# Patient Record
Sex: Female | Born: 1940
Health system: Southern US, Community
[De-identification: ages and names within clinical notes are randomized; demographics above are authoritative.]

## PROBLEM LIST (undated history)

## (undated) DIAGNOSIS — N183 Chronic kidney disease, stage 3 unspecified: Secondary | ICD-10-CM

## (undated) DIAGNOSIS — I10 Essential (primary) hypertension: Secondary | ICD-10-CM

## (undated) DIAGNOSIS — N189 Chronic kidney disease, unspecified: Secondary | ICD-10-CM

## (undated) DIAGNOSIS — T7840XA Allergy, unspecified, initial encounter: Secondary | ICD-10-CM

## (undated) DIAGNOSIS — E785 Hyperlipidemia, unspecified: Secondary | ICD-10-CM

## (undated) DIAGNOSIS — E538 Deficiency of other specified B group vitamins: Secondary | ICD-10-CM

## (undated) DIAGNOSIS — Z955 Presence of coronary angioplasty implant and graft: Secondary | ICD-10-CM

## (undated) DIAGNOSIS — R112 Nausea with vomiting, unspecified: Secondary | ICD-10-CM

## (undated) DIAGNOSIS — J45909 Unspecified asthma, uncomplicated: Secondary | ICD-10-CM

## (undated) DIAGNOSIS — R194 Change in bowel habit: Secondary | ICD-10-CM

## (undated) DIAGNOSIS — T4145XA Adverse effect of unspecified anesthetic, initial encounter: Secondary | ICD-10-CM

## (undated) DIAGNOSIS — I480 Paroxysmal atrial fibrillation: Secondary | ICD-10-CM

## (undated) DIAGNOSIS — G43909 Migraine, unspecified, not intractable, without status migrainosus: Secondary | ICD-10-CM

## (undated) DIAGNOSIS — I219 Acute myocardial infarction, unspecified: Secondary | ICD-10-CM

## (undated) DIAGNOSIS — I209 Angina pectoris, unspecified: Secondary | ICD-10-CM

## (undated) DIAGNOSIS — D649 Anemia, unspecified: Secondary | ICD-10-CM

## (undated) DIAGNOSIS — M1990 Unspecified osteoarthritis, unspecified site: Secondary | ICD-10-CM

## (undated) DIAGNOSIS — Z9889 Other specified postprocedural states: Secondary | ICD-10-CM

## (undated) DIAGNOSIS — R0600 Dyspnea, unspecified: Secondary | ICD-10-CM

## (undated) DIAGNOSIS — J189 Pneumonia, unspecified organism: Secondary | ICD-10-CM

## (undated) DIAGNOSIS — I2510 Atherosclerotic heart disease of native coronary artery without angina pectoris: Secondary | ICD-10-CM

## (undated) DIAGNOSIS — K219 Gastro-esophageal reflux disease without esophagitis: Secondary | ICD-10-CM

## (undated) DIAGNOSIS — T8859XA Other complications of anesthesia, initial encounter: Secondary | ICD-10-CM

## (undated) HISTORY — PX: TOTAL ABDOMINAL HYSTERECTOMY W/ BILATERAL SALPINGOOPHORECTOMY: SHX83

## (undated) HISTORY — DX: Anemia, unspecified: D64.9

## (undated) HISTORY — DX: Allergy, unspecified, initial encounter: T78.40XA

## (undated) HISTORY — DX: Deficiency of other specified B group vitamins: E53.8

## (undated) HISTORY — PX: CHOLECYSTECTOMY: SHX55

## (undated) HISTORY — DX: Migraine, unspecified, not intractable, without status migrainosus: G43.909

## (undated) HISTORY — DX: Gastro-esophageal reflux disease without esophagitis: K21.9

## (undated) HISTORY — DX: Unspecified osteoarthritis, unspecified site: M19.90

## (undated) HISTORY — DX: Essential (primary) hypertension: I10

## (undated) HISTORY — DX: Presence of coronary angioplasty implant and graft: Z95.5

## (undated) HISTORY — DX: Change in bowel habit: R19.4

## (undated) HISTORY — DX: Hyperlipidemia, unspecified: E78.5

## (undated) HISTORY — PX: NASAL SINUS SURGERY: SHX719

---

## 1999-07-07 HISTORY — PX: OTHER SURGICAL HISTORY: SHX169

## 1999-09-17 ENCOUNTER — Encounter: Payer: Self-pay | Admitting: Neurosurgery

## 1999-09-19 ENCOUNTER — Inpatient Hospital Stay (HOSPITAL_COMMUNITY): Admission: RE | Admit: 1999-09-19 | Discharge: 1999-09-23 | Payer: Self-pay | Admitting: Neurosurgery

## 1999-11-04 ENCOUNTER — Encounter: Admission: RE | Admit: 1999-11-04 | Discharge: 1999-11-04 | Payer: Self-pay | Admitting: Neurosurgery

## 1999-11-04 ENCOUNTER — Encounter: Payer: Self-pay | Admitting: Neurosurgery

## 1999-11-11 ENCOUNTER — Encounter: Admission: RE | Admit: 1999-11-11 | Discharge: 1999-11-11 | Payer: Self-pay | Admitting: Neurosurgery

## 1999-11-11 ENCOUNTER — Encounter: Payer: Self-pay | Admitting: Neurosurgery

## 1999-11-28 ENCOUNTER — Encounter: Admission: RE | Admit: 1999-11-28 | Discharge: 1999-11-28 | Payer: Self-pay | Admitting: Neurosurgery

## 1999-11-28 ENCOUNTER — Encounter: Payer: Self-pay | Admitting: Neurosurgery

## 1999-12-09 ENCOUNTER — Other Ambulatory Visit: Admission: RE | Admit: 1999-12-09 | Discharge: 1999-12-09 | Payer: Self-pay | Admitting: Obstetrics and Gynecology

## 2000-07-30 ENCOUNTER — Ambulatory Visit (HOSPITAL_BASED_OUTPATIENT_CLINIC_OR_DEPARTMENT_OTHER): Admission: RE | Admit: 2000-07-30 | Discharge: 2000-07-30 | Payer: Self-pay | Admitting: Otolaryngology

## 2000-10-08 ENCOUNTER — Ambulatory Visit (HOSPITAL_BASED_OUTPATIENT_CLINIC_OR_DEPARTMENT_OTHER): Admission: RE | Admit: 2000-10-08 | Discharge: 2000-10-08 | Payer: Self-pay | Admitting: Otolaryngology

## 2000-12-09 ENCOUNTER — Other Ambulatory Visit: Admission: RE | Admit: 2000-12-09 | Discharge: 2000-12-09 | Payer: Self-pay | Admitting: Obstetrics and Gynecology

## 2002-01-18 ENCOUNTER — Other Ambulatory Visit: Admission: RE | Admit: 2002-01-18 | Discharge: 2002-01-18 | Payer: Self-pay | Admitting: Obstetrics and Gynecology

## 2004-08-28 ENCOUNTER — Ambulatory Visit: Payer: Self-pay | Admitting: Family Medicine

## 2004-10-24 ENCOUNTER — Ambulatory Visit: Payer: Self-pay | Admitting: Family Medicine

## 2004-11-11 ENCOUNTER — Ambulatory Visit: Payer: Self-pay | Admitting: Family Medicine

## 2004-12-23 ENCOUNTER — Ambulatory Visit: Payer: Self-pay | Admitting: Family Medicine

## 2005-04-21 ENCOUNTER — Ambulatory Visit: Payer: Self-pay | Admitting: Family Medicine

## 2005-05-14 ENCOUNTER — Encounter: Admission: RE | Admit: 2005-05-14 | Discharge: 2005-05-14 | Payer: Self-pay | Admitting: Neurology

## 2007-07-07 HISTORY — PX: COLONOSCOPY: SHX174

## 2007-10-04 ENCOUNTER — Ambulatory Visit: Payer: Self-pay | Admitting: Gastroenterology

## 2007-10-19 ENCOUNTER — Ambulatory Visit: Payer: Self-pay | Admitting: Gastroenterology

## 2010-11-18 NOTE — Assessment & Plan Note (Signed)
[facility]                         GASTROENTEROLOGY OFFICE NOTE   NAME:Krystal Mcpherson, Krystal Mcpherson                          MRN:          [id number]  DATE:10/04/2007                            DOB:          [birth year]    REFERRING PHYSICIAN:  Lindaann Pascal PA-C   Tenee Wish REQUESTING CONSULT:  Lindaann Pascal, PA-C   REASON FOR CONSULTATION:  Lower abdominal discomfort.   HISTORY OF PRESENT ILLNESS:  This is a 70 year old white female who  relates worsening problems with lower abdominal pain and pressure that  began approximately one month ago.  She states her symptoms worsened  when she had a full urinary bladder and she also noted nausea.  She was  treated with a course of Septra for 10 days for suspected  diverticulitis, and her symptoms have substantially improved.  She had  constipation at the time, and this resolved quite rapidly.  She relates  no fevers, chills, melena, hematochezia, or change in stool caliber.  Blood work from February 2009 revealed a normal CBC and chemistry panel  except for a slightly low glomerular filtration rate of 56.  She relates  a grandmother, two first cousins, an aunt and one second cousin, all on  the paternal side of her family, all with colon cancer.  Her mother has  had colon polyps.  She states she had a colonoscopy performed greater  than 10 years ago at [facility], and she is unsure of the  results.   PAST MEDICAL HISTORY:  1. Hypertension.  2. PAT.  3. Allergic rhinitis.  4. Left sphenoid sinus fungal infection.  5. Migraine headaches.   PAST SURGICAL HISTORY:  Status post total abdominal hysterectomy for  fibroids in 1988 status post cholecystectomy in 1995, status post  varicose vein stripping in 2008. Chiari I decompression   CURRENT MEDICATIONS:  Listed on the chart, updated and reviewed.   MEDICATION ALLERGIES:  MORPHINE LEADING TO HALLUCINATIONS, NAUSEA AND  VOMITING.   SOCIAL HISTORY/REVIEW OF SYSTEMS:   Per the handwritten form.   PHYSICAL EXAMINATION:  Overweight white female in no acute distress.  Height 5 feet, 7 inches, weight 211.2 pounds.  Blood pressure 144/74,  pulse 56 and regular.  HEENT EXAM:  Anicteric sclerae, oropharynx clear.  CHEST:  Clear to auscultation bilaterally.  CARDIAC:  Regular rate and rhythm without murmurs.  ABDOMEN:  Soft, nontender, nondistended, normoactive bowel sounds.  No  palpable organomegaly, masses or hernias.  RECTAL:  Deferred to colonoscopy.  EXTREMITIES:  No clubbing, cyanosis or edema.  NEUROLOGIC:  Alert and oriented x3.  Grossly nonfocal.   ASSESSMENT/PLAN:  Lower abdominal pain associated with transient  constipation.  She has a strong family history of colon cancer. Since  her symptoms resolved with a course of antibiotics, she may have had  diverticulitis.  Need to exclude colorectal neoplasms.  The risks,  benefits and alternatives to colonoscopy, possible biopsy and possible  polypectomy were discussed with the patient.  She consents to proceed.  This will be scheduled electively.  She is advised to maintain a high-  fiber diet with adequate  fluid intake.     Venita Lick. Russella Dar, MD, [facility]  Electronically Signed    MTS/MedQ  DD: 10/06/2007  DT: 10/06/2007  Job #: [id number]   cc:   Lindaann Pascal PA-C

## 2010-11-21 NOTE — Op Note (Signed)
[place]. [facility]  Patient:    Krystal Mcpherson, Krystal Mcpherson                          MRN: [id number] Proc. Date: 08/26/00 Adm. Date:  [id number] Disc. Date: [id number] Attending:  Lucky Cowboy             Colon Flattery, D.O.   Operative Report  PREOPERATIVE DIAGNOSIS:  Chronic left sphenoid sinusitis, left concha bullosa, septal deviation.  POSTOPERATIVE DIAGNOSIS:  Chronic left sphenoid sinusitis, left concha bullosa, septal deviation.  OPERATION PERFORMED:  Reduction of left concha bullosa, septoplasty, left sphenoidotomy, Insta-Trak guidance.  SURGEON:  Lucky Cowboy, M.D.  ANESTHESIA:  General endotracheal.  ESTIMATED BLOOD LOSS:  30 cc.  SPECIMENS:  None.  COMPLICATIONS:  None.  INDICATIONS FOR PROCEDURE:  The patient is a 70 year old female who has had some issues with recurrent sinusitis.  CT scan reveals thickening in the inferior portion of the left sphenoid sinus with bony sclerosis.  Concern is for possible retained fungal elements.  There is also concern for chronic sphenoid sinusitis.  Since this has failed to resolved with numerous courses of antibiotic as well as steroid therapy, left sphenoidotomy is performed. The patient also has a very severe left septal deviation which does not permit visualization and manipulation of the sphenoid rostrum.  She was also noted to have a prominent left concha bullosa which also did not permit visualization in the area as well.  For this reason, it was taken down.  FINDINGS:  The patient was noted to have marked mucosal thickening at the base of the left sphenoid sinus.  There was no frank pus encountered.  There was a concha bullosa in the left middle turbinate.  The septum was deviated to the superior and midportion to the left side.  DESCRIPTION OF PROCEDURE:  The patient was taken to the operating room and placed on the table in the supine position.  She was then placed under general endotracheal anesthesia  and the table rotated counterclockwise 90 degrees. The Insta-Trak head gear was applied.  The face was prepped with Betadine and draped in the usual sterile fashion.  Each nasal cavity was decongested with Afrin on cottonoid sponges.  The Insta-Trak was then calibrated.  The septum was injected with 1% lidocaine with 1:100,000 epinephrine in the mucoperichondrial and mucoperiosteal planes.  After allowing time for vasoconstriction, a left hemitransfixion incision was made with a #15 blade. Submucopericondrial and mucoperiosteal flaps were elevated ipsilaterally and the septum divided.  The bony cartilaginous junction and a contralateral mucoperiosteal flap elevated using Therapist, nutritional.  The septum was divided high using a Jansen-Middleton forceps and taken down with duckbill forceps in the midportion.  This resected the posterior portion of quadrangular cartilage and anterior portion of vomerine perpendicular plate of the ethmoid.  This allowed visualization of the sphenoid and took down the deviated portion of the septum.  At this point the concha bullosa was taken down after injecting with 1% lidocaine with 1:100,000 epinephrine.  The middle turbinate was then divided in a vertical fashion along the head.  The concha bullosa was entered in the lateral portion and taken down.  This allowed lateralization of the middle turbinate leaving still a good air space between that and the natural maxillary ostia.  The posterior portion of the septum and sphenoid face was then injected with the same local anesthetic.  The natural ostia could not be clearly identified.  Aneta Mins  was used to ensure location into the expected portion of the sphenoid sinus.  The sphenoid sinus was then entered using a suction and a microdebrider and backbiting forceps used to enlarge the natural ostia.   The small amount of fluid in the sinus was then suctioned. Inspection of the sinus was performed using 0 and 30  degree scopes.  Sphenoid osti was widely enlarged.  At this point, the hemitransfixion incision was reapproximated in a simple interrupted fashion, using 4-0 chromic.  A mattress stitch was placed using 4-0 chromic as well.  This was performed in a mattress type fashion leaving a significant amount of mucosa between stitches to ensure mucosal viability.  Bactroban ointment was placed into the left sphenoid sinus.  Packs were placed in the spheno-ethmoid recess.  This was coated with Bactroban ointment.  The nasopharynx and oropharynx was copiously irrigated. The table was rotated clockwise 90 degrees to its original position.  The patient was awakened from anesthesia and extubated in the operating room.  She was taken to the post anesthesia care unit in stable condition.  There were no complications.  Return appointment tomorrow morning for pack removal.  DISCHARGE MEDICATIONS:  Ceftin 250 mg b.i.d. x 10 days and Darvocet-N 100 1 to 2 p.o. q.4-6h. p.r.n. pain. DD:  08/26/00 TD:  08/26/00 Job: [id number]

## 2010-11-21 NOTE — Op Note (Signed)
[place]. [facility]  Patient:    Krystal Mcpherson, Krystal Mcpherson                          MRN: [id number] Proc. Date: 10/08/00 Adm. Date:  [id number] Disc. Date: [id number] Attending:  Gerilyn Pilgrim, Sera                           Operative Report  PREOPERATIVE DIAGNOSIS:  Chronic left sphenoid sinusitis.  POSTOPERATIVE DIAGNOSIS:  Chronic left sphenoid sinusitis.  PROCEDURE:  Debridement of left sphenoid sinus with removal of fungal mass.  SURGEON:  Lucky Cowboy, M.D.  ANESTHESIA:  General endotracheal anesthesia.  ESTIMATED BLOOD LOSS:  10 cc.  SPECIMENS:  None.  COMPLICATIONS:  None.  INDICATIONS:  The patient is a 70 year old female who has previously undergone left sphenoidotomy for chronic fungal sphenoid sinusitis.  The patient initially improved; however, she developed sinusitis again.  Inspection in the office revealed a fungal element very posterior and inferior in the sphenoid sinus, which could not be accessed and cleaned out adequately without general anesthesia and debridement, taking down the anterior sphenoid face.  DESCRIPTION OF PROCEDURE:  The patient was taken to the operating room and placed on the table in supine position.  She was then placed under general endotracheal anesthesia and the table rotated counterclockwise 90 degrees. The left nasal cavity was decongested with Afrin and lidocaine.  Lidocaine 1% with 1:100,000 of epinephrine was used to inject the left sphenoid face.  At this point, the [place] back-biting forceps and the microdebrider were used to take down an inferior lip of the sphenoidotomy site.  This allowed retraction of a large, hard fungal ball in the posterior inferior portion of the sphenoid sinus.  There was moderate edema and some mucus in the left sphenoid sinus.  The left sphenoid sinus was then packed full of Bactroban ointment.  The table was rotated clockwise 90 degrees to its original position.  The patient was awakened from  anesthesia, taken to the postanesthesia care unit in stable condition. DD:  11/10/00 TD:  11/11/00 Job: [id number]

## 2010-11-21 NOTE — Op Note (Signed)
[place]. [facility]  Patient:    Krystal Mcpherson, Krystal Mcpherson                          MRN: [id number] Proc. Date: 09/19/99 Adm. Date:  [id number] Attending:  Emeterio Reeve                           Operative Report  PREOPERATIVE DIAGNOSIS:  Chiari type I malformation.  POSTOPERATIVE DIAGNOSIS:  Chiari type I malformation.  OPERATIVE PROCEDURE:  Chiari type I decompression.  SURGEON:  Payton Doughty, M.D.  ANESTHESIA:  General endotracheal.  PREPARATION:  Prepped with sterile Betadine, prepped and scrubbed with alcohol wipe.  COMPLICATIONS:  None.  INDICATIONS:  A 70 year old right-handed white lady with Chiari I malformation.  DESCRIPTION OF PROCEDURE:  The patient was taken to the operating room, smoothly anesthetized and intubated, and placed prone on the operating table in the [place] head holder.  Following shave, prep, and drape in the usual sterile fashion, the skin was infiltrated with 1% lidocaine with 1:400,000 epinephrine.  A skin incision was made from the ________.  The occipital bone in the posterior fossa, as well as the ring of C1 and the lamina of C2 were exposed.  There was no real suitable fascia for grafting, so there was no fascia taken.  The ring of C1 and the occipital bone around the foramen magnum was removed, the foramen magnum opened.  A tight constrictive ring of ligamentum flavum was found under the ring of C1, and that was removed without difficulty.  The dura was then opened.  Having opened he dura, the cerebellar tonsils were immediately obvious, and careful exploration nto the _______ revealed good egress of spinal fluid.  Duraplasty was then performed using the _______ on pericardium.  It was sewn into place with 4-0 Nurolon.  Having completed the duraplasty, it was found to be watertight to Valsalva. The fascia was then reapproximated with 0 Vicryl in an interrupted fashion. Subcutaneous tissue was reapproximated with  0 Vicryl, subcuticular tissues reapproximated with 3-0 Vicryl in an interrupted fashion, and the skin was closed with 3-0 nylon in a running locked fashion.  Betadine Telfa dressing was applied and made occlusive with Op-Site.  The patient was returned to the recovery room in good condition. DD:  09/19/99 TD:  09/21/99 Job: [id number]

## 2010-11-21 NOTE — Discharge Summary (Signed)
[place]. [facility]  Patient:    Krystal Mcpherson, Krystal Mcpherson                          MRN: [id number] Adm. Date:  [id number] Disc. Date: [id number] Attending:  Emeterio Reeve                           Discharge Summary  NO DICTATION DD:  09/23/99 TD:  09/23/99 Job: [id number]

## 2010-11-21 NOTE — Discharge Summary (Signed)
[place]. [facility]  Patient:    Krystal Mcpherson, Krystal Mcpherson                          MRN: [id number] Proc. Date: 09/23/99 Adm. Date:  [id number] Disc. Date: [id number] Attending:  Emeterio Reeve                           Discharge Summary  ADMITTING DIAGNOSIS:  Chiari Type I malformation.  DISCHARGE DIAGNOSIS:  Chiari Type I malformation.  OPERATIVE PROCEDURE:  Posterior fossa craniectomy and duraplasty for Chiari decompression.  SURGEON:  Payton Doughty, M.D.  COMPLICATIONS:  None.  DISCHARGE:  ______ .  BODY OF TEXT:  A 70 year old right-handed, white lady, whose history and physical is recounted in the chart.  She has had headaches for a while and neck pain.  MRI showed a Chiari Type I malformation.  Her examination was intact save for circumoral numbness.  She was admitted after ascertaining normal laboratory values and underwent a Chiari decompression. Postoperatively, she did well.  She had nausea and vomiting for several days as is common.  On the third postoperative day, she was transferred to the floor, awake, alert and oriented.  She had one headache, which resolved.  She is now awake, alert and oriented.  Her cranial nerves are intact.  Motor examination is intact and she is eating and voiding normally.  She is being discharged home to the care of her family.  She is taking Percocet for pain, as well as, her admitting medicines. DD:  09/23/99 TD:  09/23/99 Job: [id number]

## 2011-05-07 ENCOUNTER — Inpatient Hospital Stay (HOSPITAL_BASED_OUTPATIENT_CLINIC_OR_DEPARTMENT_OTHER)
Admission: RE | Admit: 2011-05-07 | Discharge: 2011-05-07 | Disposition: A | Discharge status: Hospice | Payer: Medicare Other | Source: Ambulatory Visit | Attending: Cardiology | Admitting: Cardiology

## 2011-05-07 ENCOUNTER — Ambulatory Visit (HOSPITAL_COMMUNITY)
Admission: RE | Admit: 2011-05-07 | Discharge: 2011-05-08 | Disposition: A | Discharge status: Home / Self Care | Payer: Medicare Other | Source: Ambulatory Visit | Attending: Interventional Cardiology | Admitting: Interventional Cardiology

## 2011-05-07 DIAGNOSIS — R9439 Abnormal result of other cardiovascular function study: Secondary | ICD-10-CM | POA: Insufficient documentation

## 2011-05-07 DIAGNOSIS — I10 Essential (primary) hypertension: Secondary | ICD-10-CM | POA: Insufficient documentation

## 2011-05-07 DIAGNOSIS — I2510 Atherosclerotic heart disease of native coronary artery without angina pectoris: Secondary | ICD-10-CM | POA: Insufficient documentation

## 2011-05-07 DIAGNOSIS — Z8711 Personal history of peptic ulcer disease: Secondary | ICD-10-CM | POA: Insufficient documentation

## 2011-05-07 DIAGNOSIS — Z7982 Long term (current) use of aspirin: Secondary | ICD-10-CM | POA: Insufficient documentation

## 2011-05-07 DIAGNOSIS — Z79899 Other long term (current) drug therapy: Secondary | ICD-10-CM | POA: Insufficient documentation

## 2011-05-07 HISTORY — PX: OTHER SURGICAL HISTORY: SHX169

## 2011-05-07 LAB — POCT ACTIVATED CLOTTING TIME: Activated Clotting Time: 474 seconds

## 2011-05-08 ENCOUNTER — Other Ambulatory Visit: Payer: Self-pay

## 2011-05-08 LAB — CBC
HCT: 34.4 % — ABNORMAL LOW (ref 36.0–46.0)
Hemoglobin: 11 g/dL — ABNORMAL LOW (ref 12.0–15.0)
MCH: 27 pg (ref 26.0–34.0)
MCHC: 32 g/dL (ref 30.0–36.0)
MCV: 84.5 fL (ref 78.0–100.0)
Platelets: 157 10*3/uL (ref 150–400)
RBC: 4.07 MIL/uL (ref 3.87–5.11)
RDW: 13.2 % (ref 11.5–15.5)
WBC: 4.5 10*3/uL (ref 4.0–10.5)

## 2011-05-08 LAB — BASIC METABOLIC PANEL
BUN: 13 mg/dL (ref 6–23)
CO2: 27 mEq/L (ref 19–32)
Calcium: 8.8 mg/dL (ref 8.4–10.5)
Chloride: 103 mEq/L (ref 96–112)
Creatinine, Ser: 0.8 mg/dL (ref 0.50–1.10)
GFR calc Af Amer: 86 mL/min — ABNORMAL LOW (ref >90)
GFR calc non Af Amer: 75 mL/min — ABNORMAL LOW (ref >90)
Glucose, Bld: 104 mg/dL — ABNORMAL HIGH (ref 70–99)
Potassium: 3.5 mEq/L (ref 3.5–5.1)
Sodium: 140 mEq/L (ref 135–145)

## 2011-05-08 NOTE — Cardiovascular Report (Signed)
  NAMEMarland Kitchen  SAMANTHA, RAGEN NO.:  [id number]  MEDICAL RECORD NO.:  [id number]  LOCATION:  2505                         FACILITY:  [facility]  PHYSICIAN:  Corky Crafts, MDDATE OF BIRTH:  1941/01/03  DATE OF PROCEDURE:  05/07/2011 DATE OF DISCHARGE:                           CARDIAC CATHETERIZATION   PRIMARY CARDIOLOGIST:  Jake Bathe, MD  PRIMARY CARE PHYSICIAN:  Kandyce Rud, MD.  PROCEDURE PERFORMED:  PCI of the right coronary artery.  OPERATORS:  Corky Crafts, MD.  INDICATIONS:  Abnormal stress test, severe coronary artery disease.  PROCEDURE NARRATIVE:  The diagnostic catheterization was done by Dr. Anne Fu showing a 99% RCA lesion with TIMI 1 flow.  A JR-4 guiding catheter was used.  Angiomax was used for anticoagulation.  Prowater wire was used to cross the lesion.  A 2.0 x 15 Emerge balloon was used to predilate the lesion.  There was a more distal area of moderate stenosis.  This stenosis resolved with IC Nitroglycerin administration through the catheter. A 2.5 x 20 Promus drug-eluting stent was used to stent the previously angioplastied area, it was deployed at 18 atmospheres.  The stent was post dilated with 3.25 x 12 [place] Sprinter balloon inflated at 16 atmospheres in the mid portion of the stent and 18 atmospheres in the proximal portion.  There is no residual stenosis. Initial TIMI 1 flow improved to TIMI-3 flow.  IMPRESSION:  Successful percutaneous coronary intervention to the right coronary artery with a drug-eluting stent.  RECOMMENDATIONS:  Continue aspirin and Plavix for at least a year, unless there is a contraindication.  She will also be started on a proton pump inhibitor.  She will also be started on a statin.     Corky Crafts, MD     JSV/MEDQ  D:  05/07/2011  T:  05/07/2011  Job:  [id number]  Electronically Signed by Lance Muss MD on 05/08/2011 05:39:06 PM

## 2011-05-08 NOTE — Discharge Summary (Signed)
  Krystal Mcpherson, Krystal Mcpherson NO.:  [id number]  MEDICAL RECORD NO.:  [id number]  LOCATION:  2505                         FACILITY:  [facility]  PHYSICIAN:  Corky Crafts, MDDATE OF BIRTH:  Apr 13, 1941  DATE OF ADMISSION:  05/07/2011 DATE OF DISCHARGE:  05/08/2011                              DISCHARGE SUMMARY   PRIMARY CARDIOLOGIST:  Jake Bathe, MD  PRIMARY CARE PHYSICIAN:  Kandyce Rud, MD  FINAL DIAGNOSES: 1. Coronary artery disease. 2. Hypertension. 3. Prior gastric ulcer.  PROCEDURES PERFORMED:  Cardiac catheterization with drug-eluting stent implantation to the right coronary artery.  HOSPITAL COURSE:  The patient underwent diagnostic catheterization by Dr. Anne Fu.  This revealed a 99% subtotal occlusion of the mid right coronary artery.  There were left-to-right collaterals.  She underwent percutaneous intervention and had a drug-eluting stent placed into the right coronary artery successfully.  She tolerated the procedure well. She had no significant groin bleeding issues.  There was a small bruise on the morning after the procedure.  She walked with Cardiac rehab and had no difficulties.  Her lab work was stable.  DISCHARGE MEDICATIONS: 1. Tylenol 650 mg p.o. q.4 h. p.r.n. 2. Aspirin 325 mg p.o. daily. 3. Clopidogrel 75 mg p.o. daily. 4. Pantoprazole 40 mg p.o. daily. 5. Simvastatin 20 mg p.o. daily. 6. Atenolol 25 mg daily. 7. Calcium carbonate twice daily. 8. Cetirizine 10 mg daily. 9. Losartan/hydrochlorothiazide 50/12.5 one tablet daily.  ACTIVITY:  Increase activity slowly.  No lifting more than 10 pounds for a week.  DIET:  Low-sodium, heart healthy diet.  FOLLOWUP:  Followup appointments with Dr. Anne Fu and Cristopher Peru on May 18, 2011, at 9:45 a.m.  She is to call our office if she has any chest discomfort or problems with her groin.     Corky Crafts, MD     JSV/MEDQ  D:  05/08/2011  T:  05/08/2011  Job:   [id number]  cc:   Jake Bathe, MD Kandyce Rud, MD  Electronically Signed by Lance Muss MD on 05/08/2011 05:39:11 PM

## 2011-05-13 NOTE — Cardiovascular Report (Signed)
NAMEMarland Kitchen  Mcpherson, Krystal NO.:  [id number]  MEDICAL RECORD NO.:  [id number]  LOCATION:  2505                         FACILITY:  [facility]  PHYSICIAN:  Jake Bathe, MD      DATE OF BIRTH:  1940-12-20  DATE OF PROCEDURE:  05/07/2011 DATE OF DISCHARGE:                           CARDIAC CATHETERIZATION   INDICATIONS:  [name] is a 70 year old female who recently underwent an exercise treadmill test with diffuse ST segment depression, symptomatic with chest burning and throat, radiation to the neck and jaw.  No prior cardiovascular history.  She was brought to the catheterization lab for further diagnosis.  PROCEDURE DETAILS:  Informed consent was obtained.  Risk of stroke, heart attack, death, renal impairment, arterial damage, bleeding were explained to the patient at length.  Alternatives to treatment were discussed.  Questions were answered.  Lidocaine 1% was used for local anesthesia after visualization of the femoral head via fluoroscopy.  A 4- French sheath was inserted in the right femoral artery without difficulty.  A no torque Williams right catheter was used to selectively cannulate the coronary arteries and multiple views with hand injection of Omnipaque were obtained.  An angled pigtail was then used to cross the left ventricle and a left ventriculogram utilizing 25 mL of contrast in the RAO position was performed.  FINDINGS: 1. Left main is short/dual ostia.  No angiographically significant     disease. 2. Circumflex artery demonstrates a 3 significant and highly tortuous     obtuse marginal branches with only minor luminal irregularities, 1     noticeable area in the second obtuse marginal branch of     approximately 20%. 3. LAD.  This vessel gives rise to 1 significant diagonal branch.     This diagonal branch is quite small in caliber and has     approximately a 70% ostial stenosis.  This would not be amenable to     PCI.  The LAD demonstrates  minor luminal irregularity at the septal     perforator branch point of approximately 20% stenosis.  There are     ample left-to-right collaterals from the septal perforators/LAD to     the right coronary artery noted 4. Right coronary artery of the dominant vessel giving rise to the     posterior descending artery and there is a 100% occlusion     subtotaled/appears semi-chronic with beaking. 5. Left ventriculogram.  Left ventricular ejection fraction 55-60%     with mild-to-moderate hypokinesis of the inferior wall     distribution.  Ascending aorta appears normal.  No significant     mitral regurgitation.  IMPRESSION: 1. Subtotal/occluded proximal right coronary artery with left to right     collateralization and beaking of the segment.  Possibly amenable to     percutaneous intervention. 2. Dual ostia left anterior descending and circumflex with only minor     luminal irregularities in these vessels. 3. Normal left ventricular ejection fraction of 55% with moderate     hypokinesis of the inferior wall distribution.  No mitral     regurgitation.  No aortic stenosis.  PLAN:  Findings have been  discussed with the patient as well as Dr. Eldridge Dace.  We will take her upstairs to the main cath lab to perform percutaneous intervention of the right coronary artery or at least to attempt this.  Hopefully this will be successful.  We will observe her overnight.     Jake Bathe, MD     MCS/MEDQ  D:  05/07/2011  T:  05/08/2011  Job:  [id number]  cc:   Kandyce Rud, MD  Electronically Signed by Donato Schultz MD on 05/13/2011 06:50:53 AM

## 2012-08-05 ENCOUNTER — Other Ambulatory Visit: Payer: Self-pay | Admitting: Obstetrics and Gynecology

## 2012-08-05 DIAGNOSIS — Z1231 Encounter for screening mammogram for malignant neoplasm of breast: Secondary | ICD-10-CM

## 2012-09-01 ENCOUNTER — Ambulatory Visit
Admission: RE | Admit: 2012-09-01 | Discharge: 2012-09-01 | Disposition: A | Discharge status: Home / Self Care | Payer: Medicare Other | Source: Ambulatory Visit | Attending: Obstetrics and Gynecology | Admitting: Obstetrics and Gynecology

## 2012-09-01 DIAGNOSIS — Z1231 Encounter for screening mammogram for malignant neoplasm of breast: Secondary | ICD-10-CM

## 2012-10-10 ENCOUNTER — Encounter: Payer: Self-pay | Admitting: *Deleted

## 2012-10-13 ENCOUNTER — Encounter: Payer: Self-pay | Admitting: Gastroenterology

## 2013-04-11 ENCOUNTER — Other Ambulatory Visit: Payer: Self-pay | Admitting: Obstetrics and Gynecology

## 2013-04-11 DIAGNOSIS — N644 Mastodynia: Secondary | ICD-10-CM

## 2013-04-14 ENCOUNTER — Ambulatory Visit
Admission: RE | Admit: 2013-04-14 | Discharge: 2013-04-14 | Disposition: A | Discharge status: Home / Self Care | Payer: Medicare Other | Source: Ambulatory Visit | Attending: Obstetrics and Gynecology | Admitting: Obstetrics and Gynecology

## 2013-04-14 DIAGNOSIS — N644 Mastodynia: Secondary | ICD-10-CM

## 2013-07-10 ENCOUNTER — Other Ambulatory Visit: Payer: Self-pay

## 2013-07-10 DIAGNOSIS — Z1231 Encounter for screening mammogram for malignant neoplasm of breast: Secondary | ICD-10-CM

## 2013-09-04 ENCOUNTER — Ambulatory Visit
Admission: RE | Admit: 2013-09-04 | Discharge: 2013-09-04 | Disposition: A | Discharge status: Home / Self Care | Payer: Medicare Other | Source: Ambulatory Visit

## 2013-09-04 DIAGNOSIS — Z1231 Encounter for screening mammogram for malignant neoplasm of breast: Secondary | ICD-10-CM

## 2014-01-08 ENCOUNTER — Encounter: Payer: Self-pay | Admitting: Cardiology

## 2014-01-26 ENCOUNTER — Encounter: Payer: Self-pay | Admitting: *Deleted

## 2014-02-13 ENCOUNTER — Ambulatory Visit: Payer: Medicare Other | Admitting: Cardiology

## 2014-03-02 ENCOUNTER — Other Ambulatory Visit: Payer: Self-pay | Admitting: *Deleted

## 2014-03-02 ENCOUNTER — Ambulatory Visit (INDEPENDENT_AMBULATORY_CARE_PROVIDER_SITE_OTHER): Payer: Medicare Other | Admitting: Cardiology

## 2014-03-02 ENCOUNTER — Encounter: Payer: Self-pay | Admitting: Cardiology

## 2014-03-02 VITALS — BP 124/86 | HR 50 | Ht 66.0 in | Wt 210.0 lb

## 2014-03-02 DIAGNOSIS — I498 Other specified cardiac arrhythmias: Secondary | ICD-10-CM

## 2014-03-02 DIAGNOSIS — I2510 Atherosclerotic heart disease of native coronary artery without angina pectoris: Secondary | ICD-10-CM | POA: Insufficient documentation

## 2014-03-02 DIAGNOSIS — I10 Essential (primary) hypertension: Secondary | ICD-10-CM | POA: Insufficient documentation

## 2014-03-02 DIAGNOSIS — R001 Bradycardia, unspecified: Secondary | ICD-10-CM | POA: Insufficient documentation

## 2014-03-02 MED ORDER — NITROGLYCERIN 0.4 MG SL SUBL: 0.4000 mg | SUBLINGUAL_TABLET | SUBLINGUAL | Status: DC | PRN

## 2014-03-02 NOTE — Progress Notes (Signed)
[facility]. [address], [place]  [address] Phone: [phone] Fax:  [phone]  Date:  03/02/2014   ID:  Shariah, Assad 1940/10/20, MRN [id number]  PCP:  No primary provider on file.   History of Present Illness: Krystal Mcpherson is a 73 y.o. female with coronary artery disease status post percutaneous intervention to the right coronary artery in November of 2012 with overall normal ejection fraction, moderate hypokinesis of the inferior wall here for followup. For 2 months prior to stent had burning CP, GERD like, left arm.   Previously she felt jittery ago she takes her atenolol in the morning. In February of 2013, nuclear is low risk. Her 24-hour Holter monitor showed paroxysmal atrial tachycardia. She has had musculoskeletal chest discomfort. Doing yard work, no symptoms. 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     Wt Readings from Last 3 Encounters:  03/02/14 210 lb (95.255 kg)     Past Medical History  Diagnosis Date  . Vitamin B12 deficiency   . Migraine   . HTN (hypertension)   . HLD (hyperlipidemia)     No past surgical history on file.  Current Outpatient Prescriptions  Medication Sig Dispense Refill  . albuterol (PROVENTIL HFA;VENTOLIN HFA) 108 (90 BASE) MCG/ACT inhaler Inhale 2 puffs into the lungs every 4 (four) hours as needed for wheezing or shortness of breath.      Marland Kitchen aspirin 81 MG tablet Take 81 mg by mouth daily.      Marland Kitchen atenolol (TENORMIN) 25 MG tablet Take 12.5 mg by mouth daily.       . cetirizine (ZYRTEC) 10 MG tablet Take 10 mg by mouth daily.      . diphenhydrAMINE (BENADRYL) 25 mg capsule Take 25 mg by mouth every 6 (six) hours as needed.      . fluticasone (FLONASE) 50 MCG/ACT nasal spray Place 2 sprays into both nostrils as needed for allergies or rhinitis.      Marland Kitchen losartan-hydrochlorothiazide (HYZAAR) 100-12.5 MG per tablet Take 1 tablet by mouth daily.      . nitroGLYCERIN (NITROSTAT) 0.4 MG SL tablet Place 0.4 mg under the tongue every 5 (five)  minutes as needed for chest pain.       No current facility-administered medications for this visit.    Allergies:    Allergies  Allergen Reactions  . Atenolol     Increased dosages cause bradycardia    . Atorvastatin     Muscle pain   . Augmentin [Amoxicillin-Pot Clavulanate]     GI   . Fentanyl     Itching all over   . Morphine And Related     Hallucinations   . Tetanus Toxoids     Localized knot     Social History:  The patient  reports that she has never smoked. She does not have any smokeless tobacco history on file.   No family history on file.Mother and Father both had MI  ROS:  Please see the history of present illness.  She is having hot flashes. Seeing physicians for this. Denies any fevers, chills, orthopnea, PND  All other systems reviewed and negative.   PHYSICAL EXAM: VS:  BP 124/86  Pulse 50  Ht 5\' 6"  (1.676 m)  Wt 210 lb (95.255 kg)  BMI 33.91 kg/m2 Well nourished, well developed, in no acute distress HEENT: normal, [place]/AT, EOMI Neck: no JVD, normal carotid upstroke, no bruit Cardiac:  normal S1, S2; RRR; no murmur Lungs:  clear to auscultation bilaterally, no wheezing, rhonchi or rales Abd: soft, nontender, no hepatomegaly, no bruits Ext: no edema, 2+ distal pulses Skin: warm and dry GU: deferred Neuro: no focal abnormalities noted, AAO x 3  EKG:  03/02/14-    sinus bradycardia rate 50 with no other abnormalities   ASSESSMENT AND PLAN:  1. Coronary artery disease-currently stable, no exertional anginal symptoms. Secondary prevention. 2. History right coronary artery stent. Stable. We discussed secondary prevention with statin use. She was able to tolerate pravastatin the past however she does not wish to take this medication because of reports of association with Alzheimer's. Tried to reassure her but she still does not wish to take medication. 3. Bradycardia - she is continuing low-dose atenolol. No increase. 4. Hypertension-currently well  controlled, medications reviewed 5. One-year followup  Signed, Candee Furbish, MD [facility]  03/02/2014 9:04 AM

## 2014-03-02 NOTE — Patient Instructions (Signed)
The current medical regimen is effective;  continue present plan and medications.  Follow up in 1 year with Dr Skains.  You will receive a letter in the mail 2 months before you are due.  Please call us when you receive this letter to schedule your follow up appointment.  

## 2014-08-01 ENCOUNTER — Ambulatory Visit (INDEPENDENT_AMBULATORY_CARE_PROVIDER_SITE_OTHER): Payer: Medicare Other | Admitting: Cardiology

## 2014-08-01 ENCOUNTER — Encounter: Payer: Self-pay | Admitting: Cardiology

## 2014-08-01 VITALS — BP 128/72 | HR 56 | Ht 66.0 in | Wt 209.0 lb

## 2014-08-01 DIAGNOSIS — I2510 Atherosclerotic heart disease of native coronary artery without angina pectoris: Secondary | ICD-10-CM

## 2014-08-01 DIAGNOSIS — I10 Essential (primary) hypertension: Secondary | ICD-10-CM

## 2014-08-01 DIAGNOSIS — R001 Bradycardia, unspecified: Secondary | ICD-10-CM

## 2014-08-01 DIAGNOSIS — I208 Other forms of angina pectoris: Secondary | ICD-10-CM

## 2014-08-01 NOTE — Progress Notes (Signed)
[facility]. [address] [place], [place]  [address] Phone: [phone] Fax:  [phone]  Date:  08/01/2014   ID:  Teffany, Blaszczyk [birth year], MRN [id number]  PCP:  No primary care provider on file.   History of Present Illness: Krystal Mcpherson is a 74 y.o. female with coronary artery disease status post percutaneous intervention to the right coronary artery in November of 2012 with overall normal ejection fraction, moderate hypokinesis of the inferior wall here for followup. For 2 months prior to stent had burning CP, GERD like, left arm.   Previously she felt jittery ago she takes her atenolol in the morning. In February of 2013, nuclear is low risk. Her 24-hour Holter monitor showed paroxysmal atrial tachycardia. She has had musculoskeletal chest discomfort. Doing yard work, no symptoms. 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   08/01/14 - feels more CP, sharp. BP is up. Dr. Zadie Rhine recently saw her. Last night steady dull. ECG normal. Thought it was GERD at first. Burp sometimes help. Hurts in front and go to back. Sometimes hurts behind right shoulder blade. Lasted 1 hr. NSAIDS helped. NTG refilled. Been coughing a lot as well. 3 incidents where wheeze took place. Needs to use albuterol     Wt Readings from Last 3 Encounters:  08/01/14 209 lb (94.802 kg)  03/02/14 210 lb (95.255 kg)     Past Medical History  Diagnosis Date  . Vitamin B12 deficiency   . Migraine   . HTN (hypertension)   . HLD (hyperlipidemia)     No past surgical history on file.  Current Outpatient Prescriptions  Medication Sig Dispense Refill  . albuterol (PROVENTIL HFA;VENTOLIN HFA) 108 (90 BASE) MCG/ACT inhaler Inhale 2 puffs into the lungs every 4 (four) hours as needed for wheezing or shortness of breath.    Marland Kitchen aspirin 81 MG tablet Take 81 mg by mouth daily.    Marland Kitchen atenolol (TENORMIN) 25 MG tablet Take 12.5 mg by mouth daily.     . cetirizine (ZYRTEC) 10 MG tablet Take 10 mg by mouth daily.    . diphenhydrAMINE  (BENADRYL) 25 mg capsule Take 25 mg by mouth every 6 (six) hours as needed.    . fluticasone (FLONASE) 50 MCG/ACT nasal spray Place 2 sprays into both nostrils as needed for allergies or rhinitis.    Marland Kitchen losartan-hydrochlorothiazide (HYZAAR) 100-12.5 MG per tablet Take 1 tablet by mouth daily.    . nitroGLYCERIN (NITROSTAT) 0.4 MG SL tablet Place 1 tablet (0.4 mg total) under the tongue every 5 (five) minutes as needed for chest pain. 25 tablet prn   No current facility-administered medications for this visit.    Allergies:    Allergies  Allergen Reactions  . Atenolol     Increased dosages cause bradycardia    . Atorvastatin     Muscle pain   . Augmentin [Amoxicillin-Pot Clavulanate]     GI   . Fentanyl     Itching all over   . Morphine And Related     Hallucinations   . Tetanus Toxoids     Localized knot     Social History:  The patient  reports that she has never smoked. She does not have any smokeless tobacco history on file.   No family history on file.Mother and Father both had MI  ROS:  Please see the history of present illness.  She is having hot flashes. Seeing physicians for this. Denies any fevers, chills, orthopnea, PND  All  other systems reviewed and negative.   PHYSICAL EXAM: VS:  BP 128/72 mmHg  Pulse 56  Ht 5\' 6"  (1.676 m)  Wt 209 lb (94.802 kg)  BMI 33.75 kg/m2 Well nourished, well developed, in no acute distress HEENT: normal, [place]/AT, EOMI Neck: no JVD, normal carotid upstroke, no bruit Cardiac:  normal S1, S2; RRR; no murmur Lungs:  clear to auscultation bilaterally, no wheezing, rhonchi or rales Abd: soft, nontender, no hepatomegaly, no bruits Ext: no edema, 2+ distal pulses Skin: warm and dry GU: deferred Neuro: no focal abnormalities noted, AAO x 3  EKG:  08/01/14-sinus rhythm, 56, prior 03/02/14-    sinus bradycardia rate 50 with no other abnormalities   ASSESSMENT AND PLAN:  1. Coronary artery currently describing symptoms, somewhat atypical,  difficult to discern between GERD, MSK, heart. She thinks it may feel somewhat like she felt prior to her RCA stent. Prior nuclear stress test in 2013 was low risk. We will go ahead and set her up for another stress test. She has nitroglycerin if necessary. No room to increase her atenolol because of bradycardia. If symptoms worsen or become more worrisome, emergency room. Secondary prevention. 2. History right coronary artery stent.  We discussed secondary prevention with statin use. She was able to tolerate pravastatin the past however she does not wish to take this medication because of reports of association with Alzheimer's. Tried to reassure her but she still does not wish to take medication. 3. Bradycardia - she is continuing low-dose atenolol. No increase. 4. Hypertension-currently well controlled, medications reviewed 5. 6 month follow-up  Signed, Candee Furbish, MD [facility]  08/01/2014 3:54 PM

## 2014-08-01 NOTE — Patient Instructions (Signed)
Dr. Marlou Porch recommends you have a NUCLEAR STRESS TEST.  Your physician recommends that you continue on your current medications as directed. Please refer to the Current Medication list given to you today.  Your physician wants you to follow-up in: 6 months with Dr. Marlou Porch. You will receive a reminder letter in the mail two months in advance. If you don't receive a letter, please call our office to schedule the follow-up appointment.

## 2014-08-06 ENCOUNTER — Ambulatory Visit (HOSPITAL_COMMUNITY): Payer: Medicare Other | Attending: Cardiovascular Disease | Admitting: Radiology

## 2014-08-06 DIAGNOSIS — R079 Chest pain, unspecified: Secondary | ICD-10-CM | POA: Diagnosis present

## 2014-08-06 DIAGNOSIS — R0600 Dyspnea, unspecified: Secondary | ICD-10-CM

## 2014-08-06 DIAGNOSIS — I208 Other forms of angina pectoris: Secondary | ICD-10-CM

## 2014-08-06 DIAGNOSIS — R002 Palpitations: Secondary | ICD-10-CM | POA: Diagnosis not present

## 2014-08-06 DIAGNOSIS — I10 Essential (primary) hypertension: Secondary | ICD-10-CM | POA: Insufficient documentation

## 2014-08-06 DIAGNOSIS — I4940 Unspecified premature depolarization: Secondary | ICD-10-CM

## 2014-08-06 DIAGNOSIS — R9431 Abnormal electrocardiogram [ECG] [EKG]: Secondary | ICD-10-CM

## 2014-08-06 MED ORDER — TECHNETIUM TC 99M SESTAMIBI GENERIC - CARDIOLITE
10.0000 | Freq: Once | INTRAVENOUS | Status: AC | PRN
  Administered 2014-08-06: 10 via INTRAVENOUS

## 2014-08-06 MED ORDER — TECHNETIUM TC 99M SESTAMIBI GENERIC - CARDIOLITE
30.0000 | Freq: Once | INTRAVENOUS | Status: AC | PRN
  Administered 2014-08-06: 30 via INTRAVENOUS

## 2014-08-06 NOTE — Progress Notes (Signed)
[facility] [address] [place], [place] [address] [phone]    Cardiology Nuclear Med Study  Krystal Mcpherson is a 74 y.o. female     MRN : [id number]     DOB: 04-07-1941  Procedure Date: 08/06/2014  Nuclear Med Background Indication for Stress Test:  Evaluation for Ischemia and Stent Patency History:  Stent;13 MPI:Low risk;HTN Cardiac Risk Factors: Hypertension  Symptoms:  Chest Pain (last date of chest discomfort 2 days ago), Palpitations and Woozy    Nuclear Pre-Procedure Caffeine/Decaff Intake:  None> 12 hrs NPO After: 8:00pm   Lungs:  clear O2 Sat: 98% on room air. IV 0.9% NS with Angio Cath:  22g  IV Site: R Antecubital x 1, tolerated well IV Started by:  Irven Baltimore, RN  Chest Size (in):  38 Cup Size: C  Height: 5\' 6"  (1.676 m)  Weight:  206 lb (93.441 kg)  BMI:  Body mass index is 33.27 kg/(m^2). Tech Comments:  Patient held Atenolol x 48 hrs. Irven Baltimore, RN.    Nuclear Med Study 1 or 2 day study: 1 day  Stress Test Type:  Stress  Reading MD: N/A  Order Authorizing Provider:  Candee Furbish, MD  Resting Radionuclide: Technetium 37m Sestamibi  Resting Radionuclide Dose: 11.0 mCi   Stress Radionuclide:  Technetium 3m Sestamibi  Stress Radionuclide Dose: 33.0 mCi           Stress Protocol Rest HR: 64 Stress HR: 150  Rest BP: 107/58 Stress BP: 189/69  Exercise Time (min): 7:31 METS: 7:00   Predicted Max HR: 149 bpm % Max HR: 100.67 bpm Rate Pressure Product: 28350   Dose of Adenosine (mg):  n/a Dose of Lexiscan: 0.4 mg  Dose of Atropine (mg): n/a Dose of Dobutamine: n/a mcg/kg/min (at max HR)  Stress Test Technologist: Matilde Haymaker, RN  Nuclear Technologist:  Earl Many, CNMT     Rest Procedure:  Myocardial perfusion imaging was performed at rest 45 minutes following the intravenous administration of Technetium 9m Sestamibi. Rest ECG: NSR - Normal EKG  Stress Procedure:  The patient exercised on the treadmill  utilizing the Bruce Protocol for 7:31 minutes. The patient stopped due to chest pain 7/10.  Technetium 61m Sestamibi was injected at peak exercise and myocardial perfusion imaging was performed after a brief delay. Stress ECG: Significant ST abnormalities consistent with ischemia.  There is 1.5-2.0 mm horizontal ST depression in the inferior and lateral leads  QPS Raw Data Images:  There is a breast shadow that accounts for the anterior attenuation. Stress Images:  mildly decreased anterolateral tracer uptake. Rest Images:  Comparison with the stress images reveals no significant change. Subtraction (SDS):  There is a fixed anterior defect that is most consistent with breast attenuation. Transient Ischemic Dilatation (Normal <1.22):  0.82 Lung/Heart Ratio (Normal <0.45):  0.29  Quantitative Gated Spect Images QGS EDV:  62 ml QGS ESV:  14 ml  Impression Exercise Capacity:  Good exercise capacity. BP Response:  Normal blood pressure response. Clinical Symptoms:  No significant symptoms noted. ECG Impression:  Significant ST abnormalities consistent with ischemia. Comparison with Prior Nuclear Study: No images to compare  Overall Impression:  Low risk stress nuclear study with mild breast attenuation artifact. ECG abnormalities could represent a "false positive" test or microvascular dysfunction.  LV Ejection Fraction: 78%.  LV Wall Motion:  NL LV Function; NL Wall Motion   Sanda Klein, MD, [facility] [phone] office [phone] pager

## 2014-08-07 ENCOUNTER — Encounter: Payer: Self-pay | Admitting: Cardiology

## 2014-08-28 ENCOUNTER — Encounter: Payer: Self-pay | Admitting: Gastroenterology

## 2014-10-23 ENCOUNTER — Other Ambulatory Visit: Payer: Self-pay

## 2014-10-23 DIAGNOSIS — Z1231 Encounter for screening mammogram for malignant neoplasm of breast: Secondary | ICD-10-CM

## 2014-11-08 ENCOUNTER — Ambulatory Visit: Payer: Medicare Other

## 2014-12-31 ENCOUNTER — Other Ambulatory Visit: Payer: Self-pay

## 2015-03-05 ENCOUNTER — Ambulatory Visit
Admission: RE | Admit: 2015-03-05 | Discharge: 2015-03-05 | Disposition: A | Discharge status: Home / Self Care | Payer: Medicare Other | Source: Ambulatory Visit | Attending: Family Medicine | Admitting: Family Medicine

## 2015-03-05 ENCOUNTER — Other Ambulatory Visit: Payer: Self-pay | Admitting: Family Medicine

## 2015-03-05 DIAGNOSIS — M7918 Myalgia, other site: Secondary | ICD-10-CM

## 2015-03-19 ENCOUNTER — Encounter: Payer: Self-pay | Admitting: Cardiology

## 2015-03-19 ENCOUNTER — Ambulatory Visit (INDEPENDENT_AMBULATORY_CARE_PROVIDER_SITE_OTHER): Payer: Medicare Other | Admitting: Cardiology

## 2015-03-19 VITALS — BP 119/62 | HR 67 | Ht 66.0 in | Wt 207.2 lb

## 2015-03-19 DIAGNOSIS — R001 Bradycardia, unspecified: Secondary | ICD-10-CM | POA: Diagnosis not present

## 2015-03-19 DIAGNOSIS — E669 Obesity, unspecified: Secondary | ICD-10-CM | POA: Insufficient documentation

## 2015-03-19 DIAGNOSIS — I10 Essential (primary) hypertension: Secondary | ICD-10-CM | POA: Diagnosis not present

## 2015-03-19 DIAGNOSIS — I2510 Atherosclerotic heart disease of native coronary artery without angina pectoris: Secondary | ICD-10-CM | POA: Diagnosis not present

## 2015-03-19 NOTE — Patient Instructions (Signed)
Medication Instructions:  The current medical regimen is effective;  continue present plan and medications.  Follow-Up: Follow up in 1 year with Dr. Skains.  You will receive a letter in the mail 2 months before you are due.  Please call us when you receive this letter to schedule your follow up appointment.  Thank you for choosing [facility]!!     

## 2015-03-19 NOTE — Progress Notes (Signed)
[facility]. [address], [address] [place], [place]  [address] Phone: [phone] Fax:  [phone]  Date:  03/19/2015   ID:  Krystal Mcpherson, Krystal Mcpherson Oct 07, 1940, MRN [id number]  PCP:  Milagros Evener, MD   History of Present Illness: Krystal Mcpherson is a 74 y.o. female with coronary artery disease status post percutaneous intervention to the right coronary artery in November of 2012 with overall normal ejection fraction, moderate hypokinesis of the inferior wall here for followup. For 2 months prior to stent had burning CP, GERD like, left arm.   Previously she felt jittery ago she takes her atenolol in the morning. In February of 2013, nuclear is low risk. Repeat nuclear stress test on 08/07/14 was also low risk, reassuring after she was having GERD-like chest discomfort. Her 24-hour Holter monitor showed paroxysmal atrial tachycardia. She has had musculoskeletal chest discomfort. Doing yard work, no symptoms.  [employer].    unfortunately had a fall, hurt her back. She is waiting to see Dr. Tonita Cong area she is also having some headaches, seeing neurologist.  Wt Readings from Last 3 Encounters:  03/19/15 207 lb 4 oz (94.008 kg)  08/06/14 206 lb (93.441 kg)  08/01/14 209 lb (94.802 kg)     Past Medical History  Diagnosis Date  . Vitamin B12 deficiency   . Migraine   . HTN (hypertension)   . HLD (hyperlipidemia)     No past surgical history on file.  Current Outpatient Prescriptions  Medication Sig Dispense Refill  . albuterol (PROVENTIL HFA;VENTOLIN HFA) 108 (90 BASE) MCG/ACT inhaler Inhale 2 puffs into the lungs every 4 (four) hours as needed for wheezing or shortness of breath.    Marland Kitchen aspirin 81 MG tablet Take 81 mg by mouth daily.    Marland Kitchen atenolol (TENORMIN) 25 MG tablet Take 12.5 mg by mouth daily.     . cetirizine (ZYRTEC) 10 MG tablet Take 10 mg by mouth daily.    . fluticasone (FLONASE) 50 MCG/ACT nasal spray Place 2 sprays into both nostrils as needed for allergies or rhinitis.      Marland Kitchen losartan-hydrochlorothiazide (HYZAAR) 100-12.5 MG per tablet Take 1 tablet by mouth daily.    . nitroGLYCERIN (NITROSTAT) 0.4 MG SL tablet Place 1 tablet (0.4 mg total) under the tongue every 5 (five) minutes as needed for chest pain. 25 tablet prn  . diphenhydrAMINE (BENADRYL) 25 mg capsule Take 25 mg by mouth every 6 (six) hours as needed.     No current facility-administered medications for this visit.    Allergies:    Allergies  Allergen Reactions  . Atenolol     Increased dosages cause bradycardia    . Atorvastatin     Muscle pain   . Augmentin [Amoxicillin-Pot Clavulanate]     GI   . Fentanyl     Itching all over   . Morphine And Related     Hallucinations   . Tetanus Toxoids     Localized knot     Social History:  The patient  reports that she has never smoked. She does not have any smokeless tobacco history on file.   No family history on file.Mother and Father both had MI  ROS:  Please see the history of present illness.  She is having hot flashes. Seeing physicians for this. Denies any fevers, chills, orthopnea, PND  All other systems reviewed and negative.   PHYSICAL EXAM: VS:  BP 119/62 mmHg  Pulse 67  Ht 5\' 6"  (  1.676 m)  Wt 207 lb 4 oz (94.008 kg)  BMI 33.47 kg/m2  SpO2 99% Well nourished, well developed, in no acute distress HEENT: normal, [place]/AT, EOMI Neck: no JVD, normal carotid upstroke, no bruit Cardiac:  normal S1, S2; RRR; no murmur Lungs:  clear to auscultation bilaterally, no wheezing, rhonchi or rales Abd: soft, nontender, no hepatomegaly, no bruitsobese Ext: no edema, 2+ distal pulses Skin: warm and dry GU: deferred Neuro: no focal abnormalities noted, AAO x 3  EKG:  08/01/14-sinus rhythm, 56, prior 03/02/14-    sinus bradycardia rate 50 with no other abnormalities   Nuclear stress: 08/07/14- low risk, no ischemia , reassuring.  LDL 109  ASSESSMENT AND PLAN:  1. Coronary artery currently describing symptoms, somewhat atypical,  difficult to discern between GERD, MSK, heart. She thinks it may feel somewhat like she felt prior to her RCA stent. Prior nuclear stress test in 2013 was low risk. We will go ahead and set her up for another stress test. She has nitroglycerin if necessary. No room to increase her atenolol because of bradycardia. If symptoms worsen or become more worrisome, emergency room. Secondary prevention. 2. History right coronary artery stent.  We discussed secondary prevention with statin use. She was able to tolerate pravastatin the past however she does not wish to take this medication because of reports of association with Alzheimer's. Tried to reassure her but she still does not wish to take medication. 3. Obesity - had fall backwards.  4. Bradycardia - she is continuing low-dose atenolol. No increase. 5. Hypertension-currently well controlled, medications reviewed 6. Hyperlipidemia-unfortunately she does not wish to retry statins at this time. LDL goal 70. 7. 6 month follow-up  Signed, Candee Furbish, MD [facility]  03/19/2015 9:23 AM

## 2015-03-25 ENCOUNTER — Other Ambulatory Visit: Payer: Self-pay | Admitting: Neurology

## 2015-03-25 DIAGNOSIS — R51 Headache: Principal | ICD-10-CM

## 2015-03-25 DIAGNOSIS — R519 Headache, unspecified: Secondary | ICD-10-CM

## 2015-03-25 DIAGNOSIS — S0990XA Unspecified injury of head, initial encounter: Secondary | ICD-10-CM

## 2015-03-27 ENCOUNTER — Ambulatory Visit
Admission: RE | Admit: 2015-03-27 | Discharge: 2015-03-27 | Disposition: A | Discharge status: Home / Self Care | Payer: Medicare Other | Source: Ambulatory Visit | Attending: Neurology | Admitting: Neurology

## 2015-03-27 DIAGNOSIS — R519 Headache, unspecified: Secondary | ICD-10-CM

## 2015-03-27 DIAGNOSIS — S0990XA Unspecified injury of head, initial encounter: Secondary | ICD-10-CM

## 2015-03-27 DIAGNOSIS — R51 Headache: Principal | ICD-10-CM

## 2015-09-13 ENCOUNTER — Encounter: Payer: Self-pay | Admitting: Gastroenterology

## 2015-10-30 ENCOUNTER — Ambulatory Visit (AMBULATORY_SURGERY_CENTER): Payer: Self-pay | Admitting: *Deleted

## 2015-10-30 VITALS — Ht 66.0 in | Wt 210.2 lb

## 2015-10-30 DIAGNOSIS — Z800 Family history of malignant neoplasm of digestive organs: Secondary | ICD-10-CM

## 2015-10-30 MED ORDER — SUPREP BOWEL PREP KIT 17.5-3.13-1.6 GM/177ML PO SOLN: 1.0000 | Freq: Once | ORAL | Status: DC

## 2015-10-30 NOTE — Progress Notes (Signed)
Patient denies any allergies to egg or soy products. Patient denies complications with anesthesia/sedation (fentanyl makes her itch).  Patient denies oxygen use at home and denies diet medications. Emmi instructions for colonoscopy explained but patient denied.  Pamphlet given.

## 2015-11-13 ENCOUNTER — Ambulatory Visit (AMBULATORY_SURGERY_CENTER): Payer: Medicare Other | Admitting: Gastroenterology

## 2015-11-13 ENCOUNTER — Encounter: Payer: Self-pay | Admitting: Gastroenterology

## 2015-11-13 VITALS — BP 171/60 | HR 52 | Temp 97.1°F | Resp 12 | Ht 66.0 in | Wt 210.0 lb

## 2015-11-13 DIAGNOSIS — Z800 Family history of malignant neoplasm of digestive organs: Secondary | ICD-10-CM

## 2015-11-13 DIAGNOSIS — D123 Benign neoplasm of transverse colon: Secondary | ICD-10-CM

## 2015-11-13 DIAGNOSIS — Z1211 Encounter for screening for malignant neoplasm of colon: Secondary | ICD-10-CM | POA: Diagnosis present

## 2015-11-13 DIAGNOSIS — R194 Change in bowel habit: Secondary | ICD-10-CM | POA: Insufficient documentation

## 2015-11-13 MED ORDER — SODIUM CHLORIDE 0.9 % IV SOLN: 500.0000 mL | INTRAVENOUS | Status: DC

## 2015-11-13 NOTE — Patient Instructions (Signed)
YOU HAD AN ENDOSCOPIC PROCEDURE TODAY AT [facility]:   Refer to the procedure report that was given to you for any specific questions about what was found during the examination.  If the procedure report does not answer your questions, please call your gastroenterologist to clarify.  If you requested that your care partner not be given the details of your procedure findings, then the procedure report has been included in a sealed envelope for you to review at your convenience later.  YOU SHOULD EXPECT: Some feelings of bloating in the abdomen. Passage of more gas than usual.  Walking can help get rid of the air that was put into your GI tract during the procedure and reduce the bloating. If you had a lower endoscopy (such as a colonoscopy or flexible sigmoidoscopy) you may notice spotting of blood in your stool or on the toilet paper. If you underwent a bowel prep for your procedure, you may not have a normal bowel movement for a few days.  Please Note:  You might notice some irritation and congestion in your nose or some drainage.  This is from the oxygen used during your procedure.  There is no need for concern and it should clear up in a day or so.  SYMPTOMS TO REPORT IMMEDIATELY:   Following lower endoscopy (colonoscopy or flexible sigmoidoscopy):  Excessive amounts of blood in the stool  Significant tenderness or worsening of abdominal pains  Swelling of the abdomen that is new, acute  Fever of 100F or higher  For urgent or emergent issues, a gastroenterologist can be reached at any hour by calling [phone].  DIET: Your first meal following the procedure should be a small meal and then it is ok to progress to your normal diet. Heavy or fried foods are harder to digest and may make you feel nauseous or bloated.  Likewise, meals heavy in dairy and vegetables can increase bloating.  Drink plenty of fluids but you should avoid alcoholic beverages for 24 hours.  ACTIVITY:   You should plan to take it easy for the rest of today and you should NOT DRIVE or use heavy machinery until tomorrow (because of the sedation medicines used during the test).    FOLLOW UP: Our staff will call the number listed on your records the next business day following your procedure to check on you and address any questions or concerns that you may have regarding the information given to you following your procedure. If we do not reach you, we will leave a message.  However, if you are feeling well and you are not experiencing any problems, there is no need to return our call.  We will assume that you have returned to your regular daily activities without incident.  If any biopsies were taken you will be contacted by phone or by letter within the next 1-3 weeks.  Please call us at [phone] if you have not heard about the biopsies in 3 weeks.    SIGNATURES/CONFIDENTIALITY: You and/or your care partner have signed paperwork which will be entered into your electronic medical record.  These signatures attest to the fact that that the information above on your After Visit Summary has been reviewed and is understood.  Full responsibility of the confidentiality of this discharge information lies with you and/or your care-partner.  Please read over handouts about polyps, diverticulosis, hemorrhoids and high fiber diets  Please continue your normal medications

## 2015-11-13 NOTE — Progress Notes (Signed)
Called to room to assist during endoscopic procedure.  Patient ID and intended procedure confirmed with present staff. Received instructions for my participation in the procedure from the performing physician.  

## 2015-11-13 NOTE — Op Note (Signed)
[facility] Patient Name: Krystal Mcpherson Procedure Date: 11/13/2015 1:27 PM MRN: [id number] Endoscopist: Ladene Artist , MD Age: 75 Date of Birth: 29-Apr-1941 Gender: Female Procedure:                Colonoscopy Indications:              Colon cancer screening in patient at increased                            risk: Family history of colorectal cancer in                            multiple 2nd degree relatives Medicines:                Monitored Anesthesia Care Procedure:                Pre-Anesthesia Assessment:                           - Prior to the procedure, a History and Physical                            was performed, and patient medications and                            allergies were reviewed. The patient's tolerance of                            previous anesthesia was also reviewed. The risks                            and benefits of the procedure and the sedation                            options and risks were discussed with the patient.                            All questions were answered, and informed consent                            was obtained. Prior Anticoagulants: The patient has                            taken no previous anticoagulant or antiplatelet                            agents. ASA Grade Assessment: II - A patient with                            mild systemic disease. After reviewing the risks                            and benefits, the patient was deemed in  satisfactory condition to undergo the procedure.                           After obtaining informed consent, the colonoscope                            was passed under direct vision. Throughout the                            procedure, the patient's blood pressure, pulse, and                            oxygen saturations were monitored continuously. The                            Model PCF-H190L [id number]) scope was introduced   through the anus and advanced to the the cecum,                            identified by appendiceal orifice and ileocecal                            valve. The colonoscopy was performed without                            difficulty. The patient tolerated the procedure                            well. The quality of the bowel preparation was                            excellent. The ileocecal valve, appendiceal                            orifice, and rectum were photographed. Scope In: 1:41:40 PM Scope Out: 1:56:44 PM Scope Withdrawal Time: 0 hours 10 minutes 37 seconds  Total Procedure Duration: 0 hours 15 minutes 4 seconds  Findings:                 The digital rectal exam was normal.                           A 6 mm polyp was found in the transverse colon. The                            polyp was sessile. The polyp was removed with a                            cold snare. Resection and retrieval were complete.                           Many small-mouthed diverticula were found in the                            sigmoid  colon. There was narrowing of the colon in                            association with the diverticular opening. There                            was evidence of diverticular spasm. There was no                            evidence of diverticular bleeding.                           Internal hemorrhoids were found during                            retroflexion. The hemorrhoids were small and Grade                            I (internal hemorrhoids that do not prolapse).                           The exam was otherwise normal throughout the                            examined colon. Complications:            No immediate complications. Estimated Blood Loss:     Estimated blood loss: none. Impression:               - One 6 mm polyp in the transverse colon, removed                            with a cold snare. Resected and retrieved.                           - Moderate  diverticulosis in the sigmoid colon.                           - Internal hemorrhoids. Recommendation:           - Patient has a contact number available for                            emergencies. The signs and symptoms of potential                            delayed complications were discussed with the                            patient. Return to normal activities tomorrow.                            Written discharge instructions were provided to the                            patient.                           -  High fiber diet.                           - Continue present medications.                           - Await pathology results.                           - Repeat colonoscopy in 5 years for surveillance. Ladene Artist, MD 11/13/2015 2:01:38 PM This report has been signed electronically.

## 2015-11-13 NOTE — Progress Notes (Signed)
A/ox3 pleased with MAC, report to Kristen RN 

## 2015-11-14 ENCOUNTER — Telehealth: Payer: Self-pay

## 2015-11-14 NOTE — Telephone Encounter (Signed)
  Follow up Call-  Call back number 11/13/2015  Post procedure Call Back phone  # [phone]  Permission to leave phone message Yes     Patient questions:  Do you have a fever, pain , or abdominal swelling? No. Pain Score  0 *  Have you tolerated food without any problems? Yes.    Have you been able to return to your normal activities? Yes.    Do you have any questions about your discharge instructions: Diet   No. Medications  No. Follow up visit  No.  Do you have questions or concerns about your Care? No.  Actions: * If pain score is 4 or above: No action needed, pain <4.

## 2015-11-22 ENCOUNTER — Encounter: Payer: Self-pay | Admitting: Gastroenterology

## 2016-08-09 ENCOUNTER — Observation Stay (HOSPITAL_COMMUNITY)
Admission: EM | Admit: 2016-08-09 | Discharge: 2016-08-10 | Disposition: A | Discharge status: Home / Self Care | Payer: Medicare Other | Attending: Internal Medicine | Admitting: Internal Medicine

## 2016-08-09 ENCOUNTER — Encounter (HOSPITAL_COMMUNITY): Payer: Self-pay | Admitting: Emergency Medicine

## 2016-08-09 ENCOUNTER — Emergency Department (HOSPITAL_COMMUNITY): Payer: Medicare Other

## 2016-08-09 DIAGNOSIS — W540XXA Bitten by dog, initial encounter: Secondary | ICD-10-CM

## 2016-08-09 DIAGNOSIS — S61459D Open bite of unspecified hand, subsequent encounter: Secondary | ICD-10-CM

## 2016-08-09 DIAGNOSIS — Z79899 Other long term (current) drug therapy: Secondary | ICD-10-CM | POA: Diagnosis not present

## 2016-08-09 DIAGNOSIS — S61452D Open bite of left hand, subsequent encounter: Secondary | ICD-10-CM | POA: Diagnosis not present

## 2016-08-09 DIAGNOSIS — W540XXD Bitten by dog, subsequent encounter: Secondary | ICD-10-CM | POA: Insufficient documentation

## 2016-08-09 DIAGNOSIS — S61451D Open bite of right hand, subsequent encounter: Secondary | ICD-10-CM | POA: Diagnosis not present

## 2016-08-09 DIAGNOSIS — Z7982 Long term (current) use of aspirin: Secondary | ICD-10-CM | POA: Diagnosis not present

## 2016-08-09 DIAGNOSIS — I10 Essential (primary) hypertension: Secondary | ICD-10-CM | POA: Diagnosis not present

## 2016-08-09 DIAGNOSIS — S61451A Open bite of right hand, initial encounter: Secondary | ICD-10-CM

## 2016-08-09 DIAGNOSIS — S61452A Open bite of left hand, initial encounter: Secondary | ICD-10-CM

## 2016-08-09 DIAGNOSIS — L03119 Cellulitis of unspecified part of limb: Secondary | ICD-10-CM

## 2016-08-09 DIAGNOSIS — L0390 Cellulitis, unspecified: Secondary | ICD-10-CM | POA: Diagnosis present

## 2016-08-09 HISTORY — DX: Other complications of anesthesia, initial encounter: T88.59XA

## 2016-08-09 HISTORY — DX: Other specified postprocedural states: Z98.890

## 2016-08-09 HISTORY — DX: Adverse effect of unspecified anesthetic, initial encounter: T41.45XA

## 2016-08-09 HISTORY — DX: Nausea with vomiting, unspecified: R11.2

## 2016-08-09 LAB — CBC WITH DIFFERENTIAL/PLATELET
Basophils Absolute: 0 10*3/uL (ref 0.0–0.1)
Basophils Relative: 1 %
Eosinophils Absolute: 0.2 10*3/uL (ref 0.0–0.7)
Eosinophils Relative: 2 %
HCT: 42.3 % (ref 36.0–46.0)
Hemoglobin: 13.7 g/dL (ref 12.0–15.0)
Lymphocytes Relative: 37 %
Lymphs Abs: 2.5 10*3/uL (ref 0.7–4.0)
MCH: 27.6 pg (ref 26.0–34.0)
MCHC: 32.4 g/dL (ref 30.0–36.0)
MCV: 85.3 fL (ref 78.0–100.0)
Monocytes Absolute: 0.6 10*3/uL (ref 0.1–1.0)
Monocytes Relative: 8 %
Neutro Abs: 3.4 10*3/uL (ref 1.7–7.7)
Neutrophils Relative %: 52 %
Platelets: 237 10*3/uL (ref 150–400)
RBC: 4.96 MIL/uL (ref 3.87–5.11)
RDW: 13.6 % (ref 11.5–15.5)
WBC: 6.6 10*3/uL (ref 4.0–10.5)

## 2016-08-09 LAB — COMPREHENSIVE METABOLIC PANEL
ALT: 17 U/L (ref 14–54)
AST: 19 U/L (ref 15–41)
Albumin: 3.7 g/dL (ref 3.5–5.0)
Alkaline Phosphatase: 45 U/L (ref 38–126)
Anion gap: 14 (ref 5–15)
BUN: 15 mg/dL (ref 6–20)
CO2: 25 mmol/L (ref 22–32)
Calcium: 9.2 mg/dL (ref 8.9–10.3)
Chloride: 100 mmol/L — ABNORMAL LOW (ref 101–111)
Creatinine, Ser: 0.81 mg/dL (ref 0.44–1.00)
GFR calc Af Amer: >60 mL/min (ref >60)
GFR calc non Af Amer: >60 mL/min (ref >60)
Glucose, Bld: 93 mg/dL (ref 65–99)
Potassium: 3.9 mmol/L (ref 3.5–5.1)
Sodium: 139 mmol/L (ref 135–145)
Total Bilirubin: 0.6 mg/dL (ref 0.3–1.2)
Total Protein: 6.8 g/dL (ref 6.5–8.1)

## 2016-08-09 LAB — CK: Total CK: 77 U/L (ref 38–234)

## 2016-08-09 LAB — SEDIMENTATION RATE: Sed Rate: 27 mm/hr — ABNORMAL HIGH (ref 0–22)

## 2016-08-09 LAB — C-REACTIVE PROTEIN: CRP: 4.3 mg/dL — ABNORMAL HIGH (ref <1.0)

## 2016-08-09 MED ORDER — LORATADINE 10 MG PO TABS
10.0000 mg | ORAL_TABLET | Freq: Every day | ORAL | Status: DC
  Administered 2016-08-10: 10 mg via ORAL
  Filled 2016-08-09: qty 1

## 2016-08-09 MED ORDER — FLUTICASONE PROPIONATE 50 MCG/ACT NA SUSP: 2.0000 | Freq: Every day | NASAL | Status: DC | PRN

## 2016-08-09 MED ORDER — POLYETHYLENE GLYCOL 3350 17 G PO PACK: 17.0000 g | PACK | Freq: Every day | ORAL | Status: DC | PRN

## 2016-08-09 MED ORDER — VANCOMYCIN HCL 10 G IV SOLR
1750.0000 mg | Freq: Once | INTRAVENOUS | Status: AC
  Administered 2016-08-09: 1750 mg via INTRAVENOUS
  Filled 2016-08-09: qty 1750

## 2016-08-09 MED ORDER — ACETAMINOPHEN 650 MG RE SUPP: 650.0000 mg | Freq: Four times a day (QID) | RECTAL | Status: DC | PRN

## 2016-08-09 MED ORDER — KETOROLAC TROMETHAMINE 15 MG/ML IJ SOLN
15.0000 mg | Freq: Once | INTRAMUSCULAR | Status: AC
  Administered 2016-08-09: 15 mg via INTRAVENOUS
  Filled 2016-08-09: qty 1

## 2016-08-09 MED ORDER — ONDANSETRON HCL 4 MG/2ML IJ SOLN: 4.0000 mg | Freq: Four times a day (QID) | INTRAMUSCULAR | Status: DC | PRN

## 2016-08-09 MED ORDER — HYDROCODONE-ACETAMINOPHEN 5-325 MG PO TABS: 1.0000 | ORAL_TABLET | Freq: Four times a day (QID) | ORAL | Status: DC | PRN

## 2016-08-09 MED ORDER — FAMOTIDINE 20 MG PO TABS
10.0000 mg | ORAL_TABLET | Freq: Every day | ORAL | Status: DC
  Administered 2016-08-10: 10 mg via ORAL
  Filled 2016-08-09 (×2): qty 1

## 2016-08-09 MED ORDER — ATENOLOL 25 MG PO TABS
12.5000 mg | ORAL_TABLET | Freq: Every day | ORAL | Status: DC
  Administered 2016-08-10: 12.5 mg via ORAL
  Filled 2016-08-09: qty 1

## 2016-08-09 MED ORDER — METHOCARBAMOL 500 MG PO TABS
500.0000 mg | ORAL_TABLET | Freq: Three times a day (TID) | ORAL | Status: DC
  Administered 2016-08-09 – 2016-08-10 (×2): 500 mg via ORAL
  Filled 2016-08-09 (×2): qty 1

## 2016-08-09 MED ORDER — ONDANSETRON HCL 4 MG PO TABS: 4.0000 mg | ORAL_TABLET | Freq: Four times a day (QID) | ORAL | Status: DC | PRN

## 2016-08-09 MED ORDER — ALBUTEROL SULFATE (2.5 MG/3ML) 0.083% IN NEBU: 2.5000 mg | INHALATION_SOLUTION | RESPIRATORY_TRACT | Status: DC | PRN

## 2016-08-09 MED ORDER — NITROGLYCERIN 0.4 MG SL SUBL: 0.4000 mg | SUBLINGUAL_TABLET | SUBLINGUAL | Status: DC | PRN

## 2016-08-09 MED ORDER — LOSARTAN POTASSIUM 50 MG PO TABS
100.0000 mg | ORAL_TABLET | Freq: Every day | ORAL | Status: DC
  Administered 2016-08-10: 100 mg via ORAL
  Filled 2016-08-09 (×2): qty 2

## 2016-08-09 MED ORDER — SODIUM CHLORIDE 0.9 % IV SOLN
3.0000 g | Freq: Once | INTRAVENOUS | Status: AC
  Administered 2016-08-09: 3 g via INTRAVENOUS
  Filled 2016-08-09: qty 3

## 2016-08-09 MED ORDER — SENNA 8.6 MG PO TABS
1.0000 | ORAL_TABLET | Freq: Two times a day (BID) | ORAL | Status: DC
  Administered 2016-08-09: 8.6 mg via ORAL
  Filled 2016-08-09 (×2): qty 1

## 2016-08-09 MED ORDER — ASPIRIN EC 81 MG PO TBEC
81.0000 mg | DELAYED_RELEASE_TABLET | Freq: Every day | ORAL | Status: DC
  Administered 2016-08-10: 81 mg via ORAL
  Filled 2016-08-09: qty 1

## 2016-08-09 MED ORDER — VANCOMYCIN HCL IN DEXTROSE 1-5 GM/200ML-% IV SOLN
1000.0000 mg | Freq: Two times a day (BID) | INTRAVENOUS | Status: DC
  Administered 2016-08-10: 1000 mg via INTRAVENOUS
  Filled 2016-08-09 (×2): qty 200

## 2016-08-09 MED ORDER — HYDROCODONE-ACETAMINOPHEN 5-325 MG PO TABS
1.0000 | ORAL_TABLET | Freq: Once | ORAL | Status: AC
  Administered 2016-08-09: 1 via ORAL
  Filled 2016-08-09: qty 1

## 2016-08-09 MED ORDER — ACETAMINOPHEN 325 MG PO TABS: 650.0000 mg | ORAL_TABLET | Freq: Four times a day (QID) | ORAL | Status: DC | PRN

## 2016-08-09 MED ORDER — ENOXAPARIN SODIUM 40 MG/0.4ML ~~LOC~~ SOLN
40.0000 mg | SUBCUTANEOUS | Status: DC
  Filled 2016-08-09: qty 0.4

## 2016-08-09 MED ORDER — KETOROLAC TROMETHAMINE 15 MG/ML IJ SOLN
15.0000 mg | Freq: Four times a day (QID) | INTRAMUSCULAR | Status: DC
  Administered 2016-08-10 (×2): 15 mg via INTRAVENOUS
  Filled 2016-08-09 (×2): qty 1

## 2016-08-09 NOTE — Progress Notes (Addendum)
Pharmacy Antibiotic Note  Krystal Mcpherson is a 76 y.o. female admitted on 08/09/2016 with hand cellulitis secondary to dog bite.  Patient failed outpatient cephalexin. Pharmacy has been consulted for vancomycin dosing. No recent labs to evaluate renal function.  Plan: Vancomycin 1750 mg IV load Goal vancomycin trough 10-15 mcg/ml Follow-up renal function to schedule a maintenance dose Monitor renal function, clinical progress, culture data, trough as clinically indicated  Height: 5' 5.5" (166.4 cm) Weight: 200 lb (90.7 kg) IBW/kg (Calculated) : 58.15  Temp (24hrs), Avg:98.7 F (37.1 C), Min:98.7 F (37.1 C), Max:98.7 F (37.1 C)  No results for input(s): WBC, CREATININE, LATICACIDVEN, VANCOTROUGH, VANCOPEAK, VANCORANDOM, GENTTROUGH, GENTPEAK, GENTRANDOM, TOBRATROUGH, TOBRAPEAK, TOBRARND, AMIKACINPEAK, AMIKACINTROU, AMIKACIN in the last 168 hours.  CrCl cannot be calculated (Patient's most recent lab result is older than the maximum 21 days allowed.).    Allergies  Allergen Reactions  . Atenolol     Increased dosages cause bradycardia.  Patient can take atenolol 25 mg with no problems.    . Atorvastatin     Muscle pain   . Augmentin [Amoxicillin-Pot Clavulanate]     GI   . Fentanyl     Itching all over   . Morphine And Related     Hallucinations   . Tetanus Toxoids     Localized knot     Antimicrobials this admission: Vancomycin 2/4 >>  Unasyn 2/4 >>   Dose adjustments this admission:   Microbiology results: 2/4 BCx: pending   Thank you for allowing pharmacy to be a part of this patient's care.  Renold Genta, PharmD, [place] - [phone] 08/09/2016 4:24 PM   Addendum: SCr 0.81, est CrCl ~69.5 ml/min  Vancomycin 1000 mg IV q12h  Renold Genta, PharmD, BCPS Clinical Pharmacist 08/09/2016 5:26 PM

## 2016-08-09 NOTE — H&P (Signed)
[facility] History and Physical   Patient: Krystal Mcpherson TIR:[id number]   PCP: Judge Stall, FNP DOB: 1940/12/22   DOA: 08/09/2016   DOS: 08/09/2016   DOS: the patient was seen and examined on 08/09/2016  Patient coming from: The patient is coming from home.  Chief Complaint: Bilateral hand pain  HPI: CLETA HEATLEY is a 76 y.o. female with Past medical history of hypertension, GERD, CAD. Patient presented with complaints of worsening of wound that she got from dog bite. 08/06/2015 patient was bitten by her dog, on both hands.. She went to see her PCP the next day and given a shot of IV Rocephin followed by a course of oral Keflex. She had significant swelling of her upper extremities at that time which is gradually improving. She noticed that the redness around the wound is not improving and pain is also increasing. In the left arm. She noticed some streaking from her right arm and then decided to come to the hospital. Pain in both extremities and described as throbbing and aching sensation. Patient does not have any significant limitation with her wrist joint bilaterally. Denies any nausea vomiting fevers chills chest pain and abdominal pain and a rash anywhere else. She mentions that her PCP told her that she is up-to-date on tetanus shot and does not need one, her dog is vaccinated and she does not need any rabies vaccination as well and her dog os doing fine per patient.  ED Course: Receiving IV Unasyn and vancomycin, due to worsening of the wounds despite taking antibiotic as an outpatient patient was referred for admission.  At her baseline ambulates without any support And is independent for most of her ADL; manages her medication on her own.  Review of Systems: as mentioned in the history of present illness.  All other systems reviewed and are negative.  Past Medical History:  Diagnosis Date  . Allergy   . Anemia    Hx  . Arthritis    neck, lower back  . Change in bowel  habits   . GERD (gastroesophageal reflux disease)   . HLD (hyperlipidemia)    hx - no med  . HTN (hypertension)   . Migraine   . Stented coronary artery    right, no problems  . SVD (spontaneous vaginal delivery)    x 2  . Vitamin B12 deficiency    Past Surgical History:  Procedure Laterality Date  . bladder tact    . 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 CHOLECYSTECTOMY    . COLONOSCOPY  2009    [facility]  . cornary stent Right 05/2011  . NASAL SINUS SURGERY     x 2  . TOTAL ABDOMINAL HYSTERECTOMY W/ BILATERAL SALPINGOOPHORECTOMY     Social History:  reports that she has never smoked. She has never used smokeless tobacco. She reports that she does not drink alcohol or use drugs.  Allergies  Allergen Reactions  . Atenolol     Increased dosages cause bradycardia.  Patient can take atenolol 25 mg with no problems.    . Atorvastatin     Muscle pain   . Ciprofloxacin Hives  . Fentanyl     Itching all over   . Morphine And Related     Hallucinations   . Tetanus Toxoids     Localized knot   . Augmentin [Amoxicillin-Pot Clavulanate] Nausea And Vomiting    Pt has taken Keflex (cephalexin) without adverse reaction. Started 08/06/16  Family History  Problem Relation Age of Onset  . Colon cancer Paternal Grandmother   . Esophageal cancer Neg Hx   . Rectal cancer Neg Hx   . Stomach cancer Neg Hx      Prior to Admission medications   Medication Sig Start Date End Date Taking? Authorizing Provider  acetaminophen (TYLENOL) 500 MG tablet Take 1,000 mg by mouth every 6 (six) hours as needed (pain).   Yes Historical Provider, MD  albuterol (PROVENTIL HFA;VENTOLIN HFA) 108 (90 BASE) MCG/ACT inhaler Inhale 2 puffs into the lungs every 4 (four) hours as needed for wheezing or shortness of breath. Reported on 10/30/2015   Yes Historical Provider, MD  aspirin EC 81 MG tablet Take 81 mg by mouth daily.   Yes Historical Provider, MD  atenolol (TENORMIN) 25 MG  tablet Take 12.5 mg by mouth daily.    Yes Historical Provider, MD  cephALEXin (KEFLEX) 500 MG capsule Take 500 mg by mouth 2 (two) times daily. 10 day course started 08/06/16   Yes Historical Provider, MD  cetirizine (ZYRTEC) 10 MG tablet Take 10 mg by mouth daily.   Yes Historical Provider, MD  diphenhydrAMINE (BENADRYL) 25 mg capsule Take 25-50 mg by mouth every 6 (six) hours as needed for itching. Reported on 10/30/2015   Yes Historical Provider, MD  fluticasone (FLONASE) 50 MCG/ACT nasal spray Place 2 sprays into both nostrils as needed for allergies or rhinitis.   Yes Historical Provider, MD  ibuprofen (ADVIL,MOTRIN) 200 MG tablet Take 400-600 mg by mouth every 6 (six) hours as needed for headache (pain).   Yes Historical Provider, MD  losartan-hydrochlorothiazide (HYZAAR) 100-12.5 MG per tablet Take 1 tablet by mouth daily.   Yes Historical Provider, MD  neomycin-bacitracin-polymyxin (NEOSPORIN) OINT Apply 1 application topically 3 (three) times daily as needed for wound care.   Yes Historical Provider, MD  nitroGLYCERIN (NITROSTAT) 0.4 MG SL tablet Place 1 tablet (0.4 mg total) under the tongue every 5 (five) minutes as needed for chest pain. 03/02/14  Yes Jerline Pain, MD  RaNITidine HCl (ZANTAC PO) Take 1 tablet by mouth daily as needed (acid reflux).   Yes Historical Provider, MD  traMADol (ULTRAM) 50 MG tablet Take 50 mg by mouth every 6 (six) hours as needed (pain). Reported on 11/13/2015 03/31/12  Yes Historical Provider, MD    Physical Exam: Vitals:   08/09/16 1600 08/09/16 1615 08/09/16 1730 08/09/16 1745  BP:  158/97 143/57 161/78  Pulse:  63 (!) 54 (!) 57  Resp:    19  Temp:      TempSrc:      SpO2:  99% 100% 100%  Weight: 90.7 kg (200 lb)     Height: 5' 5.5" (1.664 m)       General: Alert, Awake and Oriented to Time, Place and Person. Appear in mild distress, affect appropriate Eyes: PERRL, Conjunctiva normal ENT: Oral Mucosa clear moist. Neck: no JVD, no Abnormal Mass Or  lumps Cardiovascular: S1 and S2 Present, no Murmur, Peripheral Pulses Present Respiratory: Bilateral Air entry equal and Decreased, no use of accessory muscle, Clear to Auscultation, no Crackles, no wheezes Abdomen: Bowel Sound present, Soft and no tenderness Skin: 2 spots of ulceration on both sides on dorsum of the hand with redness and warmth, no Rash, no induration Extremities: no Pedal edema, no calf tenderness No decrease in ROM at the wrist joint bilaterally Neurologic: Grossly no focal neuro deficit. Bilaterally Equal motor strength  Labs on Admission:  CBC:  Recent  Labs Lab 08/09/16 1624  WBC 6.6  NEUTROABS 3.4  HGB 13.7  HCT 42.3  MCV 85.3  PLT 482   Basic Metabolic Panel:  Recent Labs Lab 08/09/16 1624  NA 139  K 3.9  CL 100*  CO2 25  GLUCOSE 93  BUN 15  CREATININE 0.81  CALCIUM 9.2   GFR: Estimated Creatinine Clearance: 69.5 mL/min (by C-G formula based on SCr of 0.81 mg/dL). Liver Function Tests:  Recent Labs Lab 08/09/16 1624  AST 19  ALT 17  ALKPHOS 45  BILITOT 0.6  PROT 6.8  ALBUMIN 3.7   No results for input(s): LIPASE, AMYLASE in the last 168 hours. No results for input(s): AMMONIA in the last 168 hours. Coagulation Profile: No results for input(s): INR, PROTIME in the last 168 hours. Cardiac Enzymes: No results for input(s): CKTOTAL, CKMB, CKMBINDEX, TROPONINI in the last 168 hours. BNP (last 3 results) No results for input(s): PROBNP in the last 8760 hours. HbA1C: No results for input(s): HGBA1C in the last 72 hours. CBG: No results for input(s): GLUCAP in the last 168 hours. Lipid Profile: No results for input(s): CHOL, HDL, LDLCALC, TRIG, CHOLHDL, LDLDIRECT in the last 72 hours. Thyroid Function Tests: No results for input(s): TSH, T4TOTAL, FREET4, T3FREE, THYROIDAB in the last 72 hours. Anemia Panel: No results for input(s): VITAMINB12, FOLATE, FERRITIN, TIBC, IRON, RETICCTPCT in the last 72 hours. Urine analysis: No results  found for: COLORURINE, APPEARANCEUR, LABSPEC, [place], GLUCOSEU, HGBUR, BILIRUBINUR, KETONESUR, PROTEINUR, UROBILINOGEN, NITRITE, LEUKOCYTESUR  Radiological Exams on Admission: Dg Hand Complete Left  Result Date: 08/09/2016 CLINICAL DATA:  Dog bite several days ago with persistent pain and swelling, initial encounter EXAM: LEFT HAND - COMPLETE 3+ VIEW COMPARISON:  None. FINDINGS: Mild soft tissue swelling is noted about the metacarpals consistent with the recent injury. No acute fracture or dislocation is seen. No radiopaque foreign body is noted. IMPRESSION: Soft tissue swelling without acute bony abnormality. Electronically Signed   By: Inez Catalina M.D.   On: 08/09/2016 16:50   Assessment/Plan 1.Principal Problem:   Dog bite, hand, unspecified laterality, subsequent encounter   Cellulitis of bilateral upper extremities. Patient was given Rocephin and Keflex with which she does not have any improvement in her wounds. Currently she does not appear to have any significant abscess or compartment syndrome of the upper extremities. I would continue Unasyn and vancomycin, patient would ideally be treated at the time of discharge with oral Augmentin plus or minus doxycycline. CRP elevated, ESR pending check CK. Pictures are already taken for the wound, if there is improvement in the wound tomorrow patient would be able to go home on above mentioned antibiotics for a total of 7 days.  Active Problems:   Essential hypertension, benign Continuing home medication, holding HCTZ    History of CAD. Continue aspirin.    History of GERD. Continuing Pepcid.  Nutrition: Cardiac diet DVT Prophylaxis: subcutaneous Heparin  Advance goals of care discussion: Full code   Consults: None  Family Communication: family was present at bedside, at the time of interview.  Opportunity was given to ask question and all questions were answered satisfactorily.  Disposition: Admitted as observation, med-surge  unit. Likely to be discharged home, in 1 day.  Author: Berle Mull, MD [facility] Pager: [phone] 08/09/2016  If 7PM-7AM, please contact night-coverage [email or web address] Password [email or web address]

## 2016-08-09 NOTE — ED Triage Notes (Signed)
Pt. Stated, I was bitten by my dog on Wed. On my rt. Hand. Given antibiotic but the hand area is red swollen and pt. Is concerned with cellulitis. Pain is really bad.

## 2016-08-09 NOTE — ED Provider Notes (Signed)
[facility] DEPT Provider Note   CSN: [id number] Arrival date & time: 08/09/16  1358     History   Chief Complaint Chief Complaint  Patient presents with  . Animal Bite  . Hand Injury    HPI Krystal Mcpherson is a 76 y.o. female.  HPI 76 yo F with PMHx as below here with hand pain and drainage. 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 She went to her PCP on 2/1 and was given Rocephin IM and placed on keflex. Since then, she has had increasing pain, drainage, and redness along multiple bite marks on her hands. Over the last 24 hours, she has developed worsening redness on her left hand as well as pain increasing in her right hand and spreading up her forearm. No fever, nausea, vomiting. No other medical complaints. No numbness or weakness. The pain is a throbbing, aching sensation. She did have XR of her right hand which was normal at her PCP.   Past Medical History:  Diagnosis Date  . Allergy   . Anemia    Hx  . Arthritis    neck, lower back  . Change in bowel habits   . GERD (gastroesophageal reflux disease)   . HLD (hyperlipidemia)    hx - no med  . HTN (hypertension)   . Migraine   . Stented coronary artery    right, no problems  . SVD (spontaneous vaginal delivery)    x 2  . Vitamin B12 deficiency     Patient Active Problem List   Diagnosis Date Noted  . Cellulitis 08/09/2016  . Change in bowel habits   . Obesity 03/19/2015  . Angina decubitus ([place]) 08/01/2014  . Atherosclerosis of native coronary artery of native heart without angina pectoris 03/02/2014  . Essential hypertension, benign 03/02/2014  . Bradycardia 03/02/2014    Past Surgical History:  Procedure Laterality Date  . bladder tact    . 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 CHOLECYSTECTOMY    . COLONOSCOPY  2009    [facility]  . cornary stent Right 05/2011  . NASAL SINUS SURGERY     x 2  . TOTAL ABDOMINAL HYSTERECTOMY W/ BILATERAL SALPINGOOPHORECTOMY       OB History    No data available       Home Medications    Prior to Admission medications   Medication Sig Start Date End Date Taking? Authorizing Provider  acetaminophen (TYLENOL) 500 MG tablet Take 1,000 mg by mouth every 6 (six) hours as needed (pain).   Yes Historical Provider, MD  albuterol (PROVENTIL HFA;VENTOLIN HFA) 108 (90 BASE) MCG/ACT inhaler Inhale 2 puffs into the lungs every 4 (four) hours as needed for wheezing or shortness of breath. Reported on 10/30/2015   Yes Historical Provider, MD  aspirin EC 81 MG tablet Take 81 mg by mouth daily.   Yes Historical Provider, MD  atenolol (TENORMIN) 25 MG tablet Take 12.5 mg by mouth daily.    Yes Historical Provider, MD  cephALEXin (KEFLEX) 500 MG capsule Take 500 mg by mouth 2 (two) times daily. 10 day course started 08/06/16   Yes Historical Provider, MD  cetirizine (ZYRTEC) 10 MG tablet Take 10 mg by mouth daily.   Yes Historical Provider, MD  diphenhydrAMINE (BENADRYL) 25 mg capsule Take 25-50 mg by mouth every 6 (six) hours as needed for itching. Reported on 10/30/2015   Yes Historical Provider, MD  fluticasone (FLONASE) 50 MCG/ACT nasal  spray Place 2 sprays into both nostrils as needed for allergies or rhinitis.   Yes Historical Provider, MD  ibuprofen (ADVIL,MOTRIN) 200 MG tablet Take 400-600 mg by mouth every 6 (six) hours as needed for headache (pain).   Yes Historical Provider, MD  losartan-hydrochlorothiazide (HYZAAR) 100-12.5 MG per tablet Take 1 tablet by mouth daily.   Yes Historical Provider, MD  neomycin-bacitracin-polymyxin (NEOSPORIN) OINT Apply 1 application topically 3 (three) times daily as needed for wound care.   Yes Historical Provider, MD  nitroGLYCERIN (NITROSTAT) 0.4 MG SL tablet Place 1 tablet (0.4 mg total) under the tongue every 5 (five) minutes as needed for chest pain. 03/02/14  Yes Jerline Pain, MD  RaNITidine HCl (ZANTAC PO) Take 1 tablet by mouth daily as needed (acid reflux).   Yes Historical Provider,  MD  traMADol (ULTRAM) 50 MG tablet Take 50 mg by mouth every 6 (six) hours as needed (pain). Reported on 11/13/2015 03/31/12  Yes Historical Provider, MD    Family History Family History  Problem Relation Age of Onset  . Colon cancer Paternal Grandmother   . Esophageal cancer Neg Hx   . Rectal cancer Neg Hx   . Stomach cancer Neg Hx     Social History Social History  Substance Use Topics  . Smoking status: Never Smoker  . Smokeless tobacco: Never Used  . Alcohol use No     Allergies   Atenolol; Atorvastatin; Augmentin [amoxicillin-pot clavulanate]; Ciprofloxacin; Fentanyl; Morphine and related; and Tetanus toxoids   Review of Systems Review of Systems  Constitutional: Negative for chills, fatigue and fever.  HENT: Negative for congestion and rhinorrhea.   Eyes: Negative for visual disturbance.  Respiratory: Negative for cough, shortness of breath and wheezing.   Cardiovascular: Negative for chest pain and leg swelling.  Gastrointestinal: Negative for abdominal pain, diarrhea, nausea and vomiting.  Genitourinary: Negative for dysuria and flank pain.  Musculoskeletal: Negative for neck pain and neck stiffness.  Skin: Positive for rash and wound.  Allergic/Immunologic: Negative for immunocompromised state.  Neurological: Negative for syncope, weakness and headaches.  All other systems reviewed and are negative.    Physical Exam Updated Vital Signs BP 161/78   Pulse (!) 57   Temp 98.7 F (37.1 C) (Oral)   Resp 19   Ht 5' 5.5" (1.664 m)   Wt 200 lb (90.7 kg)   SpO2 100%   BMI 32.78 kg/m   Physical Exam  Constitutional: She is oriented to person, place, and time. She appears well-developed and well-nourished. No distress.  HENT:  Head: Normocephalic and atraumatic.  Eyes: Conjunctivae are normal.  Neck: Neck supple.  Cardiovascular: Normal rate, regular rhythm and normal heart sounds.   Pulmonary/Chest: Effort normal. No respiratory distress. She has no wheezes.   Abdominal: Soft. She exhibits no distension.  Musculoskeletal: She exhibits no edema.  Neurological: She is alert and oriented to person, place, and time. She exhibits normal muscle tone.  Skin: Skin is warm. Capillary refill takes less than 2 seconds. No rash noted.  Nursing note and vitals reviewed.   UPPER EXTREMITY EXAM: BILATERAL  INSPECTION & PALPATION: Multiple areas of erythematous induration b/l hands, with right hand erythema extending up forearm with associated TTP. No fluctuance or abscess. No flexor TTP.  SENSORY: Sensation is intact to light touch in:  Superficial radial nerve distribution (dorsal first web space) Median nerve distribution (tip of index finger)   Ulnar nerve distribution (tip of small finger)     MOTOR:  + Motor  posterior interosseous nerve (thumb IP extension) + Anterior interosseous nerve (thumb IP flexion, index finger DIP flexion) + Radial nerve (wrist extension) + Median nerve (palpable firing thenar mass) + Ulnar nerve (palpable firing of first dorsal interosseous muscle)  VASCULAR: 2+ radial pulse Brisk capillary refill < 2 sec, fingers warm and well-perfused   COMPARTMENTS: Soft, warm, well-perfused No pain with passive extension No paresthesias           ED Treatments / Results  Labs (all labs ordered are listed, but only abnormal results are displayed) Labs Reviewed  COMPREHENSIVE METABOLIC PANEL - Abnormal; Notable for the following:       Result Value   Chloride 100 (*)    All other components within normal limits  SEDIMENTATION RATE - Abnormal; Notable for the following:    Sed Rate 27 (*)    All other components within normal limits  C-REACTIVE PROTEIN - Abnormal; Notable for the following:    CRP 4.3 (*)    All other components within normal limits  CULTURE, BLOOD (ROUTINE X 2)  CULTURE, BLOOD (ROUTINE X 2)  CBC WITH DIFFERENTIAL/PLATELET  CK    EKG  EKG Interpretation None       Radiology Dg Hand  Complete Left  Result Date: 08/09/2016 CLINICAL DATA:  Dog bite several days ago with persistent pain and swelling, initial encounter EXAM: LEFT HAND - COMPLETE 3+ VIEW COMPARISON:  None. FINDINGS: Mild soft tissue swelling is noted about the metacarpals consistent with the recent injury. No acute fracture or dislocation is seen. No radiopaque foreign body is noted. IMPRESSION: Soft tissue swelling without acute bony abnormality. Electronically Signed   By: Inez Catalina M.D.   On: 08/09/2016 16:50    Procedures Procedures (including critical care time)  Medications Ordered in ED Medications  Ampicillin-Sulbactam (UNASYN) 3 g in sodium chloride 0.9 % 100 mL IVPB (3 g Intravenous New Bag/Given 08/09/16 1744)  vancomycin (VANCOCIN) 1,750 mg in sodium chloride 0.9 % 500 mL IVPB (not administered)  vancomycin (VANCOCIN) IVPB 1000 mg/200 mL premix (not administered)  ketorolac (TORADOL) 15 MG/ML injection 15 mg (15 mg Intravenous Given 08/09/16 1748)  HYDROcodone-acetaminophen (NORCO/VICODIN) 5-325 MG per tablet 1 tablet (1 tablet Oral Given 08/09/16 1748)     Initial Impression / Assessment and Plan / ED Course  I have reviewed the triage vital signs and the nursing notes.  Pertinent labs & imaging results that were available during my care of the patient were reviewed by me and considered in my medical decision making (see chart for details).    76 yo right hand dominant F with PMHx as above here with worsening b/l hand redness, swelling, and streaking erythema s/p dog bite, despite outpt Keflex and dose of Rocephin. VSS. No fever or signs of systemic illness. Exam is as above, concerning for worsening cellulitis of bilateral hands. Given age, failure of outpt Rocephin/Keflex, will treat with MRSA coverage with Vanc, add Unasyn for anaerobe/Pastuerella coverage. Given worsening in 12 hr with ascending pain and streaking erythema, with involvement of b/l hands, believe pt should be observed for  improvement.   Final Clinical Impressions(s) / ED Diagnoses   Final diagnoses:  Dog bite of left hand, initial encounter  Dog bite of right hand, initial encounter  Cellulitis of hand      Duffy Bruce, MD 08/09/16 [phone]

## 2016-08-09 NOTE — Progress Notes (Signed)
To floor from ED via stretcher. No complaints. Belongings with patient. Ambulatory. IV infusing. Supper tray ordered. Resting comfortably.

## 2016-08-10 DIAGNOSIS — S61459D Open bite of unspecified hand, subsequent encounter: Secondary | ICD-10-CM | POA: Diagnosis not present

## 2016-08-10 DIAGNOSIS — W540XXD Bitten by dog, subsequent encounter: Secondary | ICD-10-CM | POA: Diagnosis not present

## 2016-08-10 LAB — CBC
HCT: 38.5 % (ref 36.0–46.0)
Hemoglobin: 12.3 g/dL (ref 12.0–15.0)
MCH: 27.2 pg (ref 26.0–34.0)
MCHC: 31.9 g/dL (ref 30.0–36.0)
MCV: 85 fL (ref 78.0–100.0)
Platelets: 207 10*3/uL (ref 150–400)
RBC: 4.53 MIL/uL (ref 3.87–5.11)
RDW: 13.6 % (ref 11.5–15.5)
WBC: 5.9 10*3/uL (ref 4.0–10.5)

## 2016-08-10 LAB — COMPREHENSIVE METABOLIC PANEL
ALT: 15 U/L (ref 14–54)
AST: 15 U/L (ref 15–41)
Albumin: 3 g/dL — ABNORMAL LOW (ref 3.5–5.0)
Alkaline Phosphatase: 38 U/L (ref 38–126)
Anion gap: 8 (ref 5–15)
BUN: 19 mg/dL (ref 6–20)
CO2: 29 mmol/L (ref 22–32)
Calcium: 8.7 mg/dL — ABNORMAL LOW (ref 8.9–10.3)
Chloride: 103 mmol/L (ref 101–111)
Creatinine, Ser: 1.05 mg/dL — ABNORMAL HIGH (ref 0.44–1.00)
GFR calc Af Amer: 60 mL/min — ABNORMAL LOW (ref >60)
GFR calc non Af Amer: 51 mL/min — ABNORMAL LOW (ref >60)
Glucose, Bld: 94 mg/dL (ref 65–99)
Potassium: 3.9 mmol/L (ref 3.5–5.1)
Sodium: 140 mmol/L (ref 135–145)
Total Bilirubin: 0.9 mg/dL (ref 0.3–1.2)
Total Protein: 5.3 g/dL — ABNORMAL LOW (ref 6.5–8.1)

## 2016-08-10 MED ORDER — AMOXICILLIN-POT CLAVULANATE 875-125 MG PO TABS: 1.0000 | ORAL_TABLET | Freq: Two times a day (BID) | ORAL | 0 refills | Status: DC

## 2016-08-10 NOTE — Discharge Summary (Signed)
Physician Discharge Summary  Krystal Mcpherson [id number] DOB: 02-07-1941 DOA: 08/09/2016  PCP: Judge Stall, FNP  Admit date: 08/09/2016 Discharge date: 08/10/2016  Time spent: 35 minutes  Recommendations for Outpatient Follow-up:  PCP in 1 week  Discharge Diagnoses:  Principal Problem:   Dog bite, hand, unspecified laterality, subsequent encounter   Hand cellulitis   Essential hypertension, benign   Cellulitis   Discharge Condition:stable  Diet recommendation: heart healthy  Filed Weights   08/09/16 1600 08/09/16 2027  Weight: 90.7 kg (200 lb) 96.1 kg (211 lb 14.4 oz)    History of present illness:  Krystal Mcpherson is a 76 y.o. female with Past medical history of hypertension, GERD, CAD. Patient presented with complaints of worsening of wound that she got from dog bite. 08/06/2015 patient was bitten by her dog, on both hands.. She went to see her PCP the next day and given a shot of IV Rocephin followed by a course of oral Keflex. She had significant swelling of her upper extremities at that time which is gradually improving. She noticed that the redness around the wound is not improving and pain is also increasing. In the left arm. She noticed some streaking from her right arm and then decided to come to the hospital.  Hospital Course:    Cellulitis of bilateral hand following dog bites -s/p Keflex for few days prior to admission -improved, swelling and redness and hand/finger mobility improved -no fever/leukocytosis, no evidence of abscess or compartment syndrome of hand/forearm -traeted with IV Unasyn and vancomycin overnight with improvement and discharged home on oral Augmentin. Advised FU with PCP in 3-4days. -dog is vaccinated per pt and she is uptodate with tetanus shot  Active Problems:   Essential hypertension, benign Continuing home medication, resumed HCTZ    History of CAD. Continue aspirin.    History of GERD. Continuing Pepcid.   Discharge  Exam: Vitals:   08/10/16 0456 08/10/16 1044  BP: (!) 161/44 (!) 153/46  Pulse: 81 (!) 52  Resp: 18 18  Temp: 98.6 F (37 C) 97.5 F (36.4 C)    General: AAOx3 Cardiovascular: S1S2/RRR Respiratory: CTAB  Discharge Instructions   Discharge Instructions    Diet - low sodium heart healthy    Complete by:  As directed    Increase activity slowly    Complete by:  As directed      Current Discharge Medication List    START taking these medications   Details  amoxicillin-clavulanate (AUGMENTIN) 875-125 MG tablet Take 1 tablet by mouth 2 (two) times daily. For 5days Qty: 10 tablet, Refills: 0      CONTINUE these medications which have NOT CHANGED   Details  acetaminophen (TYLENOL) 500 MG tablet Take 1,000 mg by mouth every 6 (six) hours as needed (pain).    albuterol (PROVENTIL HFA;VENTOLIN HFA) 108 (90 BASE) MCG/ACT inhaler Inhale 2 puffs into the lungs every 4 (four) hours as needed for wheezing or shortness of breath. Reported on 10/30/2015    aspirin EC 81 MG tablet Take 81 mg by mouth daily.    atenolol (TENORMIN) 25 MG tablet Take 12.5 mg by mouth daily.     cetirizine (ZYRTEC) 10 MG tablet Take 10 mg by mouth daily.    diphenhydrAMINE (BENADRYL) 25 mg capsule Take 25-50 mg by mouth every 6 (six) hours as needed for itching. Reported on 10/30/2015    fluticasone (FLONASE) 50 MCG/ACT nasal spray Place 2 sprays into both nostrils as needed for allergies or rhinitis.  ibuprofen (ADVIL,MOTRIN) 200 MG tablet Take 400-600 mg by mouth every 6 (six) hours as needed for headache (pain).    losartan-hydrochlorothiazide (HYZAAR) 100-12.5 MG per tablet Take 1 tablet by mouth daily.    neomycin-bacitracin-polymyxin (NEOSPORIN) OINT Apply 1 application topically 3 (three) times daily as needed for wound care.    nitroGLYCERIN (NITROSTAT) 0.4 MG SL tablet Place 1 tablet (0.4 mg total) under the tongue every 5 (five) minutes as needed for chest pain. Qty: 25 tablet, Refills: prn     RaNITidine HCl (ZANTAC PO) Take 1 tablet by mouth daily as needed (acid reflux).    traMADol (ULTRAM) 50 MG tablet Take 50 mg by mouth every 6 (six) hours as needed (pain). Reported on 11/13/2015      STOP taking these medications     cephALEXin (KEFLEX) 500 MG capsule        Allergies  Allergen Reactions  . Atenolol     Increased dosages cause bradycardia.  Patient can take atenolol 25 mg with no problems.    . Atorvastatin     Muscle pain   . Ciprofloxacin Hives  . Fentanyl     Itching all over   . Morphine And Related     Hallucinations   . Tetanus Toxoids     Localized knot   . Augmentin [Amoxicillin-Pot Clavulanate] Nausea And Vomiting    Pt has taken Keflex (cephalexin) without adverse reaction. Started 08/06/16      The results of significant diagnostics from this hospitalization (including imaging, microbiology, ancillary and laboratory) are listed below for reference.    Significant Diagnostic Studies: Dg Hand Complete Left  Result Date: 08/09/2016 CLINICAL DATA:  Dog bite several days ago with persistent pain and swelling, initial encounter EXAM: LEFT HAND - COMPLETE 3+ VIEW COMPARISON:  None. FINDINGS: Mild soft tissue swelling is noted about the metacarpals consistent with the recent injury. No acute fracture or dislocation is seen. No radiopaque foreign body is noted. IMPRESSION: Soft tissue swelling without acute bony abnormality. Electronically Signed   By: Inez Catalina M.D.   On: 08/09/2016 16:50    Microbiology: Recent Results (from the past 240 hour(s))  Blood culture (routine x 2)     Status: None (Preliminary result)   Collection Time: 08/09/16  5:32 PM  Result Value Ref Range Status   Specimen Description BLOOD RIGHT ANTECUBITAL  Final   Special Requests BOTTLES DRAWN AEROBIC AND ANAEROBIC 3CC  Final   Culture PENDING  Incomplete   Report Status PENDING  Incomplete     Labs: Basic Metabolic Panel:  Recent Labs Lab 08/09/16 1624  08/10/16 0532  NA 139 140  K 3.9 3.9  CL 100* 103  CO2 25 29  GLUCOSE 93 94  BUN 15 19  CREATININE 0.81 1.05*  CALCIUM 9.2 8.7*   Liver Function Tests:  Recent Labs Lab 08/09/16 1624 08/10/16 0532  AST 19 15  ALT 17 15  ALKPHOS 45 38  BILITOT 0.6 0.9  PROT 6.8 5.3*  ALBUMIN 3.7 3.0*   No results for input(s): LIPASE, AMYLASE in the last 168 hours. No results for input(s): AMMONIA in the last 168 hours. CBC:  Recent Labs Lab 08/09/16 1624 08/10/16 0532  WBC 6.6 5.9  NEUTROABS 3.4  --   HGB 13.7 12.3  HCT 42.3 38.5  MCV 85.3 85.0  PLT 237 207   Cardiac Enzymes:  Recent Labs Lab 08/09/16 1745  CKTOTAL 77   BNP: BNP (last 3 results) No results for input(s):  BNP in the last 8760 hours.  ProBNP (last 3 results) No results for input(s): PROBNP in the last 8760 hours.  CBG: No results for input(s): GLUCAP in the last 168 hours.     SignedDomenic Polite MD.  [facility] 08/10/2016, 12:42 PM

## 2016-08-10 NOTE — Progress Notes (Signed)
Patient discharged to home with family member. IV removed. All discharge instructions reviewed along with script for antibiotic. Patient expressed understanding of discharge. All belongings with patient. Patient left the unit in stable condition in wheelchair.  Sheliah Plane RN

## 2016-08-10 NOTE — Care Management Obs Status (Signed)
MEDICARE OBSERVATION STATUS NOTIFICATION   Patient Details  Name: Krystal Mcpherson MRN: [id number] Date of Birth: Sep 13, 1940   Medicare Observation Status Notification Given:  Yes    Sharin Mons, RN 08/10/2016, 11:25 AM

## 2016-08-14 LAB — CULTURE, BLOOD (ROUTINE X 2)
Culture: NO GROWTH
Culture: NO GROWTH

## 2016-09-03 ENCOUNTER — Other Ambulatory Visit: Payer: Self-pay | Admitting: Gynecology

## 2016-09-03 DIAGNOSIS — N63 Unspecified lump in unspecified breast: Secondary | ICD-10-CM

## 2016-09-08 ENCOUNTER — Ambulatory Visit
Admission: RE | Admit: 2016-09-08 | Discharge: 2016-09-08 | Disposition: A | Discharge status: Home / Self Care | Payer: Medicare Other | Source: Ambulatory Visit | Attending: Gynecology | Admitting: Gynecology

## 2016-09-08 ENCOUNTER — Other Ambulatory Visit: Payer: Self-pay | Admitting: Gynecology

## 2016-09-08 DIAGNOSIS — N63 Unspecified lump in unspecified breast: Secondary | ICD-10-CM

## 2016-11-20 ENCOUNTER — Ambulatory Visit (HOSPITAL_BASED_OUTPATIENT_CLINIC_OR_DEPARTMENT_OTHER)
Admission: RE | Admit: 2016-11-20 | Discharge: 2016-11-20 | Disposition: A | Discharge status: Home / Self Care | Payer: Medicare Other | Source: Ambulatory Visit | Attending: Internal Medicine | Admitting: Internal Medicine

## 2016-11-20 ENCOUNTER — Other Ambulatory Visit (HOSPITAL_BASED_OUTPATIENT_CLINIC_OR_DEPARTMENT_OTHER): Payer: Self-pay | Admitting: Internal Medicine

## 2016-11-20 ENCOUNTER — Encounter (HOSPITAL_BASED_OUTPATIENT_CLINIC_OR_DEPARTMENT_OTHER): Payer: Self-pay

## 2016-11-20 DIAGNOSIS — R1032 Left lower quadrant pain: Secondary | ICD-10-CM | POA: Insufficient documentation

## 2016-11-20 DIAGNOSIS — Z9049 Acquired absence of other specified parts of digestive tract: Secondary | ICD-10-CM | POA: Diagnosis not present

## 2016-11-20 DIAGNOSIS — K449 Diaphragmatic hernia without obstruction or gangrene: Secondary | ICD-10-CM | POA: Diagnosis not present

## 2016-11-20 DIAGNOSIS — M5136 Other intervertebral disc degeneration, lumbar region: Secondary | ICD-10-CM | POA: Insufficient documentation

## 2016-11-20 DIAGNOSIS — Z90710 Acquired absence of both cervix and uterus: Secondary | ICD-10-CM | POA: Insufficient documentation

## 2016-11-20 DIAGNOSIS — I700 Atherosclerosis of aorta: Secondary | ICD-10-CM | POA: Insufficient documentation

## 2016-11-20 DIAGNOSIS — R1084 Generalized abdominal pain: Secondary | ICD-10-CM

## 2016-11-20 DIAGNOSIS — K529 Noninfective gastroenteritis and colitis, unspecified: Secondary | ICD-10-CM | POA: Diagnosis not present

## 2016-11-20 MED ORDER — IOPAMIDOL (ISOVUE-300) INJECTION 61%
100.0000 mL | Freq: Once | INTRAVENOUS | Status: AC | PRN
  Administered 2016-11-20: 100 mL via INTRAVENOUS

## 2016-12-11 ENCOUNTER — Other Ambulatory Visit: Payer: Self-pay | Admitting: Gastroenterology

## 2016-12-11 DIAGNOSIS — R109 Unspecified abdominal pain: Secondary | ICD-10-CM

## 2016-12-15 ENCOUNTER — Ambulatory Visit
Admission: RE | Admit: 2016-12-15 | Discharge: 2016-12-15 | Disposition: A | Discharge status: Home / Self Care | Payer: Medicare Other | Source: Ambulatory Visit | Attending: Gastroenterology | Admitting: Gastroenterology

## 2016-12-15 DIAGNOSIS — R109 Unspecified abdominal pain: Secondary | ICD-10-CM

## 2016-12-15 MED ORDER — IOPAMIDOL (ISOVUE-370) INJECTION 76%
75.0000 mL | Freq: Once | INTRAVENOUS | Status: AC | PRN
  Administered 2016-12-15: 75 mL via INTRAVENOUS

## 2017-07-22 ENCOUNTER — Telehealth: Payer: Self-pay | Admitting: Cardiology

## 2017-07-22 NOTE — Telephone Encounter (Signed)
Left message for pt - will attempt to c/b tomorrow.

## 2017-07-22 NOTE — Telephone Encounter (Signed)
New message  Pt verbalized that she is calling for RN  Pt did not want to speak to me about reason for call  Pt verbalized that she want to speak to rn about her symptoms

## 2017-07-23 MED ORDER — NITROGLYCERIN 0.4 MG SL SUBL: 0.4000 mg | SUBLINGUAL_TABLET | SUBLINGUAL | 6 refills | Status: DC | PRN

## 2017-07-23 NOTE — Telephone Encounter (Signed)
Spoke with patient who reports she woke Wednesday AM about 4 with left arm pain and some discomfort in the center of her chest.  She reports this is the way her s/s started prior to her previous stent.  She denies any SOB, N/V, radiation or diaphoresis.  She reports it was just hard to get her arm in a good position to go back to sleep.  Her arm did not hurt with movement and nothing she did really made a difference.  She reports she was awake for about 1 hour and then just fell back to sleep.  The only issue she has had since then has been an occasional discomfort in her chest.  appt scheduled for eval with Dr Marlou Porch at his next available and pt was instructed to call 911 for evaluation prior to this appt if chest discomfort reoccurs.  She states understanding, was grateful for the call and in agreement with the plan.

## 2017-08-03 ENCOUNTER — Telehealth (HOSPITAL_COMMUNITY): Payer: Self-pay | Admitting: *Deleted

## 2017-08-03 ENCOUNTER — Ambulatory Visit: Payer: Medicare Other | Admitting: Cardiology

## 2017-08-03 VITALS — BP 134/70 | HR 66 | Ht 65.5 in | Wt 204.8 lb

## 2017-08-03 DIAGNOSIS — I208 Other forms of angina pectoris: Secondary | ICD-10-CM

## 2017-08-03 NOTE — Progress Notes (Signed)
[facility]. [address], [address], [place]  [address] Phone: [phone] Fax:  [phone]  Date:  08/03/2017   ID:  Krystal Mcpherson, Krystal Mcpherson 1940/09/25, MRN [id number]  PCP:  Judge Stall, FNP   History of Present Illness: Krystal Mcpherson is a 77 y.o. female with coronary artery disease status post percutaneous intervention to the right coronary artery in November of 2012 with overall normal ejection fraction, moderate hypokinesis of the inferior wall here for followup. For 2 months prior to stent had burning CP, GERD like, left arm.   Previously she felt jittery ago she takes her atenolol in the morning. In February of 2013, nuclear is low risk. Repeat nuclear stress test on 08/07/14 was also low risk, reassuring after she was having GERD-like chest discomfort. Her 24-hour Holter monitor showed paroxysmal atrial tachycardia. She has had musculoskeletal chest discomfort. Doing yard work, no symptoms.  [employer].   Unfortunately had a fall, hurt her back.  Dr. Tonita Cong. she is also having some headaches, seeing neurologist.  08/03/17  - left arm pain, burning in chest, one night severe. Similar to prior to RCA stent.  Back and hip pain. Hard to walk. Swim and bike ok for her. Worried about CP. Does not wish to take statins.   Wt Readings from Last 3 Encounters:  08/03/17 204 lb 12.8 oz (92.9 kg)  08/09/16 211 lb 14.4 oz (96.1 kg)  11/13/15 210 lb (95.3 kg)     Past Medical History:  Diagnosis Date  . Allergy   . Anemia    Hx  . Arthritis    neck, lower back  . Change in bowel habits   . Complication of anesthesia   . GERD (gastroesophageal reflux disease)   . HLD (hyperlipidemia)    hx - no med  . HTN (hypertension)   . Migraine   . PONV (postoperative nausea and vomiting)   . Stented coronary artery    right, no problems  . SVD (spontaneous vaginal delivery)    x 2  . Vitamin B12 deficiency     Past Surgical History:  Procedure Laterality Date  . bladder tact    .  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 CHOLECYSTECTOMY    . COLONOSCOPY  2009    [facility]  . cornary stent Right 05/2011  . NASAL SINUS SURGERY     x 2  . TOTAL ABDOMINAL HYSTERECTOMY W/ BILATERAL SALPINGOOPHORECTOMY      Current Outpatient Medications  Medication Sig Dispense Refill  . acetaminophen (TYLENOL) 500 MG tablet Take 1,000 mg by mouth every 6 (six) hours as needed (pain).    Marland Kitchen albuterol (PROVENTIL HFA;VENTOLIN HFA) 108 (90 BASE) MCG/ACT inhaler Inhale 2 puffs into the lungs every 4 (four) hours as needed for wheezing or shortness of breath. Reported on 10/30/2015    . aspirin EC 81 MG tablet Take 81 mg by mouth daily.    Marland Kitchen atenolol (TENORMIN) 25 MG tablet Take 12.5 mg by mouth daily.     . cetirizine (ZYRTEC) 10 MG tablet Take 10 mg by mouth daily.    . diphenhydrAMINE (BENADRYL) 25 mg capsule Take 25-50 mg by mouth every 6 (six) hours as needed for itching. Reported on 10/30/2015    . fluticasone (FLONASE) 50 MCG/ACT nasal spray Place 2 sprays into both nostrils as needed for allergies or rhinitis.    Marland Kitchen ibuprofen (ADVIL,MOTRIN) 200 MG tablet Take 400-600 mg by mouth  every 6 (six) hours as needed for headache (pain).    Marland Kitchen neomycin-bacitracin-polymyxin (NEOSPORIN) OINT Apply 1 application topically 3 (three) times daily as needed for wound care.    . nitroGLYCERIN (NITROSTAT) 0.4 MG SL tablet Place 1 tablet (0.4 mg total) under the tongue every 5 (five) minutes as needed for chest pain. 25 tablet 6  . olmesartan-hydrochlorothiazide (BENICAR HCT) 40-25 MG tablet Take 1 tablet by mouth daily.    . RaNITidine HCl (ZANTAC PO) Take 1 tablet by mouth daily as needed (acid reflux).    . traMADol (ULTRAM) 50 MG tablet Take 50 mg by mouth every 6 (six) hours as needed (pain). Reported on 11/13/2015     No current facility-administered medications for this visit.     Allergies:    Allergies  Allergen Reactions  . Atenolol     Increased dosages cause bradycardia.   Patient can take atenolol 25 mg with no problems.    . Atorvastatin     Muscle pain   . Ciprofloxacin Hives  . Fentanyl     Itching all over   . Morphine And Related     Hallucinations   . Tetanus Toxoids     Localized knot   . Augmentin [Amoxicillin-Pot Clavulanate] Nausea And Vomiting    Pt has taken Keflex (cephalexin) without adverse reaction. Started 08/06/16    Social History:  The patient  reports that  has never smoked. she has never used smokeless tobacco. She reports that she does not drink alcohol or use drugs.   Family History  Problem Relation Age of Onset  . Colon cancer Paternal Grandmother   . Hypertension Mother   . Heart attack Mother   . Hypertension Father   . Heart attack Father   . Arthritis Sister   . Obesity Sister   . Hypertension Sister   . Thyroid cancer Sister   . Kidney cancer Sister   . Breast cancer Sister   . Hypertension Sister   . Pancreatitis Sister   . Melanoma Sister   . Hypertension Sister   . Esophageal cancer Neg Hx   . Rectal cancer Neg Hx   . Stomach cancer Neg Hx   Mother and Father both had MI  ROS:  Please see the history of present illness.  All other neg.   PHYSICAL EXAM: VS:  BP 134/70   Pulse 66   Ht 5' 5.5" (1.664 m)   Wt 204 lb 12.8 oz (92.9 kg)   BMI 33.56 kg/m  GEN: Well nourished, well developed, in no acute distress, obese HEENT: normal  Neck: no JVD, carotid bruits, or masses Cardiac: RRR; no murmurs, rubs, or gallops,no edema  Respiratory:  clear to auscultation bilaterally, normal work of breathing GI: soft, nontender, nondistended, + BS MS: no deformity or atrophy  Skin: warm and dry, no rash Neuro:  Alert and Oriented x 3, Strength and sensation are intact Psych: euthymic mood, full affect   EKG:  08/01/14-sinus rhythm, 56, prior 03/02/14-    sinus bradycardia rate 50 with no other abnormalities   Nuclear stress: 08/07/14- low risk, no ischemia , reassuring.  LDL 109  ASSESSMENT AND  PLAN:  1. Coronary artery currently describing symptoms, somewhat atypical, difficult to discern between GERD, MSK, heart. She thinks it may feel somewhat like she felt prior to her RCA stent once again. Prior nuclear stress test in 2013 and 2016 was low risk. We will go ahead and set her up for another stress test. She  has nitroglycerin if necessary, refilled. No room to increase her atenolol because of bradycardia. If symptoms worsen or become more worrisome, emergency room. Secondary prevention. 2. History right coronary artery stent.  We discussed secondary prevention with statin use. She was able to tolerate pravastatin the past however she does not wish to take this medication because of reports of association with Alzheimer's. Tried to reassure her but she still does not wish to take medication. 3. Obesity - improved 4. Bradycardia - she is continuing low-dose atenolol. No increase.no change 5. Hypertension-currently well controlled, medications reviewed. OK 6. Hyperlipidemia-unfortunately she does not wish to retry statins at this time. LDL goal 70. Understands risks. 7. 6 month follow-up Mickel Baas, 14 with me.   Signed, Candee Furbish, MD [facility]  08/03/2017 12:08 PM

## 2017-08-03 NOTE — Telephone Encounter (Signed)
Left message on voicemail in reference to upcoming appointment scheduled for 08/05/17. Phone number given for a call back so details instructions can be given. Raymel Cull, Ranae Palms

## 2017-08-03 NOTE — Patient Instructions (Signed)
Medication Instructions:  The current medical regimen is effective;  continue present plan and medications.  Testing/Procedures: Your physician has requested that you have a lexiscan myoview. For further information please visit [email or web address]. Please follow instruction sheet, as given.  Follow-Up: Follow up in 6 months with Lajuana Matte, NP.  You will receive a letter in the mail 2 months before you are due.  Please call us when you receive this letter to schedule your follow up appointment.  Follow up in 1 year with Dr. Marlou Porch.  You will receive a letter in the mail 2 months before you are due.  Please call us when you receive this letter to schedule your follow up appointment.  If you need a refill on your cardiac medications before your next appointment, please call your pharmacy.  Thank you for choosing [facility]!!

## 2017-08-04 ENCOUNTER — Telehealth (HOSPITAL_COMMUNITY): Payer: Self-pay

## 2017-08-04 ENCOUNTER — Encounter: Payer: Self-pay | Admitting: Cardiology

## 2017-08-04 NOTE — Telephone Encounter (Signed)
Patient given detailed instructions per Myocardial Perfusion Study Information Sheet for the test on 08/05/17 at 0745. Patient notified to arrive 15 minutes early and that it is imperative to arrive on time for appointment to keep from having the test rescheduled.  If you need to cancel or reschedule your appointment, please call the office within 24 hours of your appointment. . Patient verbalized understanding. TMY

## 2017-08-05 ENCOUNTER — Ambulatory Visit (HOSPITAL_COMMUNITY): Payer: Medicare Other | Attending: Internal Medicine

## 2017-08-05 DIAGNOSIS — I208 Other forms of angina pectoris: Secondary | ICD-10-CM | POA: Diagnosis not present

## 2017-08-05 LAB — MYOCARDIAL PERFUSION IMAGING
LV dias vol: 83 mL (ref 46–106)
LV sys vol: 32 mL
Peak HR: 77 {beats}/min
RATE: 0.26
Rest HR: 49 {beats}/min
SDS: 2
SRS: 0
SSS: 2
TID: 1.03

## 2017-08-05 MED ORDER — TECHNETIUM TC 99M TETROFOSMIN IV KIT
31.9000 | PACK | Freq: Once | INTRAVENOUS | Status: AC | PRN
  Administered 2017-08-05: 31.9 via INTRAVENOUS
  Filled 2017-08-05: qty 32

## 2017-08-05 MED ORDER — REGADENOSON 0.4 MG/5ML IV SOLN
0.4000 mg | Freq: Once | INTRAVENOUS | Status: AC
  Administered 2017-08-05: 0.4 mg via INTRAVENOUS

## 2017-08-05 MED ORDER — TECHNETIUM TC 99M TETROFOSMIN IV KIT
10.4000 | PACK | Freq: Once | INTRAVENOUS | Status: AC | PRN
  Administered 2017-08-05: 10.4 via INTRAVENOUS
  Filled 2017-08-05: qty 11

## 2017-11-17 ENCOUNTER — Other Ambulatory Visit: Payer: Self-pay | Admitting: Gynecology

## 2017-11-17 DIAGNOSIS — Z139 Encounter for screening, unspecified: Secondary | ICD-10-CM

## 2017-11-17 DIAGNOSIS — N289 Disorder of kidney and ureter, unspecified: Secondary | ICD-10-CM

## 2017-11-17 DIAGNOSIS — E2839 Other primary ovarian failure: Secondary | ICD-10-CM

## 2017-11-22 ENCOUNTER — Ambulatory Visit
Admission: RE | Admit: 2017-11-22 | Discharge: 2017-11-22 | Disposition: A | Discharge status: Home / Self Care | Payer: Medicare Other | Source: Ambulatory Visit | Attending: Gynecology | Admitting: Gynecology

## 2017-11-22 DIAGNOSIS — N289 Disorder of kidney and ureter, unspecified: Secondary | ICD-10-CM

## 2017-12-30 ENCOUNTER — Inpatient Hospital Stay
Admission: RE | Admit: 2017-12-30 | Discharge: 2017-12-30 | Disposition: A | Discharge status: Home / Self Care | Payer: Medicare Other | Source: Ambulatory Visit | Attending: Gynecology | Admitting: Gynecology

## 2017-12-30 ENCOUNTER — Ambulatory Visit: Payer: Medicare Other

## 2018-10-20 ENCOUNTER — Encounter: Payer: Self-pay | Admitting: Cardiology

## 2018-10-20 ENCOUNTER — Other Ambulatory Visit: Payer: Self-pay

## 2018-10-20 ENCOUNTER — Telehealth (INDEPENDENT_AMBULATORY_CARE_PROVIDER_SITE_OTHER): Payer: Medicare Other | Admitting: Cardiology

## 2018-10-20 VITALS — BP 119/68 | HR 60 | Ht 65.5 in | Wt 204.0 lb

## 2018-10-20 DIAGNOSIS — I208 Other forms of angina pectoris: Secondary | ICD-10-CM | POA: Diagnosis not present

## 2018-10-20 DIAGNOSIS — G43019 Migraine without aura, intractable, without status migrainosus: Secondary | ICD-10-CM

## 2018-10-20 DIAGNOSIS — R000 Tachycardia, unspecified: Secondary | ICD-10-CM

## 2018-10-20 NOTE — Patient Instructions (Signed)
Medication Instructions:  The current medical regimen is effective;  continue present plan and medications. You may take an extra Atenolol for a rapid heart rate as needed.  If you need a refill on your cardiac medications before your next appointment, please call your pharmacy.   Testing/Procedures: Your physician has recommended that you wear a holter monitor for 14 days.  This will be mailed to your home with complete instructions on how to use it.  Holter monitors are medical devices that record the heart's electrical activity. Doctors most often use these monitors to diagnose arrhythmias. Arrhythmias are problems with the speed or rhythm of the heartbeat. The monitor is a small, portable device. You can wear one while you do your normal daily activities. This is usually used to diagnose what is causing palpitations/syncope (passing out).  Follow-Up: Follow up in 6 months with Dr. Marlou Porch.  You will receive a letter in the mail 2 months before you are due.  Please call us when you receive this letter to schedule your follow up appointment.  Thank you for choosing [facility]!!

## 2018-10-20 NOTE — Progress Notes (Signed)
Virtual Visit via Video Note   This visit type was conducted due to national recommendations for restrictions regarding the COVID-19 Pandemic (e.g. social distancing) in an effort to limit this patient's exposure and mitigate transmission in our community.  Due to her co-morbid illnesses, this patient is at least at moderate risk for complications without adequate follow up.  This format is felt to be most appropriate for this patient at this time.  All issues noted in this document were discussed and addressed.  A limited physical exam was performed with this format.  Please refer to the patient's chart for her consent to telehealth for [facility].   Evaluation Performed:  Follow-up visit  Date:  10/20/2018   ID:  Krystal, Mcpherson 12-02-1940, MRN [id number]  Patient Location: Home Provider Location: Home  PCP:  Rolene Course, PA-C  Cardiologist:  Candee Furbish, MD  Electrophysiologist:  None   Chief Complaint: Chest tightness, tachycardia, migraine headaches  History of Present Illness:    Krystal Mcpherson is a 78 y.o. female with CAD post remote right coronary artery stent with multiple nuclear stress test that are low risk with no perfusion defects who today states that since January has been battling with severe migraine headaches off and on.  She had an episode of migraine related vertigo where she fell in her kitchen.  She has been to the urgent care a few times where a cocktail of medications has been given with some relief.  Thankfully, she is recently seen a neurologist in [place] which has placed her back on Topamax.  This is helped a little bit.  With her migraines, she will occasionally feel some trouble breathing had rapid heart rate as well walking through the house with accompanied headache.  Heart rate is normally in the 60s but was up as high as 100 last Sunday.  She felt as though it was hard to breathe.  She lives with her 47 year old grandson who works at the [employer].  The patient does not have symptoms concerning for COVID-19 infection (fever, chills, cough, or new shortness of breath).    Past Medical History:  Diagnosis Date   Allergy    Anemia    Hx   Arthritis    neck, lower back   Change in bowel habits    Complication of anesthesia    GERD (gastroesophageal reflux disease)    HLD (hyperlipidemia)    hx - no med   HTN (hypertension)    Migraine    PONV (postoperative nausea and vomiting)    Stented coronary artery    right, no problems   SVD (spontaneous vaginal delivery)    x 2   Vitamin B12 deficiency    Past Surgical History:  Procedure Laterality Date   bladder tact     chiara malformation  2001   surgery for decompression   CHOLECYSTECTOMY     COLONOSCOPY  2009    [facility]   cornary stent Right 05/2011   NASAL SINUS SURGERY     x 2   TOTAL ABDOMINAL HYSTERECTOMY W/ BILATERAL SALPINGOOPHORECTOMY       Current Meds  Medication Sig   acetaminophen (TYLENOL) 500 MG tablet Take 1,000 mg by mouth every 6 (six) hours as needed (pain).   albuterol (PROVENTIL HFA;VENTOLIN HFA) 108 (90 BASE) MCG/ACT inhaler Inhale 2 puffs into the lungs every 4 (four) hours as needed for wheezing or shortness of breath. Reported on 10/30/2015   aspirin EC 81  MG tablet Take 81 mg by mouth daily.   atenolol (TENORMIN) 25 MG tablet Take 12.5 mg by mouth daily.    cetirizine (ZYRTEC) 10 MG tablet Take 10 mg by mouth daily.   diphenhydrAMINE (BENADRYL) 25 mg capsule Take 25-50 mg by mouth every 6 (six) hours as needed for itching. Reported on 10/30/2015   fluticasone (FLONASE) 50 MCG/ACT nasal spray Place 2 sprays into both nostrils as needed for allergies or rhinitis.   nitroGLYCERIN (NITROSTAT) 0.4 MG SL tablet Place 1 tablet (0.4 mg total) under the tongue every 5 (five) minutes as needed for chest pain.   olmesartan-hydrochlorothiazide (BENICAR HCT) 40-25 MG tablet Take 1 tablet by mouth daily.   topiramate  (TOPAMAX) 25 MG tablet Take 25 mg by mouth 2 (two) times daily.   traMADol (ULTRAM) 50 MG tablet Take 50 mg by mouth every 6 (six) hours as needed (pain). Reported on 11/13/2015     Allergies:   Metronidazole; Atenolol; Atorvastatin; Ciprofloxacin; Fentanyl; Morphine and related; Tetanus toxoids; and Augmentin [amoxicillin-pot clavulanate]   Social History   Tobacco Use   Smoking status: Never Smoker   Smokeless tobacco: Never Used  Substance Use Topics   Alcohol use: No    Alcohol/week: 0.0 standard drinks   Drug use: No     Family Hx: The patient's family history includes Arthritis in her sister; Breast cancer in her sister; Colon cancer in her paternal grandmother; Heart attack in her father and mother; Hypertension in her father, mother, sister, sister, and sister; Kidney cancer in her sister; Melanoma in her sister; Obesity in her sister; Pancreatitis in her sister; Thyroid cancer in her sister. There is no history of Esophageal cancer, Rectal cancer, or Stomach cancer.  ROS:   Please see the history of present illness.    Positive for headaches, occasional shortness of breath, occasional rapid heartbeat and chest tightness, no bleeding All other systems reviewed and are negative.   Prior CV studies:   The following studies were reviewed today:  NUC stress 08/05/17: Normal perfusion. LVEF 62% with normal wall motion. This is a low risk study.  Labs/Other Tests and Data Reviewed:    EKG:  An ECG dated 08/01/14 was personally reviewed today and demonstrated:  NSR  Recent Labs: No results found for requested labs within last 8760 hours.   Recent Lipid Panel No results found for: CHOL, TRIG, HDL, CHOLHDL, LDLCALC, LDLDIRECT  Wt Readings from Last 3 Encounters:  10/20/18 204 lb (92.5 kg)  08/05/17 204 lb (92.5 kg)  08/03/17 204 lb 12.8 oz (92.9 kg)     Objective:    Vital Signs:  BP 119/68    Pulse 60    Ht 5' 5.5" (1.664 m)    Wt 204 lb (92.5 kg)    BMI 33.43 kg/m     VITAL SIGNS:  reviewed  Able to complete full sentences without difficulty.  Alert and oriented, normal respiratory effort  ASSESSMENT & PLAN:    Occasional tachycardia, palpitations with associated chest tightness/angina - We will go ahead and set her up with a ZIO patch monitor for 14 days to make sure that she does not have any adverse arrhythmias such as atrial fibrillation. - She may take an additional atenolol if she is feeling tachycardia. - Certainly her migraines may be precipitating a natural pain response with increased heart rate. -I also reassured her that her recent stress test about a year ago was low risk with no ischemia and normal EF.  Obviously  if symptoms worsen or become more worrisome, we can always consider proceeding with invasive management.  Migraine headaches - She is very pleased with her new neurologist in [place].  Back on Topamax.  Coronary artery disease status post right coronary artery stent - Continue with secondary prevention, however she does not wish to take statin medication.  Worried about association with Alzheimer's.   COVID-19 Education: The signs and symptoms of COVID-19 were discussed with the patient and how to seek care for testing (follow up with PCP or arrange E-visit).  The importance of social distancing was discussed today.  Time:   Today, I have spent 16 minutes with the patient with telehealth technology discussing the above problems.     Medication Adjustments/Labs and Tests Ordered: Current medicines are reviewed at length with the patient today.  Concerns regarding medicines are outlined above.   Tests Ordered: Orders Placed This Encounter  Procedures   LONG TERM MONITOR (3-14 DAYS)    Medication Changes: No orders of the defined types were placed in this encounter.   Disposition:  Follow up in 6 month(s)  Signed, Candee Furbish, MD  10/20/2018 4:21 PM    [facility]

## 2018-10-21 ENCOUNTER — Telehealth: Payer: Medicare Other | Admitting: Cardiology

## 2018-10-26 ENCOUNTER — Telehealth: Payer: Self-pay | Admitting: Cardiology

## 2018-10-26 NOTE — Telephone Encounter (Signed)
The patient is concerned because she was ordered a monitor about 1 week ago and she has not received it yet.  I told her that I was not sure of the timeframe, but would f/u on it.

## 2018-10-26 NOTE — Telephone Encounter (Signed)
New Message:   Patient calling concering a monitor that she has not received. Please call patient.

## 2018-10-27 ENCOUNTER — Telehealth: Payer: Self-pay | Admitting: *Deleted

## 2018-10-27 NOTE — Telephone Encounter (Signed)
See telephone note from 4/23 from [facility].

## 2018-10-27 NOTE — Telephone Encounter (Signed)
LMVM  Apologize for delay in having monitor shipped to patient. Monitor department had not been notified of request.  I will enroll to have ZIO XT mailed today, however , I need to verify a shipping address.  We have a PO Box on record.  If we do not hear back, the monitor will be shipped to PO [address].  Please call office to verify.  You should receive monitor 5 days after enrolled.  Apply and wear monitor for 14 days as per instructions in kit.  Mail back to ZIO in same prepaid package.  Dr. Marlou Porch should have your results approximately 5 days after monitor returned.

## 2018-10-31 ENCOUNTER — Other Ambulatory Visit (INDEPENDENT_AMBULATORY_CARE_PROVIDER_SITE_OTHER): Payer: Medicare Other

## 2018-10-31 DIAGNOSIS — R000 Tachycardia, unspecified: Secondary | ICD-10-CM | POA: Diagnosis not present

## 2018-11-21 ENCOUNTER — Encounter: Payer: Self-pay | Admitting: Cardiology

## 2018-11-21 ENCOUNTER — Other Ambulatory Visit: Payer: Self-pay

## 2018-11-21 NOTE — Telephone Encounter (Signed)
error 

## 2018-11-23 ENCOUNTER — Other Ambulatory Visit: Payer: Self-pay | Admitting: *Deleted

## 2018-11-23 MED ORDER — ATENOLOL 25 MG PO TABS: 25.0000 mg | ORAL_TABLET | Freq: Every day | ORAL | 3 refills | Status: DC

## 2018-11-30 ENCOUNTER — Other Ambulatory Visit: Payer: Self-pay | Admitting: Neurology

## 2018-11-30 DIAGNOSIS — G43709 Chronic migraine without aura, not intractable, without status migrainosus: Secondary | ICD-10-CM

## 2018-11-30 DIAGNOSIS — IMO0002 Reserved for concepts with insufficient information to code with codable children: Secondary | ICD-10-CM

## 2018-11-30 DIAGNOSIS — R519 Headache, unspecified: Secondary | ICD-10-CM

## 2018-12-20 ENCOUNTER — Ambulatory Visit
Admission: RE | Admit: 2018-12-20 | Discharge: 2018-12-20 | Disposition: A | Discharge status: Home / Self Care | Payer: Medicare Other | Source: Ambulatory Visit | Attending: Neurology | Admitting: Neurology

## 2018-12-20 DIAGNOSIS — R519 Headache, unspecified: Secondary | ICD-10-CM

## 2018-12-20 DIAGNOSIS — IMO0002 Reserved for concepts with insufficient information to code with codable children: Secondary | ICD-10-CM

## 2018-12-20 DIAGNOSIS — G43709 Chronic migraine without aura, not intractable, without status migrainosus: Secondary | ICD-10-CM

## 2019-03-03 ENCOUNTER — Other Ambulatory Visit: Payer: Self-pay | Admitting: Obstetrics and Gynecology

## 2019-03-03 DIAGNOSIS — N6489 Other specified disorders of breast: Secondary | ICD-10-CM

## 2019-03-08 ENCOUNTER — Ambulatory Visit
Admission: RE | Admit: 2019-03-08 | Discharge: 2019-03-08 | Disposition: A | Discharge status: Home / Self Care | Payer: Medicare Other | Source: Ambulatory Visit | Attending: Obstetrics and Gynecology | Admitting: Obstetrics and Gynecology

## 2019-03-08 ENCOUNTER — Other Ambulatory Visit: Payer: Self-pay

## 2019-03-08 DIAGNOSIS — N6489 Other specified disorders of breast: Secondary | ICD-10-CM

## 2019-04-25 ENCOUNTER — Other Ambulatory Visit: Payer: Self-pay | Admitting: Physician Assistant

## 2019-04-25 DIAGNOSIS — R2232 Localized swelling, mass and lump, left upper limb: Secondary | ICD-10-CM

## 2019-04-25 DIAGNOSIS — M25532 Pain in left wrist: Secondary | ICD-10-CM

## 2019-04-26 ENCOUNTER — Ambulatory Visit
Admission: RE | Admit: 2019-04-26 | Discharge: 2019-04-26 | Disposition: A | Discharge status: Home / Self Care | Payer: Medicare Other | Source: Ambulatory Visit | Attending: Physician Assistant | Admitting: Physician Assistant

## 2019-04-26 DIAGNOSIS — R2232 Localized swelling, mass and lump, left upper limb: Secondary | ICD-10-CM

## 2019-04-26 DIAGNOSIS — M25532 Pain in left wrist: Secondary | ICD-10-CM

## 2019-08-06 ENCOUNTER — Ambulatory Visit: Payer: Medicare Other

## 2019-08-12 ENCOUNTER — Ambulatory Visit: Payer: Medicare PPO | Attending: Internal Medicine

## 2019-08-12 DIAGNOSIS — Z23 Encounter for immunization: Secondary | ICD-10-CM | POA: Insufficient documentation

## 2019-08-12 NOTE — Progress Notes (Signed)
   Covid-19 Vaccination Clinic  Name:  Krystal Mcpherson    MRN: [id number] DOB: April 04, 1941  08/12/2019  [name] was observed post Covid-19 immunization for 30 minutes based on pre-vaccination screening without incidence. She was provided with Vaccine Information Sheet and instruction to access the V-Safe system.   [name] was instructed to call 911 with any severe reactions post vaccine: Marland Kitchen Difficulty breathing  . Swelling of your face and throat  . A fast heartbeat  . A bad rash all over your body  . Dizziness and weakness    Immunizations Administered    Name Date Dose VIS Date Route   Pfizer COVID-19 Vaccine 08/12/2019 10:19 AM 0.3 mL 06/16/2019 Intramuscular   Manufacturer: [place]   Lot: [id number]   [place]: [id number]

## 2019-09-05 ENCOUNTER — Ambulatory Visit: Payer: Medicare PPO | Attending: Internal Medicine

## 2019-09-05 DIAGNOSIS — Z23 Encounter for immunization: Secondary | ICD-10-CM

## 2019-09-05 NOTE — Progress Notes (Signed)
   Covid-19 Vaccination Clinic  Name:  Krystal Mcpherson    MRN: [id number] DOB: 08/26/1940  09/05/2019  Krystal Mcpherson was observed post Covid-19 immunization for 15 minutes without incident. She was provided with Vaccine Information Sheet and instruction to access the V-Safe system.   Krystal Mcpherson was instructed to call 911 with any severe reactions post vaccine: Marland Kitchen Difficulty breathing  . Swelling of face and throat  . A fast heartbeat  . A bad rash all over body  . Dizziness and weakness   Immunizations Administered    Name Date Dose VIS Date Route   Pfizer COVID-19 Vaccine 09/05/2019 11:40 AM 0.3 mL 06/16/2019 Intramuscular   Manufacturer: [place]   Lot: [id number]   [place]: [id number]

## 2019-09-11 NOTE — Progress Notes (Signed)
Virtual Visit via Telephone Note   This visit type was conducted due to national recommendations for restrictions regarding the COVID-19 Pandemic (e.g. social distancing) in an effort to limit this patient's exposure and mitigate transmission in our community.  Due to her co-morbid illnesses, this patient is at least at moderate risk for complications without adequate follow up.  This format is felt to be most appropriate for this patient at this time.  The patient did not have access to video technology/had technical difficulties with video requiring transitioning to audio format only (telephone).  All issues noted in this document were discussed and addressed.  No physical exam could be performed with this format.  Please refer to the patient's chart for her  consent to telehealth for [facility].   The patient was identified using 2 identifiers.  Date:  09/12/2019   ID:  Krystal Mcpherson, DOB 1940/12/12, MRN [id number]  Patient Location: Home Provider Location: Office  PCP:  Rolene Course, PA-C  Cardiologist:  Candee Furbish, MD  Electrophysiologist:  None   Evaluation Performed:  Follow-Up Visit  Chief Complaint:  CAD tachycardia   History of Present Illness:    Krystal Mcpherson is a 79 y.o. female with CAD post remote right coronary artery stent with multiple nuclear stress test that are low risk with no perfusion defects who today states that since January has been battling with severe migraine headaches off and on.  She had an episode of migraine related vertigo where she fell in her kitchen.  She has been to the urgent care a few times where a cocktail of medications has been given with some relief.  Thankfully, she is recently seen a neurologist in [place] which has placed her back on Topamax.  This is helped a little bit.  With her migraines, she will occasionally feel some trouble breathing had rapid heart rate as well walking through the house with accompanied headache.  Heart rate  is normally in the 60s but was up as high as 100 last Sunday.  She felt as though it was hard to breathe.  She lives with her 70 year old grandson who works at the [employer].  Wore a 14 day zio patch.  This revealed 5 beat runs of SVT HR 160 no a fib.  Her atenolol increased to 25 daily.    Today she is better on the atenolol, fewer episodes, she did have one that lasted 15 min but she had forgotten her meds that morning.  She did feel weak but BP was stable.  No angina.  She has been staying in the house,  Due to [place], she has had both phizer vaccines now last one last week.   Her headaches continue followed by Neuro.  Some related to sinus infections.   The patient does not have symptoms concerning for COVID-19 infection (fever, chills, cough, or new shortness of breath).    Past Medical History:  Diagnosis Date  . Allergy   . Anemia    Hx  . Arthritis    neck, lower back  . Change in bowel habits   . Complication of anesthesia   . GERD (gastroesophageal reflux disease)   . HLD (hyperlipidemia)    hx - no med  . HTN (hypertension)   . Migraine   . PONV (postoperative nausea and vomiting)   . Stented coronary artery    right, no problems  . SVD (spontaneous vaginal delivery)    x 2  . Vitamin B12  deficiency    Past Surgical History:  Procedure Laterality Date  . bladder tact    . 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 CHOLECYSTECTOMY    . COLONOSCOPY  2009    [facility]  . cornary stent Right 05/2011  . NASAL SINUS SURGERY     x 2  . TOTAL ABDOMINAL HYSTERECTOMY W/ BILATERAL SALPINGOOPHORECTOMY       Current Meds  Medication Sig  . acetaminophen (TYLENOL) 500 MG tablet Take 1,000 mg by mouth every 6 (six) hours as needed (pain).  Marland Kitchen albuterol (PROVENTIL HFA;VENTOLIN HFA) 108 (90 BASE) MCG/ACT inhaler Inhale 2 puffs into the lungs every 4 (four) hours as needed for wheezing or shortness of breath. Reported on 10/30/2015  . aspirin EC 81 MG tablet Take  81 mg by mouth daily.  Marland Kitchen atenolol (TENORMIN) 25 MG tablet Take 1 tablet (25 mg total) by mouth daily.  . cetirizine (ZYRTEC) 10 MG tablet Take 10 mg by mouth daily.  . diphenhydrAMINE (BENADRYL) 25 mg capsule Take 25-50 mg by mouth every 6 (six) hours as needed for itching. Reported on 10/30/2015  . fluticasone (FLONASE) 50 MCG/ACT nasal spray Place 2 sprays into both nostrils as needed for allergies or rhinitis.  Marland Kitchen nitroGLYCERIN (NITROSTAT) 0.4 MG SL tablet Place 1 tablet (0.4 mg total) under the tongue every 5 (five) minutes as needed for chest pain.  Marland Kitchen olmesartan-hydrochlorothiazide (BENICAR HCT) 40-25 MG tablet Take 1 tablet by mouth daily.  Marland Kitchen topiramate (TOPAMAX) 50 MG tablet Take 50 mg by mouth daily.  . traMADol (ULTRAM) 50 MG tablet Take 50 mg by mouth every 6 (six) hours as needed (pain). Reported on 11/13/2015  . Ubrogepant (UBRELVY) 50 MG TABS Take 1 tablet by mouth as needed.  . [DISCONTINUED] nitroGLYCERIN (NITROSTAT) 0.4 MG SL tablet Place 1 tablet (0.4 mg total) under the tongue every 5 (five) minutes as needed for chest pain.     Allergies:   Metronidazole, Atenolol, Atorvastatin, Ciprofloxacin, Fentanyl, Morphine and related, Tetanus toxoids, and Augmentin [amoxicillin-pot clavulanate]   Social History   Tobacco Use  . Smoking status: Never Smoker  . Smokeless tobacco: Never Used  Substance Use Topics  . Alcohol use: No    Alcohol/week: 0.0 standard drinks  . Drug use: No     Family Hx: The patient's family history includes Arthritis in her sister; Breast cancer in her sister; Colon cancer in her paternal grandmother; Heart attack in her father and mother; Hypertension in her father, mother, sister, sister, and sister; Kidney cancer in her sister; Melanoma in her sister; Obesity in her sister; Pancreatitis in her sister; Thyroid cancer in her sister. There is no history of Esophageal cancer, Rectal cancer, or Stomach cancer.  ROS:   Please see the history of present  illness.    General:no colds or fevers, no weight changes Skin:no rashes or ulcers HEENT:no blurred vision, no congestion CV:see HPI PUL:see HPI GI:no diarrhea constipation or melena, no indigestion GU:no hematuria, no dysuria MS:no joint pain, no claudication Neuro:no syncope, no lightheadedness Endo:no diabetes, no thyroid disease  All other systems reviewed and are negative.   Prior CV studies:   The following studies were reviewed today:  NUC stress 08/05/17: Normal perfusion. LVEF 62% with normal wall motion. This is a low risk study.   Labs/Other Tests and Data Reviewed:    EKG:  An ECG dated 08/01/14 was personally reviewed today and demonstrated:  SB without ST changes.   Recent Labs: No  results found for requested labs within last 8760 hours.   Recent Lipid Panel No results found for: CHOL, TRIG, HDL, CHOLHDL, LDLCALC, LDLDIRECT  Wt Readings from Last 3 Encounters:  09/11/19 200 lb (90.7 kg)  10/20/18 204 lb (92.5 kg)  08/05/17 204 lb (92.5 kg)     Objective:    Vital Signs:  BP 127/69   Pulse 72   Ht 5' 5.5" (1.664 m)   Wt 200 lb (90.7 kg)   BMI 32.78 kg/m    VITAL SIGNS:  reviewed  General NAD Pulmonary -can speak in complete sentences without SOB Neuro A&O X 3 answers questions approp.  Psych:  Pleasant affect    ASSESSMENT & PLAN:    1. SVT now on atenolol, less freq episodes, instructed she could take an extra half tab of atenolol if needed, also discussed EP eval for SVT ablation.  She is not yet ready for that.  2. CAD with prior RCA stent no angina,   3. HLD no lipids since 2019, to see PCP in near future and have labs drawn.   4. Migraines followed by neuro   COVID-19 Education: The signs and symptoms of COVID-19 were discussed with the patient and how to seek care for testing (follow up with PCP or arrange E-visit).  The importance of social distancing was discussed today.  Time:   Today, I have spent 12 minutes with the patient with  telehealth technology discussing the above problems.     Medication Adjustments/Labs and Tests Ordered: Current medicines are reviewed at length with the patient today.  Concerns regarding medicines are outlined above.   Tests Ordered: No orders of the defined types were placed in this encounter.   Medication Changes: Meds ordered this encounter  Medications  . nitroGLYCERIN (NITROSTAT) 0.4 MG SL tablet    Sig: Place 1 tablet (0.4 mg total) under the tongue every 5 (five) minutes as needed for chest pain.    Dispense:  25 tablet    Refill:  3    Follow Up:  In Person in 6 month(s)  Signed, Cecilie Kicks, NP  09/12/2019 10:31 AM    [place]

## 2019-09-12 ENCOUNTER — Encounter: Payer: Self-pay | Admitting: Cardiology

## 2019-09-12 ENCOUNTER — Telehealth (INDEPENDENT_AMBULATORY_CARE_PROVIDER_SITE_OTHER): Payer: Medicare PPO | Admitting: Cardiology

## 2019-09-12 ENCOUNTER — Other Ambulatory Visit: Payer: Self-pay

## 2019-09-12 VITALS — BP 127/69 | HR 72 | Ht 65.5 in | Wt 200.0 lb

## 2019-09-12 DIAGNOSIS — I2510 Atherosclerotic heart disease of native coronary artery without angina pectoris: Secondary | ICD-10-CM

## 2019-09-12 DIAGNOSIS — G43911 Migraine, unspecified, intractable, with status migrainosus: Secondary | ICD-10-CM

## 2019-09-12 DIAGNOSIS — I471 Supraventricular tachycardia: Secondary | ICD-10-CM

## 2019-09-12 DIAGNOSIS — E782 Mixed hyperlipidemia: Secondary | ICD-10-CM

## 2019-09-12 MED ORDER — NITROGLYCERIN 0.4 MG SL SUBL: 0.4000 mg | SUBLINGUAL_TABLET | SUBLINGUAL | 3 refills | Status: DC | PRN

## 2019-09-12 NOTE — Patient Instructions (Signed)
Medication Instructions:  Your physician recommends that you continue on your current medications as directed. Please refer to the Current Medication list given to you today.]  *If you need a refill on your cardiac medications before your next appointment, please call your pharmacy*   Lab Work: None ordered   If you have labs (blood work) drawn today and your tests are completely normal, you will receive your results only by: Marland Kitchen MyChart Message (if you have MyChart) OR . A paper copy in the mail If you have any lab test that is abnormal or we need to change your treatment, we will call you to review the results.   Testing/Procedures: None ordered    Follow-Up: At [facility], you and your health needs are our priority.  As part of our continuing mission to provide you with exceptional heart care, we have created designated Provider Care Teams.  These Care Teams include your primary Cardiologist (physician) and Advanced Practice Providers (APPs -  Physician Assistants and Nurse Practitioners) who all work together to provide you with the care you need, when you need it.  We recommend signing up for the patient portal called "MyChart".  Sign up information is provided on this After Visit Summary.  MyChart is used to connect with patients for Virtual Visits (Telemedicine).  Patients are able to view lab/test results, encounter notes, upcoming appointments, etc.  Non-urgent messages can be sent to your provider as well.   To learn more about what you can do with MyChart, go to [email or web address].    Your next appointment:   6 month(s)  The format for your next appointment:   In Person  Provider:   You may see Candee Furbish, MD or one of the following Advanced Practice Providers on your designated Care Team:    Truitt Merle, NP  Cecilie Kicks, NP  Kathyrn Drown, NP    Other Instructions None

## 2019-10-25 ENCOUNTER — Other Ambulatory Visit: Payer: Self-pay | Admitting: Family Medicine

## 2019-10-25 ENCOUNTER — Other Ambulatory Visit (HOSPITAL_COMMUNITY): Payer: Self-pay | Admitting: Family Medicine

## 2019-10-25 DIAGNOSIS — N281 Cyst of kidney, acquired: Secondary | ICD-10-CM

## 2019-11-02 ENCOUNTER — Ambulatory Visit (HOSPITAL_COMMUNITY)
Admission: RE | Admit: 2019-11-02 | Discharge: 2019-11-02 | Disposition: A | Discharge status: Home / Self Care | Payer: Medicare PPO | Source: Ambulatory Visit | Attending: Family Medicine | Admitting: Family Medicine

## 2019-11-02 ENCOUNTER — Other Ambulatory Visit: Payer: Self-pay

## 2019-11-02 DIAGNOSIS — N281 Cyst of kidney, acquired: Secondary | ICD-10-CM | POA: Diagnosis present

## 2019-11-04 ENCOUNTER — Emergency Department (HOSPITAL_COMMUNITY): Payer: Medicare PPO

## 2019-11-04 ENCOUNTER — Encounter (HOSPITAL_COMMUNITY): Payer: Self-pay | Admitting: Emergency Medicine

## 2019-11-04 ENCOUNTER — Other Ambulatory Visit: Payer: Self-pay

## 2019-11-04 ENCOUNTER — Emergency Department (HOSPITAL_COMMUNITY)
Admission: EM | Admit: 2019-11-04 | Discharge: 2019-11-04 | Disposition: A | Discharge status: Home / Self Care | Payer: Medicare PPO | Attending: Emergency Medicine | Admitting: Emergency Medicine

## 2019-11-04 DIAGNOSIS — W28XXXA Contact with powered lawn mower, initial encounter: Secondary | ICD-10-CM | POA: Diagnosis not present

## 2019-11-04 DIAGNOSIS — W19XXXA Unspecified fall, initial encounter: Secondary | ICD-10-CM

## 2019-11-04 DIAGNOSIS — S0001XA Abrasion of scalp, initial encounter: Secondary | ICD-10-CM | POA: Diagnosis not present

## 2019-11-04 DIAGNOSIS — Z7982 Long term (current) use of aspirin: Secondary | ICD-10-CM | POA: Diagnosis not present

## 2019-11-04 DIAGNOSIS — I10 Essential (primary) hypertension: Secondary | ICD-10-CM | POA: Insufficient documentation

## 2019-11-04 DIAGNOSIS — Y92017 Garden or yard in single-family (private) house as the place of occurrence of the external cause: Secondary | ICD-10-CM | POA: Diagnosis not present

## 2019-11-04 DIAGNOSIS — Y999 Unspecified external cause status: Secondary | ICD-10-CM | POA: Insufficient documentation

## 2019-11-04 DIAGNOSIS — M542 Cervicalgia: Secondary | ICD-10-CM | POA: Insufficient documentation

## 2019-11-04 DIAGNOSIS — S8001XA Contusion of right knee, initial encounter: Secondary | ICD-10-CM | POA: Insufficient documentation

## 2019-11-04 DIAGNOSIS — S0990XA Unspecified injury of head, initial encounter: Secondary | ICD-10-CM | POA: Diagnosis present

## 2019-11-04 DIAGNOSIS — Z79899 Other long term (current) drug therapy: Secondary | ICD-10-CM | POA: Diagnosis not present

## 2019-11-04 DIAGNOSIS — Y93H2 Activity, gardening and landscaping: Secondary | ICD-10-CM | POA: Insufficient documentation

## 2019-11-04 NOTE — ED Provider Notes (Signed)
[facility] EMERGENCY DEPARTMENT Provider Note   CSN: [id number] Arrival date & time: 11/04/19  1352     History Chief Complaint  Patient presents with  . Fall    Krystal Mcpherson is a 79 y.o. female.  HPI Patient is a 79 year old female with a history of Chiari malformation s/p decompression, HLD, HTN, migraines, allergies and anemia.  Patient is presenting today with neck pain and headache after fall that occurred yesterday when she was getting off her lawnmower after trimming the grass and states that she caught her foot on the more while getting off it and fell backwards striking her head against a bicycle.  She states she did not lose consciousness however she states that she discovered that she had a small abrasion on the back of her head which bled some.  She states that she has had a headache is mild, achy, nonradiating and posterior since that time.  Patient also states she has some right leg pain and swelling but states that is very mild.  She is also concerned about her neck as she states she has some aches there as well.  She states she has been told in the past that she has degenerative disc disease/vertebra arthritis in her neck.  Patient has any nausea, vomiting, lightheadedness or dizziness, syncope or near syncope, fevers or chills.  She denies any urinary symptoms, constipation or diarrhea.     Past Medical History:  Diagnosis Date  . Allergy   . Anemia    Hx  . Arthritis    neck, lower back  . Change in bowel habits   . Complication of anesthesia   . GERD (gastroesophageal reflux disease)   . HLD (hyperlipidemia)    hx - no med  . HTN (hypertension)   . Migraine   . PONV (postoperative nausea and vomiting)   . Stented coronary artery    right, no problems  . SVD (spontaneous vaginal delivery)    x 2  . Vitamin B12 deficiency     Patient Active Problem List   Diagnosis Date Noted  . Cellulitis 08/09/2016  . Dog bite, hand, unspecified laterality,  subsequent encounter 08/09/2016  . Change in bowel habits   . Obesity 03/19/2015  . Angina decubitus ([place]) 08/01/2014  . Atherosclerosis of native coronary artery of native heart without angina pectoris 03/02/2014  . Essential hypertension, benign 03/02/2014  . Bradycardia 03/02/2014    Past Surgical History:  Procedure Laterality Date  . bladder tact    . 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 CHOLECYSTECTOMY    . COLONOSCOPY  2009    [facility]  . cornary stent Right 05/2011  . NASAL SINUS SURGERY     x 2  . TOTAL ABDOMINAL HYSTERECTOMY W/ BILATERAL SALPINGOOPHORECTOMY       OB History   No obstetric history on file.     Family History  Problem Relation Age of Onset  . Colon cancer Paternal Grandmother   . Hypertension Mother   . Heart attack Mother   . Hypertension Father   . Heart attack Father   . Arthritis Sister   . Obesity Sister   . Hypertension Sister   . Thyroid cancer Sister   . Kidney cancer Sister   . Breast cancer Sister   . Hypertension Sister   . Pancreatitis Sister   . Melanoma Sister   . Hypertension Sister   . Esophageal cancer Neg Hx   . Rectal cancer  Neg Hx   . Stomach cancer Neg Hx     Social History   Tobacco Use  . Smoking status: Never Smoker  . Smokeless tobacco: Never Used  Substance Use Topics  . Alcohol use: No    Alcohol/week: 0.0 standard drinks  . Drug use: No    Home Medications Prior to Admission medications   Medication Sig Start Date End Date Taking? Authorizing Provider  acetaminophen (TYLENOL) 500 MG tablet Take 1,000 mg by mouth every 6 (six) hours as needed (pain).    [provider]  albuterol (PROVENTIL HFA;VENTOLIN HFA) 108 (90 BASE) MCG/ACT inhaler Inhale 2 puffs into the lungs every 4 (four) hours as needed for wheezing or shortness of breath. Reported on 10/30/2015    [provider]  aspirin EC 81 MG tablet Take 81 mg by mouth daily.    [provider]  atenolol  (TENORMIN) 25 MG tablet Take 1 tablet (25 mg total) by mouth daily. 11/23/18   Jerline Pain, MD  cetirizine (ZYRTEC) 10 MG tablet Take 10 mg by mouth daily.    [provider]  diphenhydrAMINE (BENADRYL) 25 mg capsule Take 25-50 mg by mouth every 6 (six) hours as needed for itching. Reported on 10/30/2015    [provider]  fluticasone (FLONASE) 50 MCG/ACT nasal spray Place 2 sprays into both nostrils as needed for allergies or rhinitis.    [provider]  nitroGLYCERIN (NITROSTAT) 0.4 MG SL tablet Place 1 tablet (0.4 mg total) under the tongue every 5 (five) minutes as needed for chest pain. 09/12/19   Isaiah Serge, NP  olmesartan-hydrochlorothiazide (BENICAR HCT) 40-25 MG tablet Take 1 tablet by mouth daily. 07/26/17   [provider]  topiramate (TOPAMAX) 50 MG tablet Take 50 mg by mouth daily.    [provider]  traMADol (ULTRAM) 50 MG tablet Take 50 mg by mouth every 6 (six) hours as needed (pain). Reported on 11/13/2015 03/31/12   [provider]  Ubrogepant (UBRELVY) 50 MG TABS Take 1 tablet by mouth as needed.    [provider]    Allergies    Metronidazole, Atenolol, Atorvastatin, Ciprofloxacin, Fentanyl, Morphine and related, Tetanus toxoids, and Augmentin [amoxicillin-pot clavulanate]  Review of Systems   Review of Systems  Constitutional: Negative for chills and fever.  HENT: Negative for congestion.   Eyes: Negative for pain.  Respiratory: Negative for cough and shortness of breath.   Cardiovascular: Negative for chest pain and leg swelling.  Gastrointestinal: Negative for abdominal pain and vomiting.  Genitourinary: Negative for dysuria.  Musculoskeletal: Negative for myalgias.       Right leg pain and swelling, neck pain, headache  Skin: Negative for rash.  Neurological: Negative for dizziness and headaches.    Physical Exam Updated Vital Signs BP (!) 147/65 (BP Location: Right Arm)   Pulse 61   Temp (!)  97.3 F (36.3 C) (Oral)   Resp 18   Ht 5\' 6"  (1.676 m)   Wt 90.7 kg   SpO2 100%   BMI 32.28 kg/m   Physical Exam Vitals and nursing note reviewed.  Constitutional:      General: She is not in acute distress. HENT:     Head: Normocephalic and atraumatic.     Nose: Nose normal.  Eyes:     General: No scleral icterus. Cardiovascular:     Rate and Rhythm: Normal rate and regular rhythm.     Pulses: Normal pulses.     Heart sounds:  Normal heart sounds.  Pulmonary:     Effort: Pulmonary effort is normal. No respiratory distress.     Breath sounds: No wheezing.  Abdominal:     Palpations: Abdomen is soft.     Tenderness: There is no abdominal tenderness. There is no guarding or rebound.  Musculoskeletal:     Cervical back: Normal range of motion.     Right lower leg: No edema.     Left lower leg: No edema.     Comments: No bony tenderness over joints or long bones of the upper and lower extremities.  Most notably no tenderness to palpation of the right knee.  No neck or back midline tenderness, step-off, deformity, or bruising. Able to turn head left and right 45 degrees without difficulty.  Full range of motion of upper and lower extremity joints shown after palpation was conducted; with 5/5 symmetrical strength in upper and lower extremities. No chest wall tenderness, no facial or cranial tenderness.   Skin:    General: Skin is warm and dry.     Capillary Refill: Capillary refill takes less than 2 seconds.     Comments: No significant bruising  Neurological:     Mental Status: She is alert. Mental status is at baseline.     Comments: Patient has intact sensation grossly in lower and upper extremities. Intact patellar and ankle reflexes. Patient able to ambulate without difficulty.  Radial and DP pulses palpated BL.   Psychiatric:        Mood and Affect: Mood normal.        Behavior: Behavior normal.     ED Results / Procedures / Treatments   Labs (all labs ordered are  listed, but only abnormal results are displayed) Labs Reviewed - No data to display  EKG None  Radiology DG Tibia/Fibula Right  Result Date: 11/04/2019 CLINICAL DATA:  Fall, knee pain. EXAM: RIGHT TIBIA AND FIBULA - 2 VIEW COMPARISON:  None. FINDINGS: Subtle bony irregularity along the medial margin of the medial tibial plateau, likely from spurring, less likely subtle rim fracture, correlate with any joint line tenderness. Plantar and Achilles calcaneal spurs. IMPRESSION: Subtle bony irregularity along the medial margin of the medial tibial plateau, likely from spurring, less likely subtle rim fracture. Correlate with any joint line tenderness. Electronically Signed   By: Van Clines M.D.   On: 11/04/2019 15:44   CT Head Wo Contrast  Result Date: 11/04/2019 CLINICAL DATA:  Fall yesterday striking the back of the head. EXAM: CT HEAD WITHOUT CONTRAST TECHNIQUE: Contiguous axial images were obtained from the base of the skull through the vertex without intravenous contrast. COMPARISON:  03/27/2015 and MRI from 12/20/2018 FINDINGS: Brain: Chiari malformation with prior suboccipital decompression. Globus pallidus symmetric calcification as on 03/27/2015, probably physiologic. Periventricular white matter and corona radiata hypodensities favor chronic ischemic microvascular white matter disease. Otherwise, the brainstem, cerebellum, cerebral peduncles, thalamus, basal ganglia, basilar cisterns, and ventricular system appear within normal limits. No intracranial hemorrhage, mass lesion, or acute CVA. Vascular: INSERT skip cavernous Skull: Suboccipital decompression. Sinuses/Orbits: Sphenoid sinusitis, likely chronic but difficult to exclude a small acute component. Other: No supplemental non-categorized findings. IMPRESSION: 1. No acute intracranial findings. 2. Chiari malformation with prior suboccipital decompression. 3. Periventricular white matter and corona radiata hypodensities favor chronic  ischemic microvascular white matter disease. 4. Sphenoid sinusitis, likely chronic but difficult to exclude a small acute component. Electronically Signed   By: Van Clines M.D.   On: 11/04/2019 15:47   CT  Cervical Spine Wo Contrast  Result Date: 11/04/2019 CLINICAL DATA:  Fall, striking the back of the head. EXAM: CT CERVICAL SPINE WITHOUT CONTRAST TECHNIQUE: Multidetector CT imaging of the cervical spine was performed without intravenous contrast. Multiplanar CT image reconstructions were also generated. COMPARISON:  None. FINDINGS: Alignment: Straightening of the normal cervical lordosis, without significant subluxation. Skull base and vertebrae: Suboccipital decompression including the posterior arch of C1. Mild spurring at the C1-2 articulation anteriorly. No fracture or acute bony findings. Soft tissues and spinal canal: Unremarkable Disc levels: Multilevel uncinate spurring causing osseous foraminal stenosis on the right at C4-5 and C6-7; and on the left at C5-6. Suspected degenerative disc disease at the C3-4 level. Upper chest: Biapical pleuroparenchymal scarring. Other: No supplemental non-categorized findings. IMPRESSION: 1. No cervical spine fracture or acute subluxation. 2. Suboccipital decompression including the posterior arch of C1. 3. Cervical spondylosis and degenerative disc disease causing multilevel impingement. Electronically Signed   By: Van Clines M.D.   On: 11/04/2019 15:51   DG Knee Complete 4 Views Right  Result Date: 11/04/2019 CLINICAL DATA:  Fall, right knee and leg pain. EXAM: RIGHT KNEE - COMPLETE 4+ VIEW COMPARISON:  None. FINDINGS: Minimal articular margin spurring of the patella. No knee effusion. Minimal bony irregularity along the medial rim of the medial tibial plateau, probably from spurring, less likely to be a subtle rim fracture (correlate with any point tenderness along the medial joint line). IMPRESSION: 1. Minimal bony irregularity along the medial  tibial plateau, probably from spurring, less likely a subtle fracture. Correlate with any point tenderness along the medial joint line. 2. Minimal articular margin spurring of the patella. Electronically Signed   By: Van Clines M.D.   On: 11/04/2019 15:43    Procedures Procedures (including critical care time)  Medications Ordered in ED Medications - No data to display  ED Course  I have reviewed the triage vital signs and the nursing notes.  Pertinent labs & imaging results that were available during my care of the patient were reviewed by me and considered in my medical decision making (see chart for details).  Patient is a 79 year old female with past medical history pertinent for Chiari malformation s/p decompression as well as other past medical history detailed above.  Presented today for fall that occurred yesterday.  Reassuring physical exam with noted bony tenderness.  Will obtain CT head, C-spine, plain film of right knee/tib-fib to rule out fracture, dislocation, subluxation, intracranial hemorrhage.  Clinical Course as of Nov 03 1705  Sat Nov 04, 2019  1616 CT C-spine without any acute abnormalities, no fracture or dislocation of bones.  CT head with no acute findings.  There is evidence of prior decompression/Chiari malformation consistent with patient's history of such.  No acute abnormality.Plain film of tib, fibula, and and knee notable for slight bony irregularity more likely to be some form of arthritic spurring than fracture.  Radiologist recommended correlation for bony tenderness which I was unable to find any on my exam.  Patient does have some swelling in her leg however.  Low suspicion for fracture.  Will make patient aware of these results she will follow up with her PCP.   [WF]  1618 Patient made aware of all CT / Xray findings. Will follow up with PCP.    [WF]    Clinical Course User Index [WF] Tedd Sias, [place]   MDM Rules/Calculators/A&P  Patient well-appearing at time of discharge.  Will place knee and lower leg in Ace wrap to help with swelling.  Patient will use rice therapy.  Very much doubt DVT as patient's pain was rapid in onset after she fell.  I did discuss the questionable abnormality in the plain film of the right knee however patient has no bony tenderness.  I correlated my physical exam with the x-ray and there does not appear to be any obvious fracture.  I made patient aware of this abnormality and she will follow up with her primary care doctor.   Final Clinical Impression(s) / ED Diagnoses Final diagnoses:  Fall, initial encounter  Abrasion of scalp, initial encounter  Contusion of right knee, initial encounter    Rx / DC Orders ED Discharge Orders    None       Tedd Sias, [place] 11/04/19 1707    Fredia Sorrow, MD 11/05/19 [phone]

## 2019-11-04 NOTE — Discharge Instructions (Signed)
Your x-rays and CT scans are reassuring today.  We have discussed all abnormalities found on these scans.  I recommend he follow-up with your primary care doctor for reassessment and continued care.  Please take your medications as prescribed.   Please use Tylenol or ibuprofen for pain.  You may use 600 mg ibuprofen every 6 hours or 1000 mg of Tylenol every 6 hours.  You may choose to alternate between the 2.  This would be most effective.  Not to exceed 4 g of Tylenol within 24 hours.  Not to exceed 3200 mg ibuprofen 24 hours.

## 2019-11-04 NOTE — ED Triage Notes (Signed)
Patient states she fell yesterday while getting off of a lawn mower, hitting the back of her head on a bicycle. Denies LOC. States there was little bleeding from back of head. Also complaining of right lower leg pain and swelling. Patient ambulatory at triage.

## 2019-12-18 ENCOUNTER — Telehealth: Payer: Self-pay | Admitting: Cardiology

## 2019-12-18 NOTE — Telephone Encounter (Signed)
New Message  STAT if patient feels like he/she is going to faint   1) Are you dizzy now? Yes  2) Do you feel faint or have you passed out? Yes, and weak.  3) Do you have any other symptoms? Dizziness and lightheadedness   4) Have you checked your HR and BP (record if available)? 102/54 HR 54

## 2019-12-18 NOTE — Telephone Encounter (Signed)
Received call directly from operator for patient c/o dizziness, lightheadedness. Patient states she actually is not dizzy or lightheaded but head feels "fuzzy" and wants to sit instead of stand. Did not feel well yesterday, did not do much after church.Checked vitals: 109/60 mmHg, pulse 56 bpm.  Denies chest discomfort, SOB.  Takes BP medication in the morning - ate breakfast at same time.  Has been working outside more recently. Drinking at least 64 oz water and gatorade daily, more if she sweats because she sweats a lot.  Drinks 1 cup of coffee per week.  Hx colitis, suffers from constipation, denies blood in stool  Hx low b12 level. Recent [facility] screening recently showed good results for cholesterol No SVT recently but she does not want to decrease her atenolol dose because it is working well. I advised patient to decrease Benicar to 1/2 tablet (20/12.5 mg) starting tomorrow and to call her PCP for work-up. I advised her to call back if she or PCP feels that she needs an appointment with Dr. Marlou Porch. She verbalized understanding and agreement with plan and thanked me for the call.

## 2019-12-18 NOTE — Telephone Encounter (Signed)
Agree with plan. Thanks Mija Effertz, MD  

## 2019-12-19 DIAGNOSIS — E538 Deficiency of other specified B group vitamins: Secondary | ICD-10-CM | POA: Diagnosis not present

## 2019-12-19 DIAGNOSIS — R42 Dizziness and giddiness: Secondary | ICD-10-CM | POA: Diagnosis not present

## 2019-12-19 DIAGNOSIS — I10 Essential (primary) hypertension: Secondary | ICD-10-CM | POA: Diagnosis not present

## 2019-12-19 DIAGNOSIS — N183 Chronic kidney disease, stage 3 unspecified: Secondary | ICD-10-CM | POA: Diagnosis not present

## 2019-12-19 DIAGNOSIS — E559 Vitamin D deficiency, unspecified: Secondary | ICD-10-CM | POA: Diagnosis not present

## 2019-12-19 DIAGNOSIS — K760 Fatty (change of) liver, not elsewhere classified: Secondary | ICD-10-CM | POA: Diagnosis not present

## 2020-01-01 DIAGNOSIS — M542 Cervicalgia: Secondary | ICD-10-CM | POA: Diagnosis not present

## 2020-01-01 DIAGNOSIS — J309 Allergic rhinitis, unspecified: Secondary | ICD-10-CM | POA: Diagnosis not present

## 2020-01-01 DIAGNOSIS — G43711 Chronic migraine without aura, intractable, with status migrainosus: Secondary | ICD-10-CM | POA: Diagnosis not present

## 2020-01-01 DIAGNOSIS — M545 Low back pain: Secondary | ICD-10-CM | POA: Diagnosis not present

## 2020-01-10 ENCOUNTER — Other Ambulatory Visit: Payer: Self-pay | Admitting: Cardiology

## 2020-01-15 ENCOUNTER — Other Ambulatory Visit: Payer: Self-pay | Admitting: Cardiology

## 2020-01-15 DIAGNOSIS — L718 Other rosacea: Secondary | ICD-10-CM | POA: Diagnosis not present

## 2020-01-15 DIAGNOSIS — L570 Actinic keratosis: Secondary | ICD-10-CM | POA: Diagnosis not present

## 2020-02-20 DIAGNOSIS — K5792 Diverticulitis of intestine, part unspecified, without perforation or abscess without bleeding: Secondary | ICD-10-CM | POA: Diagnosis not present

## 2020-02-22 DIAGNOSIS — K5792 Diverticulitis of intestine, part unspecified, without perforation or abscess without bleeding: Secondary | ICD-10-CM | POA: Diagnosis not present

## 2020-02-26 DIAGNOSIS — L570 Actinic keratosis: Secondary | ICD-10-CM | POA: Diagnosis not present

## 2020-02-26 DIAGNOSIS — L578 Other skin changes due to chronic exposure to nonionizing radiation: Secondary | ICD-10-CM | POA: Diagnosis not present

## 2020-03-14 ENCOUNTER — Other Ambulatory Visit: Payer: Self-pay

## 2020-03-14 DIAGNOSIS — Z20822 Contact with and (suspected) exposure to covid-19: Secondary | ICD-10-CM | POA: Diagnosis not present

## 2020-03-16 LAB — SARS-COV-2, NAA 2 DAY TAT

## 2020-03-16 LAB — NOVEL CORONAVIRUS, NAA: SARS-CoV-2, NAA: NOT DETECTED

## 2020-04-17 ENCOUNTER — Other Ambulatory Visit: Payer: Self-pay | Admitting: Cardiology

## 2020-04-25 ENCOUNTER — Ambulatory Visit: Payer: Medicare PPO | Admitting: Cardiology

## 2020-04-25 NOTE — Progress Notes (Deleted)
Cardiology Office Note:    Date:  04/25/2020   ID:  Krystal Mcpherson, DOB [birth year], MRN [id number]  PCP:  Kristen Loader, FNP  [facility] Cardiologist:  Candee Furbish, MD  [place] Electrophysiologist:  None   Referring MD: Kristen Loader, FNP   No chief complaint on file. ***  History of Present Illness:    Krystal Mcpherson is a 79 y.o. female here for follow-up of dizziness lightheadedness.  Back on 12/18/2019 she called the office with symptoms of head feeling fuzzy and wanted to sit instead of stand.  Blood pressure was 109/60 with pulse of 56.  She was not having episodes of SVT because of atenolol.  She was advised to decrease her Benicar to 1/2 tablet of the 20/12.5 dose.  She also had remote right coronary artery stent, CAD.  She has battled with migraines periodically that are related to vertigo.  Her neurologist in [place] had placed her on Topamax.  This helped.  As a stated above, she likes to take the atenolol with fewer episodes of SVT, prior heart rates in the 160 range.  No atrial fibrillation.   Past Medical History:  Diagnosis Date  . Allergy   . Anemia    Hx  . Arthritis    neck, lower back  . Change in bowel habits   . Complication of anesthesia   . GERD (gastroesophageal reflux disease)   . HLD (hyperlipidemia)    hx - no med  . HTN (hypertension)   . Migraine   . PONV (postoperative nausea and vomiting)   . Stented coronary artery    right, no problems  . SVD (spontaneous vaginal delivery)    x 2  . Vitamin B12 deficiency     Past Surgical History:  Procedure Laterality Date  . bladder tact    . 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 CHOLECYSTECTOMY    . COLONOSCOPY  2009    [facility]  . cornary stent Right 05/2011  . NASAL SINUS SURGERY     x 2  . TOTAL ABDOMINAL HYSTERECTOMY W/ BILATERAL SALPINGOOPHORECTOMY      Current Medications: No outpatient medications have been marked as taking for the 04/25/20 encounter  (Appointment) with Jerline Pain, MD.     Allergies:   Metronidazole, Atenolol, Atorvastatin, Ciprofloxacin, Fentanyl, Morphine and related, Tetanus toxoids, and Augmentin [amoxicillin-pot clavulanate]   Social History   Socioeconomic History  . Marital status: Widowed    Spouse name: Not on file  . Number of children: Not on file  . Years of education: Not on file  . Highest education level: Not on file  Occupational History  . Not on file  Tobacco Use  . Smoking status: Never Smoker  . Smokeless tobacco: Never Used  Substance and Sexual Activity  . Alcohol use: No    Alcohol/week: 0.0 standard drinks  . Drug use: No  . Sexual activity: Never    Birth control/protection: Post-menopausal  Other Topics Concern  . Not on file  Social History Narrative  . Not on file   Social Determinants of Health   Financial Resource Strain:   . Difficulty of Paying Living Expenses: Not on file  Food Insecurity:   . Worried About Charity fundraiser in the Last Year: Not on file  . Ran Out of Food in the Last Year: Not on file  Transportation Needs:   . Lack of Transportation (Medical): Not on file  .  Lack of Transportation (Non-Medical): Not on file  Physical Activity:   . Days of Exercise per Week: Not on file  . Minutes of Exercise per Session: Not on file  Stress:   . Feeling of Stress : Not on file  Social Connections:   . Frequency of Communication with Friends and Family: Not on file  . Frequency of Social Gatherings with Friends and Family: Not on file  . Attends Religious Services: Not on file  . Active Member of Clubs or Organizations: Not on file  . Attends Archivist Meetings: Not on file  . Marital Status: Not on file     Family History: The patient's ***family history includes Arthritis in her sister; Breast cancer in her sister; Colon cancer in her paternal grandmother; Heart attack in her father and mother; Hypertension in her father, mother, sister,  sister, and sister; Kidney cancer in her sister; Melanoma in her sister; Obesity in her sister; Pancreatitis in her sister; Thyroid cancer in her sister. There is no history of Esophageal cancer, Rectal cancer, or Stomach cancer.  ROS:   Please see the history of present illness.    *** All other systems reviewed and are negative.  EKGs/Labs/Other Studies Reviewed:    The following studies were reviewed today:  Nuclear stress test 2019-normal  EKG:  EKG is *** ordered today.  The ekg ordered today demonstrates ***  Recent Labs: No results found for requested labs within last 8760 hours.  Recent Lipid Panel No results found for: CHOL, TRIG, HDL, CHOLHDL, VLDL, LDLCALC, LDLDIRECT   Risk Assessment/Calculations:   {Does this patient have ATRIAL FIBRILLATION?:[id number]}   Physical Exam:    VS:  There were no vitals taken for this visit.    Wt Readings from Last 3 Encounters:  11/04/19 200 lb (90.7 kg)  09/11/19 200 lb (90.7 kg)  10/20/18 204 lb (92.5 kg)     GEN: *** Well nourished, well developed in no acute distress HEENT: Normal NECK: No JVD; No carotid bruits LYMPHATICS: No lymphadenopathy CARDIAC: ***RRR, no murmurs, rubs, gallops RESPIRATORY:  Clear to auscultation without rales, wheezing or rhonchi  ABDOMEN: Soft, non-tender, non-distended MUSCULOSKELETAL:  No edema; No deformity  SKIN: Warm and dry NEUROLOGIC:  Alert and oriented x 3 PSYCHIATRIC:  Normal affect   ASSESSMENT:    1. Paroxysmal SVT (supraventricular tachycardia) ([place])   2. CAD in native artery   3. Mixed hyperlipidemia    PLAN:    In order of problems listed above:  Paroxysmal supraventricular tachycardia -Atenolol seems to be helping.  Continue.  Understands that she could take an extra atenolol or half tablet of atenolol if necessary.  If symptoms were to increase in severity, we could proceed with EP evaluation for SVT ablation.  Coronary artery disease -Prior RCA stent.  Currently  doing well without any anginal symptoms on atenolol.  Continue with exercise, secondary risk factor modification.  Hyperlipidemia -We will go ahead and order a lipid panel.  It has been quite some time.  Dizziness -Sometimes this can be associated with her migraines.  We did ask her to decrease her Benicar during prior telephone encounter.  Medication Adjustments/Labs and Tests Ordered: Current medicines are reviewed at length with the patient today.  Concerns regarding medicines are outlined above.  No orders of the defined types were placed in this encounter.  No orders of the defined types were placed in this encounter.   There are no Patient Instructions on file for this visit.  Signed, Candee Furbish, MD  04/25/2020 8:49 AM    [facility]

## 2020-04-30 DIAGNOSIS — K760 Fatty (change of) liver, not elsewhere classified: Secondary | ICD-10-CM | POA: Diagnosis not present

## 2020-04-30 DIAGNOSIS — Z1211 Encounter for screening for malignant neoplasm of colon: Secondary | ICD-10-CM | POA: Diagnosis not present

## 2020-04-30 DIAGNOSIS — T466X5A Adverse effect of antihyperlipidemic and antiarteriosclerotic drugs, initial encounter: Secondary | ICD-10-CM | POA: Diagnosis not present

## 2020-04-30 DIAGNOSIS — E559 Vitamin D deficiency, unspecified: Secondary | ICD-10-CM | POA: Diagnosis not present

## 2020-04-30 DIAGNOSIS — I10 Essential (primary) hypertension: Secondary | ICD-10-CM | POA: Diagnosis not present

## 2020-04-30 DIAGNOSIS — G8929 Other chronic pain: Secondary | ICD-10-CM | POA: Diagnosis not present

## 2020-04-30 DIAGNOSIS — M5442 Lumbago with sciatica, left side: Secondary | ICD-10-CM | POA: Diagnosis not present

## 2020-04-30 DIAGNOSIS — I2510 Atherosclerotic heart disease of native coronary artery without angina pectoris: Secondary | ICD-10-CM | POA: Diagnosis not present

## 2020-04-30 DIAGNOSIS — N183 Chronic kidney disease, stage 3 unspecified: Secondary | ICD-10-CM | POA: Diagnosis not present

## 2020-05-06 ENCOUNTER — Ambulatory Visit: Payer: Medicare PPO | Admitting: Cardiology

## 2020-05-13 DIAGNOSIS — Z1211 Encounter for screening for malignant neoplasm of colon: Secondary | ICD-10-CM | POA: Diagnosis not present

## 2020-05-24 ENCOUNTER — Ambulatory Visit: Payer: Medicare PPO | Admitting: Cardiology

## 2020-05-24 ENCOUNTER — Other Ambulatory Visit: Payer: Self-pay

## 2020-05-24 ENCOUNTER — Encounter: Payer: Self-pay | Admitting: Cardiology

## 2020-05-24 VITALS — BP 126/60 | HR 55 | Ht 66.0 in | Wt 201.0 lb

## 2020-05-24 DIAGNOSIS — I10 Essential (primary) hypertension: Secondary | ICD-10-CM | POA: Diagnosis not present

## 2020-05-24 DIAGNOSIS — I2510 Atherosclerotic heart disease of native coronary artery without angina pectoris: Secondary | ICD-10-CM

## 2020-05-24 NOTE — Patient Instructions (Signed)
Medication Instructions:  The current medical regimen is effective;  continue present plan and medications.  *If you need a refill on your cardiac medications before your next appointment, please call your pharmacy*  Follow-Up: At [facility], you and your health needs are our priority.  As part of our continuing mission to provide you with exceptional heart care, we have created designated Provider Care Teams.  These Care Teams include your primary Cardiologist (physician) and Advanced Practice Providers (APPs -  Physician Assistants and Nurse Practitioners) who all work together to provide you with the care you need, when you need it.  We recommend signing up for the patient portal called "MyChart".  Sign up information is provided on this After Visit Summary.  MyChart is used to connect with patients for Virtual Visits (Telemedicine).  Patients are able to view lab/test results, encounter notes, upcoming appointments, etc.  Non-urgent messages can be sent to your provider as well.   To learn more about what you can do with MyChart, go to [email or web address].    Your next appointment:   12 month(s)  The format for your next appointment:   In Person  Provider:   Mark Skains, MD   Thank you for choosing [facility]!!      

## 2020-05-24 NOTE — Progress Notes (Signed)
Cardiology Office Note:    Date:  05/24/2020   ID:  MERY GUADALUPE, DOB 10-18-1940, MRN [id number]  PCP:  Kristen Loader, FNP  [facility] Cardiologist:  Candee Furbish, MD  [facility] Electrophysiologist:  None   Referring MD: Kristen Loader, FNP     History of Present Illness:    Krystal Mcpherson is a 79 y.o. female here for the follow-up of CAD  Right coronary artery stent multiple nuclear stress test have been low risk.  No perfusion defects.    Past Medical History:  Diagnosis Date  . Allergy   . Anemia    Hx  . Arthritis    neck, lower back  . Change in bowel habits   . Complication of anesthesia   . GERD (gastroesophageal reflux disease)   . HLD (hyperlipidemia)    hx - no med  . HTN (hypertension)   . Migraine   . PONV (postoperative nausea and vomiting)   . Stented coronary artery    right, no problems  . SVD (spontaneous vaginal delivery)    x 2  . Vitamin B12 deficiency     Past Surgical History:  Procedure Laterality Date  . bladder tact    . 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 CHOLECYSTECTOMY    . COLONOSCOPY  2009    [facility]  . cornary stent Right 05/2011  . NASAL SINUS SURGERY     x 2  . TOTAL ABDOMINAL HYSTERECTOMY W/ BILATERAL SALPINGOOPHORECTOMY      Current Medications: Current Meds  Medication Sig  . acetaminophen (TYLENOL) 500 MG tablet Take 1,000 mg by mouth every 6 (six) hours as needed (pain).  Marland Kitchen albuterol (PROVENTIL HFA;VENTOLIN HFA) 108 (90 BASE) MCG/ACT inhaler Inhale 2 puffs into the lungs every 4 (four) hours as needed for wheezing or shortness of breath. Reported on 10/30/2015  . aspirin EC 81 MG tablet Take 81 mg by mouth daily.  Marland Kitchen atenolol (TENORMIN) 25 MG tablet TAKE ONE (1) TABLET EACH DAY  . cetirizine (ZYRTEC) 10 MG tablet Take 10 mg by mouth daily.  . diphenhydrAMINE (BENADRYL) 25 mg capsule Take 25-50 mg by mouth every 6 (six) hours as needed for itching. Reported on 10/30/2015  . fluticasone  (FLONASE) 50 MCG/ACT nasal spray Place 2 sprays into both nostrils as needed for allergies or rhinitis.  . hydrochlorothiazide (HYDRODIURIL) 25 MG tablet Take 25 mg by mouth daily.  . nitroGLYCERIN (NITROSTAT) 0.4 MG SL tablet Place 1 tablet (0.4 mg total) under the tongue every 5 (five) minutes as needed for chest pain.  Marland Kitchen olmesartan (BENICAR) 40 MG tablet Take 40 mg by mouth daily.  Marland Kitchen topiramate (TOPAMAX) 50 MG tablet Take 50 mg by mouth daily.  . traMADol (ULTRAM) 50 MG tablet Take 50 mg by mouth every 6 (six) hours as needed (pain). Reported on 11/13/2015  . Ubrogepant (UBRELVY) 50 MG TABS Take 1 tablet by mouth as needed.     Allergies:   Metronidazole, Atenolol, Atorvastatin, Ciprofloxacin, Fentanyl, Morphine and related, Tetanus toxoids, and Augmentin [amoxicillin-pot clavulanate]   Social History   Socioeconomic History  . Marital status: Widowed    Spouse name: Not on file  . Number of children: Not on file  . Years of education: Not on file  . Highest education level: Not on file  Occupational History  . Not on file  Tobacco Use  . Smoking status: Never Smoker  . Smokeless tobacco: Never Used  Substance  and Sexual Activity  . Alcohol use: No    Alcohol/week: 0.0 standard drinks  . Drug use: No  . Sexual activity: Never    Birth control/protection: Post-menopausal  Other Topics Concern  . Not on file  Social History Narrative  . Not on file   Social Determinants of Health   Financial Resource Strain:   . Difficulty of Paying Living Expenses: Not on file  Food Insecurity:   . Worried About Charity fundraiser in the Last Year: Not on file  . Ran Out of Food in the Last Year: Not on file  Transportation Needs:   . Lack of Transportation (Medical): Not on file  . Lack of Transportation (Non-Medical): Not on file  Physical Activity:   . Days of Exercise per Week: Not on file  . Minutes of Exercise per Session: Not on file  Stress:   . Feeling of Stress : Not on  file  Social Connections:   . Frequency of Communication with Friends and Family: Not on file  . Frequency of Social Gatherings with Friends and Family: Not on file  . Attends Religious Services: Not on file  . Active Member of Clubs or Organizations: Not on file  . Attends Archivist Meetings: Not on file  . Marital Status: Not on file     Family History: The patient's family history includes Arthritis in her sister; Breast cancer in her sister; Colon cancer in her paternal grandmother; Heart attack in her father and mother; Hypertension in her father, mother, sister, sister, and sister; Kidney cancer in her sister; Melanoma in her sister; Obesity in her sister; Pancreatitis in her sister; Thyroid cancer in her sister. There is no history of Esophageal cancer, Rectal cancer, or Stomach cancer.    EKG:  EKG is  ordered today.  The ekg ordered today demonstrates sinus bradycardia 55 no other changes.  Recent Labs: No results found for requested labs within last 8760 hours.  Recent Lipid Panel No results found for: CHOL, TRIG, HDL, CHOLHDL, VLDL, LDLCALC, LDLDIRECT   Physical Exam:    VS:  BP 126/60   Pulse (!) 55   Ht 5\' 6"  (1.676 m)   Wt 201 lb (91.2 kg)   SpO2 94%   BMI 32.44 kg/m     Wt Readings from Last 3 Encounters:  05/24/20 201 lb (91.2 kg)  11/04/19 200 lb (90.7 kg)  09/11/19 200 lb (90.7 kg)     GEN:  Well nourished, well developed in no acute distress HEENT: Normal NECK: No JVD; No carotid bruits LYMPHATICS: No lymphadenopathy CARDIAC: RRR, no murmurs, rubs, gallops RESPIRATORY:  Clear to auscultation without rales, wheezing or rhonchi  ABDOMEN: Soft, non-tender, non-distended MUSCULOSKELETAL:  No edema; No deformity  SKIN: Warm and dry NEUROLOGIC:  Alert and oriented x 3 PSYCHIATRIC:  Normal affect   ASSESSMENT:    1. Essential hypertension, benign   2. CAD in native artery    PLAN:    In order of problems listed above:  Prior  tachycardia palpitations -ZIO monitor 11/21/2018: Brief, less than 5 beat runs of supraventricular tachycardia, fastest at 160 bpm, appears to be paroxysmal atrial tachycardia.  No evidence of atrial fibrillation or flutter.  No significant pauses noted.  Occasionally symptoms of heart fluttering are associated with the short SVT lasting 4-5 beats.  Occasional heart fluttering or skipping could be associated with the short-lived SVT episodes. These could be conservatively with no further medication as they should be benign.  However, if desired, we can increase the atenolol from 12.5 mg once a day to 25 mg a day. This may help calm down some of these skipped beats.  Maybe one or 2 skips.   Migraine headaches -Very pleased with neurologist in [place].  On Topamax.  Coronary artery disease post right coronary stent -Does not wish to take statin medications.  Worried about potential Alzheimer's.  Tried to encourage, educate previously.  Ankle swelling --occasional. Dependent edema.  Watching salt.    Medication Adjustments/Labs and Tests Ordered: Current medicines are reviewed at length with the patient today.  Concerns regarding medicines are outlined above.  Orders Placed This Encounter  Procedures  . EKG 12-Lead   No orders of the defined types were placed in this encounter.   Patient Instructions  Medication Instructions:  The current medical regimen is effective;  continue present plan and medications.  *If you need a refill on your cardiac medications before your next appointment, please call your pharmacy*  Follow-Up: At [facility], you and your health needs are our priority.  As part of our continuing mission to provide you with exceptional heart care, we have created designated Provider Care Teams.  These Care Teams include your primary Cardiologist (physician) and Advanced Practice Providers (APPs -  Physician Assistants and Nurse Practitioners) who all work  together to provide you with the care you need, when you need it.  We recommend signing up for the patient portal called "MyChart".  Sign up information is provided on this After Visit Summary.  MyChart is used to connect with patients for Virtual Visits (Telemedicine).  Patients are able to view lab/test results, encounter notes, upcoming appointments, etc.  Non-urgent messages can be sent to your provider as well.   To learn more about what you can do with MyChart, go to [email or web address].    Your next appointment:   12 month(s)  The format for your next appointment:   In Person  Provider:   Candee Furbish, MD   Thank you for choosing [facility]!!        Signed, Candee Furbish, MD  05/24/2020 5:13 PM    [place]

## 2020-06-11 DIAGNOSIS — I83819 Varicose veins of unspecified lower extremities with pain: Secondary | ICD-10-CM | POA: Diagnosis not present

## 2020-06-11 DIAGNOSIS — M25512 Pain in left shoulder: Secondary | ICD-10-CM | POA: Diagnosis not present

## 2020-06-17 DIAGNOSIS — G43711 Chronic migraine without aura, intractable, with status migrainosus: Secondary | ICD-10-CM | POA: Diagnosis not present

## 2020-06-17 DIAGNOSIS — M542 Cervicalgia: Secondary | ICD-10-CM | POA: Diagnosis not present

## 2020-06-17 DIAGNOSIS — J309 Allergic rhinitis, unspecified: Secondary | ICD-10-CM | POA: Diagnosis not present

## 2020-06-17 DIAGNOSIS — M545 Low back pain, unspecified: Secondary | ICD-10-CM | POA: Diagnosis not present

## 2020-07-08 ENCOUNTER — Other Ambulatory Visit: Payer: Self-pay | Admitting: Cardiology

## 2020-08-14 DIAGNOSIS — M25512 Pain in left shoulder: Secondary | ICD-10-CM | POA: Diagnosis not present

## 2020-08-14 DIAGNOSIS — M79602 Pain in left arm: Secondary | ICD-10-CM | POA: Diagnosis not present

## 2020-08-23 ENCOUNTER — Encounter: Payer: Self-pay | Admitting: Gastroenterology

## 2020-08-28 DIAGNOSIS — L821 Other seborrheic keratosis: Secondary | ICD-10-CM | POA: Diagnosis not present

## 2020-08-28 DIAGNOSIS — D225 Melanocytic nevi of trunk: Secondary | ICD-10-CM | POA: Diagnosis not present

## 2020-08-28 DIAGNOSIS — L578 Other skin changes due to chronic exposure to nonionizing radiation: Secondary | ICD-10-CM | POA: Diagnosis not present

## 2020-08-28 DIAGNOSIS — L718 Other rosacea: Secondary | ICD-10-CM | POA: Diagnosis not present

## 2020-08-28 DIAGNOSIS — L814 Other melanin hyperpigmentation: Secondary | ICD-10-CM | POA: Diagnosis not present

## 2020-09-25 DIAGNOSIS — L718 Other rosacea: Secondary | ICD-10-CM | POA: Diagnosis not present

## 2020-11-01 ENCOUNTER — Ambulatory Visit (HOSPITAL_COMMUNITY)
Admission: RE | Admit: 2020-11-01 | Discharge: 2020-11-01 | Disposition: A | Discharge status: Home / Self Care | Payer: Medicare PPO | Source: Ambulatory Visit | Attending: Cardiology | Admitting: Cardiology

## 2020-11-01 ENCOUNTER — Other Ambulatory Visit (HOSPITAL_COMMUNITY): Payer: Self-pay | Admitting: Family Medicine

## 2020-11-01 ENCOUNTER — Other Ambulatory Visit: Payer: Self-pay

## 2020-11-01 DIAGNOSIS — I10 Essential (primary) hypertension: Secondary | ICD-10-CM | POA: Diagnosis not present

## 2020-11-01 DIAGNOSIS — N183 Chronic kidney disease, stage 3 unspecified: Secondary | ICD-10-CM | POA: Diagnosis not present

## 2020-11-01 DIAGNOSIS — M25475 Effusion, left foot: Secondary | ICD-10-CM | POA: Diagnosis not present

## 2020-11-01 DIAGNOSIS — M25474 Effusion, right foot: Secondary | ICD-10-CM

## 2020-11-01 DIAGNOSIS — R600 Localized edema: Secondary | ICD-10-CM

## 2020-11-01 DIAGNOSIS — Z0000 Encounter for general adult medical examination without abnormal findings: Secondary | ICD-10-CM | POA: Diagnosis not present

## 2020-11-01 DIAGNOSIS — M25471 Effusion, right ankle: Secondary | ICD-10-CM

## 2020-11-01 DIAGNOSIS — K760 Fatty (change of) liver, not elsewhere classified: Secondary | ICD-10-CM | POA: Diagnosis not present

## 2020-11-01 DIAGNOSIS — T466X5A Adverse effect of antihyperlipidemic and antiarteriosclerotic drugs, initial encounter: Secondary | ICD-10-CM | POA: Diagnosis not present

## 2020-11-01 DIAGNOSIS — J4520 Mild intermittent asthma, uncomplicated: Secondary | ICD-10-CM | POA: Diagnosis not present

## 2020-11-01 DIAGNOSIS — E785 Hyperlipidemia, unspecified: Secondary | ICD-10-CM | POA: Diagnosis not present

## 2020-11-01 DIAGNOSIS — E559 Vitamin D deficiency, unspecified: Secondary | ICD-10-CM | POA: Diagnosis not present

## 2020-11-01 DIAGNOSIS — M25472 Effusion, left ankle: Secondary | ICD-10-CM | POA: Insufficient documentation

## 2020-11-01 DIAGNOSIS — I2510 Atherosclerotic heart disease of native coronary artery without angina pectoris: Secondary | ICD-10-CM | POA: Diagnosis not present

## 2020-11-07 ENCOUNTER — Telehealth: Payer: Self-pay | Admitting: Gastroenterology

## 2020-11-07 NOTE — Telephone Encounter (Signed)
Dr. Jillyn Ledger called to advise he just started the patient on Eliquis for DBT and recommended we postpone the colonoscopy.

## 2020-11-07 NOTE — Telephone Encounter (Signed)
Noted. New colon recall for 1 year placed in epic.

## 2020-11-22 ENCOUNTER — Encounter: Payer: Medicare PPO | Admitting: Gastroenterology

## 2020-11-26 ENCOUNTER — Telehealth: Payer: Self-pay | Admitting: Cardiology

## 2020-11-26 NOTE — Telephone Encounter (Signed)
Spoke with pt who reports she has had a migraine since Friday.  She has taken Ubrelvy several times now (rxed by PCP). The headache has eased up but is lingering and her neck hurts as well.  She reports this is not abnormal for her when she has a migraine.  On 11/01/2020 she was diagnosed with a blood clot in her right leg.  Bilaterally venous doppler was ordered by her PCP and testing was completed at our [facility] office (result in [place]).  She reports being started on Eliquis 10 mg for 2 weeks then to decrease to 5 mg BID.  She has been taking this though it is not on her medication list.  Last night she reports having numbness and pain in her left arm extending down into her hand.  She reports she went to bed because she just felt so bad.  She had some trouble breathing when laying flat and had to prop herself up, which did seem to help.  Her arm hurt more if it was laying down on the bed beside her body so she held it up against her body with her right arm.  She denies having had any chest pain with any of this.  She also denies any weakness or pain  anywhere else in her body.  She denies any recent heavy lifting, exercising or activity that may have caused muscle pain.  Today, she denies any pain, maybe just some tingling in her hand.  Last night appt 9 pm BP was 120/70 range.  At 11 pm 179/80, her HR has been in the 50-60 range the entire time.  She reports her biggest concern is than the arm discomfort reminds her of what she felt before having had her stent 10 years ago.  Patient states her has made the decision not to be evaluated in the ED (last night) and doesn't feel she needs to go today.  I scheduled her for eval with Dr Marlou Porch 5/31.  Pt is aware if s/s return she will need to call 911 to be taken to the closest ED for evaluation and treatment.  She will call back if any further questions/concerns prior to her appt.  Will forward to Dr Marlou Porch for his knowledge.

## 2020-11-26 NOTE — Telephone Encounter (Signed)
PT is calling with a lot of concerns about her health.She is requesting a call back to discuss with the nurse what she should do

## 2020-11-28 ENCOUNTER — Other Ambulatory Visit: Payer: Self-pay

## 2020-11-28 ENCOUNTER — Observation Stay (HOSPITAL_COMMUNITY): Payer: Medicare PPO

## 2020-11-28 ENCOUNTER — Encounter (HOSPITAL_BASED_OUTPATIENT_CLINIC_OR_DEPARTMENT_OTHER): Payer: Self-pay | Admitting: Emergency Medicine

## 2020-11-28 ENCOUNTER — Inpatient Hospital Stay (HOSPITAL_BASED_OUTPATIENT_CLINIC_OR_DEPARTMENT_OTHER)
Admission: EM | Admit: 2020-11-28 | Discharge: 2020-12-11 | DRG: 234 | Disposition: A | Discharge status: Home / Self Care | Payer: Medicare PPO | Attending: Cardiothoracic Surgery | Admitting: Cardiothoracic Surgery

## 2020-11-28 ENCOUNTER — Encounter (HOSPITAL_COMMUNITY)
Admission: EM | Disposition: A | Discharge status: Home / Self Care | Payer: Self-pay | Source: Home / Self Care | Attending: Cardiothoracic Surgery

## 2020-11-28 DIAGNOSIS — E782 Mixed hyperlipidemia: Secondary | ICD-10-CM | POA: Diagnosis not present

## 2020-11-28 DIAGNOSIS — G43909 Migraine, unspecified, not intractable, without status migrainosus: Secondary | ICD-10-CM | POA: Diagnosis present

## 2020-11-28 DIAGNOSIS — Z01818 Encounter for other preprocedural examination: Secondary | ICD-10-CM | POA: Diagnosis not present

## 2020-11-28 DIAGNOSIS — I2510 Atherosclerotic heart disease of native coronary artery without angina pectoris: Secondary | ICD-10-CM | POA: Diagnosis present

## 2020-11-28 DIAGNOSIS — J90 Pleural effusion, not elsewhere classified: Secondary | ICD-10-CM | POA: Diagnosis not present

## 2020-11-28 DIAGNOSIS — I82441 Acute embolism and thrombosis of right tibial vein: Secondary | ICD-10-CM | POA: Diagnosis present

## 2020-11-28 DIAGNOSIS — Z79899 Other long term (current) drug therapy: Secondary | ICD-10-CM

## 2020-11-28 DIAGNOSIS — I129 Hypertensive chronic kidney disease with stage 1 through stage 4 chronic kidney disease, or unspecified chronic kidney disease: Secondary | ICD-10-CM | POA: Diagnosis not present

## 2020-11-28 DIAGNOSIS — K5900 Constipation, unspecified: Secondary | ICD-10-CM | POA: Diagnosis present

## 2020-11-28 DIAGNOSIS — I081 Rheumatic disorders of both mitral and tricuspid valves: Secondary | ICD-10-CM | POA: Diagnosis not present

## 2020-11-28 DIAGNOSIS — I471 Supraventricular tachycardia: Secondary | ICD-10-CM | POA: Diagnosis not present

## 2020-11-28 DIAGNOSIS — I214 Non-ST elevation (NSTEMI) myocardial infarction: Secondary | ICD-10-CM

## 2020-11-28 DIAGNOSIS — Z951 Presence of aortocoronary bypass graft: Secondary | ICD-10-CM | POA: Diagnosis not present

## 2020-11-28 DIAGNOSIS — Z20822 Contact with and (suspected) exposure to covid-19: Secondary | ICD-10-CM | POA: Diagnosis present

## 2020-11-28 DIAGNOSIS — Z881 Allergy status to other antibiotic agents status: Secondary | ICD-10-CM

## 2020-11-28 DIAGNOSIS — K449 Diaphragmatic hernia without obstruction or gangrene: Secondary | ICD-10-CM | POA: Diagnosis not present

## 2020-11-28 DIAGNOSIS — N189 Chronic kidney disease, unspecified: Secondary | ICD-10-CM | POA: Diagnosis not present

## 2020-11-28 DIAGNOSIS — R079 Chest pain, unspecified: Secondary | ICD-10-CM

## 2020-11-28 DIAGNOSIS — K219 Gastro-esophageal reflux disease without esophagitis: Secondary | ICD-10-CM | POA: Diagnosis present

## 2020-11-28 DIAGNOSIS — Z955 Presence of coronary angioplasty implant and graft: Secondary | ICD-10-CM

## 2020-11-28 DIAGNOSIS — I825Y1 Chronic embolism and thrombosis of unspecified deep veins of right proximal lower extremity: Secondary | ICD-10-CM | POA: Diagnosis not present

## 2020-11-28 DIAGNOSIS — Z7982 Long term (current) use of aspirin: Secondary | ICD-10-CM | POA: Diagnosis not present

## 2020-11-28 DIAGNOSIS — M47814 Spondylosis without myelopathy or radiculopathy, thoracic region: Secondary | ICD-10-CM | POA: Diagnosis not present

## 2020-11-28 DIAGNOSIS — Z90710 Acquired absence of both cervix and uterus: Secondary | ICD-10-CM | POA: Diagnosis not present

## 2020-11-28 DIAGNOSIS — M1990 Unspecified osteoarthritis, unspecified site: Secondary | ICD-10-CM | POA: Diagnosis present

## 2020-11-28 DIAGNOSIS — Z8261 Family history of arthritis: Secondary | ICD-10-CM

## 2020-11-28 DIAGNOSIS — Z888 Allergy status to other drugs, medicaments and biological substances status: Secondary | ICD-10-CM

## 2020-11-28 DIAGNOSIS — I7300 Raynaud's syndrome without gangrene: Secondary | ICD-10-CM | POA: Diagnosis present

## 2020-11-28 DIAGNOSIS — J9811 Atelectasis: Secondary | ICD-10-CM | POA: Diagnosis not present

## 2020-11-28 DIAGNOSIS — D62 Acute posthemorrhagic anemia: Secondary | ICD-10-CM | POA: Diagnosis not present

## 2020-11-28 DIAGNOSIS — Z8249 Family history of ischemic heart disease and other diseases of the circulatory system: Secondary | ICD-10-CM | POA: Diagnosis not present

## 2020-11-28 DIAGNOSIS — Z885 Allergy status to narcotic agent status: Secondary | ICD-10-CM

## 2020-11-28 DIAGNOSIS — Z01810 Encounter for preprocedural cardiovascular examination: Secondary | ICD-10-CM | POA: Diagnosis not present

## 2020-11-28 DIAGNOSIS — Z9049 Acquired absence of other specified parts of digestive tract: Secondary | ICD-10-CM | POA: Diagnosis not present

## 2020-11-28 DIAGNOSIS — E7800 Pure hypercholesterolemia, unspecified: Secondary | ICD-10-CM | POA: Diagnosis not present

## 2020-11-28 DIAGNOSIS — Z887 Allergy status to serum and vaccine status: Secondary | ICD-10-CM

## 2020-11-28 DIAGNOSIS — I872 Venous insufficiency (chronic) (peripheral): Secondary | ICD-10-CM | POA: Diagnosis not present

## 2020-11-28 DIAGNOSIS — L719 Rosacea, unspecified: Secondary | ICD-10-CM | POA: Diagnosis present

## 2020-11-28 DIAGNOSIS — I480 Paroxysmal atrial fibrillation: Secondary | ICD-10-CM | POA: Diagnosis not present

## 2020-11-28 DIAGNOSIS — I517 Cardiomegaly: Secondary | ICD-10-CM | POA: Diagnosis not present

## 2020-11-28 DIAGNOSIS — E785 Hyperlipidemia, unspecified: Secondary | ICD-10-CM | POA: Diagnosis present

## 2020-11-28 DIAGNOSIS — D631 Anemia in chronic kidney disease: Secondary | ICD-10-CM | POA: Diagnosis not present

## 2020-11-28 DIAGNOSIS — R600 Localized edema: Secondary | ICD-10-CM | POA: Diagnosis present

## 2020-11-28 DIAGNOSIS — E8770 Fluid overload, unspecified: Secondary | ICD-10-CM | POA: Diagnosis not present

## 2020-11-28 DIAGNOSIS — R112 Nausea with vomiting, unspecified: Secondary | ICD-10-CM | POA: Diagnosis not present

## 2020-11-28 DIAGNOSIS — Z9889 Other specified postprocedural states: Secondary | ICD-10-CM

## 2020-11-28 DIAGNOSIS — I493 Ventricular premature depolarization: Secondary | ICD-10-CM | POA: Diagnosis not present

## 2020-11-28 DIAGNOSIS — I82431 Acute embolism and thrombosis of right popliteal vein: Secondary | ICD-10-CM | POA: Diagnosis present

## 2020-11-28 DIAGNOSIS — I25110 Atherosclerotic heart disease of native coronary artery with unstable angina pectoris: Secondary | ICD-10-CM | POA: Diagnosis not present

## 2020-11-28 DIAGNOSIS — I10 Essential (primary) hypertension: Secondary | ICD-10-CM | POA: Diagnosis present

## 2020-11-28 DIAGNOSIS — Z86718 Personal history of other venous thrombosis and embolism: Secondary | ICD-10-CM | POA: Diagnosis not present

## 2020-11-28 DIAGNOSIS — J939 Pneumothorax, unspecified: Secondary | ICD-10-CM

## 2020-11-28 HISTORY — DX: Atherosclerotic heart disease of native coronary artery without angina pectoris: I25.10

## 2020-11-28 HISTORY — DX: Dyspnea, unspecified: R06.00

## 2020-11-28 HISTORY — DX: Unspecified asthma, uncomplicated: J45.909

## 2020-11-28 HISTORY — DX: Pneumonia, unspecified organism: J18.9

## 2020-11-28 HISTORY — DX: Acute myocardial infarction, unspecified: I21.9

## 2020-11-28 HISTORY — DX: Chronic kidney disease, unspecified: N18.9

## 2020-11-28 HISTORY — PX: LEFT HEART CATH AND CORONARY ANGIOGRAPHY: CATH118249

## 2020-11-28 HISTORY — DX: Angina pectoris, unspecified: I20.9

## 2020-11-28 LAB — ECHOCARDIOGRAM COMPLETE
Area-P 1/2: 2.14 cm2
Height: 66 in
S' Lateral: 3.1 cm
Weight: 3120 oz

## 2020-11-28 LAB — CBC WITH DIFFERENTIAL/PLATELET
Abs Immature Granulocytes: 0.05 10*3/uL (ref 0.00–0.07)
Basophils Absolute: 0.1 10*3/uL (ref 0.0–0.1)
Basophils Relative: 1 %
Eosinophils Absolute: 0.2 10*3/uL (ref 0.0–0.5)
Eosinophils Relative: 3 %
HCT: 39.5 % (ref 36.0–46.0)
Hemoglobin: 12.9 g/dL (ref 12.0–15.0)
Immature Granulocytes: 1 %
Lymphocytes Relative: 31 %
Lymphs Abs: 2.2 10*3/uL (ref 0.7–4.0)
MCH: 27.7 pg (ref 26.0–34.0)
MCHC: 32.7 g/dL (ref 30.0–36.0)
MCV: 84.8 fL (ref 80.0–100.0)
Monocytes Absolute: 0.7 10*3/uL (ref 0.1–1.0)
Monocytes Relative: 9 %
Neutro Abs: 3.8 10*3/uL (ref 1.7–7.7)
Neutrophils Relative %: 55 %
Platelets: 227 10*3/uL (ref 150–400)
RBC: 4.66 MIL/uL (ref 3.87–5.11)
RDW: 13.2 % (ref 11.5–15.5)
WBC: 6.9 10*3/uL (ref 4.0–10.5)
nRBC: 0 % (ref 0.0–0.2)

## 2020-11-28 LAB — HEPARIN LEVEL (UNFRACTIONATED): Heparin Unfractionated: >1.1 IU/mL — ABNORMAL HIGH (ref 0.30–0.70)

## 2020-11-28 LAB — BASIC METABOLIC PANEL
Anion gap: 10 (ref 5–15)
BUN: 22 mg/dL (ref 8–23)
CO2: 27 mmol/L (ref 22–32)
Calcium: 9.7 mg/dL (ref 8.9–10.3)
Chloride: 99 mmol/L (ref 98–111)
Creatinine, Ser: 1.16 mg/dL — ABNORMAL HIGH (ref 0.44–1.00)
GFR, Estimated: 49 mL/min — ABNORMAL LOW (ref >60)
Glucose, Bld: 107 mg/dL — ABNORMAL HIGH (ref 70–99)
Potassium: 3.4 mmol/L — ABNORMAL LOW (ref 3.5–5.1)
Sodium: 136 mmol/L (ref 135–145)

## 2020-11-28 LAB — RESP PANEL BY RT-PCR (FLU A&B, COVID) ARPGX2
Influenza A by PCR: NEGATIVE
Influenza B by PCR: NEGATIVE
SARS Coronavirus 2 by RT PCR: NEGATIVE

## 2020-11-28 LAB — TROPONIN I (HIGH SENSITIVITY)
Troponin I (High Sensitivity): 1622 ng/L (ref <18)
Troponin I (High Sensitivity): 3242 ng/L (ref <18)

## 2020-11-28 LAB — D-DIMER, QUANTITATIVE: D-Dimer, Quant: 0.35 ug/mL-FEU (ref 0.00–0.50)

## 2020-11-28 LAB — APTT: aPTT: 31 seconds (ref 24–36)

## 2020-11-28 SURGERY — LEFT HEART CATH AND CORONARY ANGIOGRAPHY
Anesthesia: LOCAL

## 2020-11-28 MED ORDER — SODIUM CHLORIDE 0.9 % WEIGHT BASED INFUSION: 3.0000 mL/kg/h | INTRAVENOUS | Status: DC

## 2020-11-28 MED ORDER — ACETAMINOPHEN 325 MG PO TABS: 650.0000 mg | ORAL_TABLET | ORAL | Status: DC | PRN

## 2020-11-28 MED ORDER — NITROGLYCERIN 0.4 MG SL SUBL
0.4000 mg | SUBLINGUAL_TABLET | SUBLINGUAL | Status: DC | PRN
  Administered 2020-12-01: 0.4 mg via SUBLINGUAL
  Filled 2020-11-28: qty 1

## 2020-11-28 MED ORDER — HEPARIN (PORCINE) 25000 UT/250ML-% IV SOLN
1300.0000 [IU]/h | INTRAVENOUS | Status: DC
  Administered 2020-11-28 – 2020-12-04 (×7): 1300 [IU]/h via INTRAVENOUS
  Filled 2020-11-28 (×7): qty 250

## 2020-11-28 MED ORDER — ASPIRIN 81 MG PO CHEW
81.0000 mg | CHEWABLE_TABLET | ORAL | Status: DC
Start: 2020-11-29 — End: 2020-11-28

## 2020-11-28 MED ORDER — ASPIRIN 81 MG PO CHEW
324.0000 mg | CHEWABLE_TABLET | Freq: Once | ORAL | Status: AC
  Administered 2020-11-28: 324 mg via ORAL
  Filled 2020-11-28: qty 4

## 2020-11-28 MED ORDER — MIDAZOLAM HCL 2 MG/2ML IJ SOLN
INTRAMUSCULAR | Status: AC
  Filled 2020-11-28: qty 2

## 2020-11-28 MED ORDER — HEPARIN (PORCINE) 25000 UT/250ML-% IV SOLN
1200.0000 [IU]/h | INTRAVENOUS | Status: DC
  Administered 2020-11-28: 1200 [IU]/h via INTRAVENOUS
  Filled 2020-11-28: qty 250

## 2020-11-28 MED ORDER — LORAZEPAM 2 MG/ML IJ SOLN
0.2500 mg | Freq: Once | INTRAMUSCULAR | Status: AC
  Administered 2020-11-28: 0.25 mg via INTRAVENOUS
  Filled 2020-11-28: qty 1

## 2020-11-28 MED ORDER — SODIUM CHLORIDE 0.9% FLUSH: 3.0000 mL | INTRAVENOUS | Status: DC | PRN

## 2020-11-28 MED ORDER — LIDOCAINE HCL (PF) 1 % IJ SOLN
INTRAMUSCULAR | Status: DC | PRN
Start: 2020-11-28 — End: 2020-11-28
  Administered 2020-11-28: 2 mL

## 2020-11-28 MED ORDER — IOHEXOL 350 MG/ML SOLN
INTRAVENOUS | Status: DC | PRN
  Administered 2020-11-28: 65 mL

## 2020-11-28 MED ORDER — MIDAZOLAM HCL 2 MG/2ML IJ SOLN
INTRAMUSCULAR | Status: DC | PRN
  Administered 2020-11-28: 1 mg via INTRAVENOUS

## 2020-11-28 MED ORDER — HEPARIN SODIUM (PORCINE) 1000 UNIT/ML IJ SOLN
INTRAMUSCULAR | Status: AC
  Filled 2020-11-28: qty 1

## 2020-11-28 MED ORDER — SODIUM CHLORIDE 0.9 % WEIGHT BASED INFUSION
1.0000 mL/kg/h | INTRAVENOUS | Status: AC
  Administered 2020-11-28: 1 mL/kg/h via INTRAVENOUS

## 2020-11-28 MED ORDER — HEPARIN SODIUM (PORCINE) 1000 UNIT/ML IJ SOLN
INTRAMUSCULAR | Status: DC | PRN
  Administered 2020-11-28: 4500 [IU] via INTRAVENOUS

## 2020-11-28 MED ORDER — ASPIRIN EC 81 MG PO TBEC
81.0000 mg | DELAYED_RELEASE_TABLET | Freq: Every day | ORAL | Status: DC
Start: 2020-11-29 — End: 2020-12-04
  Administered 2020-11-29 – 2020-12-03 (×5): 81 mg via ORAL
  Filled 2020-11-28 (×5): qty 1

## 2020-11-28 MED ORDER — LORATADINE 10 MG PO TABS
10.0000 mg | ORAL_TABLET | Freq: Every day | ORAL | Status: DC
  Administered 2020-11-28 – 2020-12-11 (×13): 10 mg via ORAL
  Filled 2020-11-28 (×13): qty 1

## 2020-11-28 MED ORDER — ALBUTEROL SULFATE HFA 108 (90 BASE) MCG/ACT IN AERS: 2.0000 | INHALATION_SPRAY | RESPIRATORY_TRACT | Status: DC | PRN

## 2020-11-28 MED ORDER — FENTANYL CITRATE (PF) 100 MCG/2ML IJ SOLN
INTRAMUSCULAR | Status: AC
  Filled 2020-11-28: qty 2

## 2020-11-28 MED ORDER — TOPIRAMATE 25 MG PO TABS
50.0000 mg | ORAL_TABLET | Freq: Every day | ORAL | Status: DC
  Filled 2020-11-28: qty 2

## 2020-11-28 MED ORDER — LIDOCAINE HCL (PF) 1 % IJ SOLN
INTRAMUSCULAR | Status: AC
  Filled 2020-11-28: qty 30

## 2020-11-28 MED ORDER — ONDANSETRON HCL 4 MG/2ML IJ SOLN
4.0000 mg | Freq: Four times a day (QID) | INTRAMUSCULAR | Status: DC | PRN
  Administered 2020-11-29: 4 mg via INTRAVENOUS
  Filled 2020-11-28: qty 2

## 2020-11-28 MED ORDER — HEPARIN (PORCINE) IN NACL 1000-0.9 UT/500ML-% IV SOLN
INTRAVENOUS | Status: AC
  Filled 2020-11-28: qty 1000

## 2020-11-28 MED ORDER — ONDANSETRON HCL 4 MG/2ML IJ SOLN: 4.0000 mg | Freq: Four times a day (QID) | INTRAMUSCULAR | Status: DC | PRN

## 2020-11-28 MED ORDER — SODIUM CHLORIDE 0.9 % WEIGHT BASED INFUSION: 1.0000 mL/kg/h | INTRAVENOUS | Status: DC

## 2020-11-28 MED ORDER — VERAPAMIL HCL 2.5 MG/ML IV SOLN
INTRAVENOUS | Status: AC
  Filled 2020-11-28: qty 2

## 2020-11-28 MED ORDER — TRAMADOL HCL 50 MG PO TABS: 50.0000 mg | ORAL_TABLET | Freq: Four times a day (QID) | ORAL | Status: DC | PRN

## 2020-11-28 MED ORDER — TOPIRAMATE 25 MG PO TABS
50.0000 mg | ORAL_TABLET | Freq: Every day | ORAL | Status: DC
  Administered 2020-11-28 – 2020-12-10 (×13): 50 mg via ORAL
  Filled 2020-11-28 (×15): qty 2

## 2020-11-28 MED ORDER — DOXYCYCLINE HYCLATE 50 MG PO CAPS
50.0000 mg | ORAL_CAPSULE | Freq: Every day | ORAL | Status: DC
  Administered 2020-11-28: 50 mg via ORAL
  Filled 2020-11-28 (×3): qty 1

## 2020-11-28 MED ORDER — SODIUM CHLORIDE 0.9 % IV SOLN: 250.0000 mL | INTRAVENOUS | Status: DC | PRN

## 2020-11-28 MED ORDER — ACETAMINOPHEN 325 MG PO TABS
650.0000 mg | ORAL_TABLET | ORAL | Status: DC | PRN
  Administered 2020-11-28 – 2020-11-29 (×2): 650 mg via ORAL
  Filled 2020-11-28 (×2): qty 2

## 2020-11-28 MED ORDER — HEPARIN (PORCINE) IN NACL 1000-0.9 UT/500ML-% IV SOLN
INTRAVENOUS | Status: DC | PRN
  Administered 2020-11-28 (×2): 500 mL

## 2020-11-28 MED ORDER — VERAPAMIL HCL 2.5 MG/ML IV SOLN
INTRAVENOUS | Status: DC | PRN
  Administered 2020-11-28: 10 mL via INTRA_ARTERIAL

## 2020-11-28 MED ORDER — NITROGLYCERIN IN D5W 200-5 MCG/ML-% IV SOLN
0.0000 ug/min | INTRAVENOUS | Status: DC
  Administered 2020-11-28: 5 ug/min via INTRAVENOUS
  Administered 2020-11-29: 10 ug/min via INTRAVENOUS
  Filled 2020-11-28 (×2): qty 250

## 2020-11-28 MED ORDER — SODIUM CHLORIDE 0.9% FLUSH
3.0000 mL | Freq: Two times a day (BID) | INTRAVENOUS | Status: DC
  Administered 2020-11-29 – 2020-12-02 (×8): 3 mL via INTRAVENOUS

## 2020-11-28 SURGICAL SUPPLY — 9 items

## 2020-11-28 NOTE — ED Notes (Signed)
VSS constant CP at 1-2 that intermittently increases to 3-4 and radiates to back and left arm. NTG titrated to 54mcg/min SB with frequent unifocal PVCs on monitor  Resp. Even and unlabored; skin w&d Nausea persists; no vomiting

## 2020-11-28 NOTE — ED Notes (Addendum)
Mild nausea.  Had stent to RCA 10 years ago with beginnings of small bypasses developing at that time.   Pain still at a 4/10 NTG bumped to 84mcg/min  BP stable  SB with frequent PVCs.  Supplemental O2 at 2lpm/[place]

## 2020-11-28 NOTE — Interval H&P Note (Signed)
History and Physical Interval Note:  11/28/2020 2:24 PM  Krystal Mcpherson  has presented today for surgery, with the diagnosis of chest pain.  The various methods of treatment have been discussed with the patient and family. After consideration of risks, benefits and other options for treatment, the patient has consented to  Procedure(s): LEFT HEART CATH AND CORONARY ANGIOGRAPHY (N/A) as a surgical intervention.  The patient's history has been reviewed, patient examined, no change in status, stable for surgery.  I have reviewed the patient's chart and labs.  Questions were answered to the patient's satisfaction.   Cath Lab Visit (complete for each Cath Lab visit)  Clinical Evaluation Leading to the Procedure:   ACS: Yes.    Non-ACS:    Anginal Classification: CCS IV  Anti-ischemic medical therapy: Maximal Therapy (2 or more classes of medications)  Non-Invasive Test Results: No non-invasive testing performed  Prior CABG: No previous CABG        Krystal Mcpherson [facility] 11/28/2020 2:24 PM'

## 2020-11-28 NOTE — Progress Notes (Signed)
  Echocardiogram 2D Echocardiogram has been performed.  Krystal Mcpherson 11/28/2020, 4:44 PM

## 2020-11-28 NOTE — Progress Notes (Signed)
ANTICOAGULATION CONSULT NOTE - Initial Consult  Pharmacy Consult for heparin Indication: chest pain/ACS and DVT  Allergies  Allergen Reactions  . Metronidazole Hives  . Atenolol     Increased dosages cause bradycardia.  Patient can take atenolol 25 mg with no problems.    . Atorvastatin     Muscle pain   . Ciprofloxacin Hives  . Fentanyl     Itching all over   . Morphine And Related     Hallucinations   . Tetanus Toxoids     Localized knot   . Augmentin [Amoxicillin-Pot Clavulanate] Nausea And Vomiting    Pt has taken Keflex (cephalexin) without adverse reaction. Started 08/06/16    Patient Measurements: Height: 5\' 6"  (167.6 cm) Weight: 88.5 kg (195 lb) IBW/kg (Calculated) : 59.3 Heparin Dosing Weight: 80kg  Vital Signs: Temp: 98.6 F (37 C) (05/26 0520) Temp Source: Oral (05/26 0520) BP: 172/59 (05/26 0600) Pulse Rate: 56 (05/26 0600)  Labs: Recent Labs    11/28/20 0613  HGB 12.9  HCT 39.5  PLT 227  CREATININE 1.16*  TROPONINIHS 1,622*    Estimated Creatinine Clearance: 45.5 mL/min (A) (by C-G formula based on SCr of 1.16 mg/dL (H)).   Medical History: Past Medical History:  Diagnosis Date  . Allergy   . Anemia    Hx  . Arthritis    neck, lower back  . Change in bowel habits   . Complication of anesthesia   . GERD (gastroesophageal reflux disease)   . HLD (hyperlipidemia)    hx - no med  . HTN (hypertension)   . Migraine   . PONV (postoperative nausea and vomiting)   . Stented coronary artery    right, no problems  . SVD (spontaneous vaginal delivery)    x 2  . Vitamin B12 deficiency     Assessment: 80yo female c/o midsternal chest pain radiating to left shoulder and arm associated w/ nausea, worsens when lying down, initial troponin elevated, to begin heparin.  Of note pt started on Eliquis 63mo ago for DVT, last dose taken 5/25 9p.  Goal of Therapy:  Heparin level 0.3-0.7 units/ml aPTT 66-102 seconds Monitor platelets by  anticoagulation protocol: Yes   Plan:  At [place] will start heparin infusion at 1200 units/hr and monitor heparin levels, aPTT (while Eliquis affects anti-Xa), and CBC.  Wynona Neat, PharmD, BCPS  11/28/2020,6:53 AM

## 2020-11-28 NOTE — ED Notes (Signed)
Report given to Shanda Bumps, RN of Cath Lab

## 2020-11-28 NOTE — ED Provider Notes (Addendum)
[facility] Provider Note   CSN: [id number] Arrival date & time: 11/28/20  1660     History Chief Complaint  Patient presents with  . Chest Pain    Krystal Mcpherson is a 80 y.o. female.  Patient presents to the emergency department for evaluation of chest pain.  Patient reports that the pain began overnight.  She is having aching pain in the left arm with pain in the left and central chest that comes and goes.  Patient reports a history of heart disease, status post stenting approximately 10 years ago.  Patient reports that the pain she is experiencing currently is identical to the pain she had prior to her stent placement.  Patient reports that the pain occurred 2 nights ago as well but then resolved.  She talked to her cardiologist today and was told she cannot be seen until next week and was told to go to the ER.        Past Medical History:  Diagnosis Date  . Allergy   . Anemia    Hx  . Arthritis    neck, lower back  . Change in bowel habits   . Complication of anesthesia   . GERD (gastroesophageal reflux disease)   . HLD (hyperlipidemia)    hx - no med  . HTN (hypertension)   . Migraine   . PONV (postoperative nausea and vomiting)   . Stented coronary artery    right, no problems  . SVD (spontaneous vaginal delivery)    x 2  . Vitamin B12 deficiency     Patient Active Problem List   Diagnosis Date Noted  . Cellulitis 08/09/2016  . Dog bite, hand, unspecified laterality, subsequent encounter 08/09/2016  . Change in bowel habits   . Obesity 03/19/2015  . Angina decubitus ([place]) 08/01/2014  . Atherosclerosis of native coronary artery of native heart without angina pectoris 03/02/2014  . Essential hypertension, benign 03/02/2014  . Bradycardia 03/02/2014    Past Surgical History:  Procedure Laterality Date  . bladder tact    . 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 CHOLECYSTECTOMY    . COLONOSCOPY  2009     [facility]  . cornary stent Right 05/2011  . NASAL SINUS SURGERY     x 2  . TOTAL ABDOMINAL HYSTERECTOMY W/ BILATERAL SALPINGOOPHORECTOMY       OB History   No obstetric history on file.     Family History  Problem Relation Age of Onset  . Colon cancer Paternal Grandmother   . Hypertension Mother   . Heart attack Mother   . Hypertension Father   . Heart attack Father   . Arthritis Sister   . Obesity Sister   . Hypertension Sister   . Thyroid cancer Sister   . Kidney cancer Sister   . Breast cancer Sister   . Hypertension Sister   . Pancreatitis Sister   . Melanoma Sister   . Hypertension Sister   . Esophageal cancer Neg Hx   . Rectal cancer Neg Hx   . Stomach cancer Neg Hx     Social History   Tobacco Use  . Smoking status: Never Smoker  . Smokeless tobacco: Never Used  Substance Use Topics  . Alcohol use: No    Alcohol/week: 0.0 standard drinks  . Drug use: No    Home Medications Prior to Admission medications   Medication Sig Start Date End Date Taking? Authorizing Provider  acetaminophen (  TYLENOL) 500 MG tablet Take 1,000 mg by mouth every 6 (six) hours as needed (pain).   Yes [provider]  aspirin EC 81 MG tablet Take 81 mg by mouth daily.   Yes [provider]  atenolol (TENORMIN) 25 MG tablet Take 1 tablet (25 mg total) by mouth daily. 07/08/20  Yes Jerline Pain, MD  cetirizine (ZYRTEC) 10 MG tablet Take 10 mg by mouth daily.   Yes [provider]  diphenhydrAMINE (BENADRYL) 25 mg capsule Take 25-50 mg by mouth every 6 (six) hours as needed for itching. Reported on 10/30/2015   Yes [provider]  fluticasone (FLONASE) 50 MCG/ACT nasal spray Place 2 sprays into both nostrils as needed for allergies or rhinitis.   Yes [provider]  hydrochlorothiazide (HYDRODIURIL) 25 MG tablet Take 25 mg by mouth daily. 04/17/20  Yes [provider]  olmesartan (BENICAR) 40 MG tablet Take 40 mg by mouth daily. 04/17/20   Yes [provider]  topiramate (TOPAMAX) 50 MG tablet Take 50 mg by mouth daily.   Yes [provider]  Ubrogepant (UBRELVY) 50 MG TABS Take 1 tablet by mouth as needed.   Yes [provider]  albuterol (PROVENTIL HFA;VENTOLIN HFA) 108 (90 BASE) MCG/ACT inhaler Inhale 2 puffs into the lungs every 4 (four) hours as needed for wheezing or shortness of breath. Reported on 10/30/2015    [provider]  nitroGLYCERIN (NITROSTAT) 0.4 MG SL tablet Place 1 tablet (0.4 mg total) under the tongue every 5 (five) minutes as needed for chest pain. 09/12/19   Isaiah Serge, NP  traMADol (ULTRAM) 50 MG tablet Take 50 mg by mouth every 6 (six) hours as needed (pain). Reported on 11/13/2015 03/31/12   [provider]    Allergies    Metronidazole, Atenolol, Atorvastatin, Ciprofloxacin, Fentanyl, Morphine and related, Tetanus toxoids, and Augmentin [amoxicillin-pot clavulanate]  Review of Systems   Review of Systems  Cardiovascular: Positive for chest pain.  All other systems reviewed and are negative.   Physical Exam Updated Vital Signs BP (!) 172/59   Pulse (!) 56   Temp 98.6 F (37 C) (Oral)   Resp 16   Ht 5\' 6"  (1.676 m)   Wt 88.5 kg   SpO2 100%   BMI 31.47 kg/m   Physical Exam Vitals and nursing note reviewed.  Constitutional:      General: She is not in acute distress.    Appearance: Normal appearance. She is well-developed.  HENT:     Head: Normocephalic and atraumatic.     Right Ear: Hearing normal.     Left Ear: Hearing normal.     Nose: Nose normal.  Eyes:     Conjunctiva/sclera: Conjunctivae normal.     Pupils: Pupils are equal, round, and reactive to light.  Cardiovascular:     Rate and Rhythm: Regular rhythm.     Heart sounds: S1 normal and S2 normal. No murmur heard. No friction rub. No gallop.   Pulmonary:     Effort: Pulmonary effort is normal. No respiratory distress.     Breath sounds: Normal breath sounds.  Chest:      Chest wall: No tenderness.  Abdominal:     General: Bowel sounds are normal.     Palpations: Abdomen is soft.     Tenderness: There is no abdominal tenderness. There is no guarding or rebound. Negative signs include Murphy's sign and McBurney's sign.     Hernia: No hernia is present.  Musculoskeletal:        General: Normal range of motion.     Cervical back: Normal range of motion and neck supple.  Skin:    General: Skin is warm and dry.     Findings: No rash.  Neurological:     Mental Status: She is alert and oriented to person, place, and time.     GCS: GCS eye subscore is 4. GCS verbal subscore is 5. GCS motor subscore is 6.     Cranial Nerves: No cranial nerve deficit.     Sensory: No sensory deficit.     Coordination: Coordination normal.  Psychiatric:        Speech: Speech normal.        Behavior: Behavior normal.        Thought Content: Thought content normal.     ED Results / Procedures / Treatments   Labs (all labs ordered are listed, but only abnormal results are displayed) Labs Reviewed  BASIC METABOLIC PANEL - Abnormal; Notable for the following components:      Result Value   Potassium 3.4 (*)    Glucose, Bld 107 (*)    Creatinine, Ser 1.16 (*)    GFR, Estimated 49 (*)    All other components within normal limits  TROPONIN I (HIGH SENSITIVITY) - Abnormal; Notable for the following components:   Troponin I (High Sensitivity) 1,622 (*)    All other components within normal limits  RESP PANEL BY RT-PCR (FLU A&B, COVID) ARPGX2  CBC WITH DIFFERENTIAL/PLATELET  D-DIMER, QUANTITATIVE    EKG EKG Interpretation  Date/Time:  Thursday Nov 28 2020 05:24:17 EDT Ventricular Rate:  56 PR Interval:  164 QRS Duration: 96 QT Interval:  447 QTC Calculation: 432 R Axis:   69 Text Interpretation: Sinus rhythm Minimal ST depression, diffuse leads , more pronounced than prior Confirmed by Orpah Greek [phone]) on 11/28/2020 5:47:19 AM   Radiology No results  found.  Procedures Procedures   Medications Ordered in ED Medications  nitroGLYCERIN (NITROSTAT) SL tablet 0.4 mg (has no administration in time range)  nitroGLYCERIN 50 mg in dextrose 5 % 250 mL (0.2 mg/mL) infusion (has no administration in time range)  aspirin chewable tablet 324 mg (324 mg Oral Given 11/28/20 1607)    ED Course  I have reviewed the triage vital signs and the nursing notes.  Pertinent labs & imaging results that were available during my care of the patient were reviewed by me and considered in my medical decision making (see chart for details).    MDM Rules/Calculators/A&P                          Patient presents to the emergency department for evaluation of chest pain.  Patient has had 2 episodes of left arm and left-sided chest pain in the last couple of days.  She comes in tonight because of recurrence.  Patient reports that her symptoms currently are identical to what she had prior to stenting 10 years ago.  Patient is currently on Eliquis for the last 4 weeks because of a DVT.  Symptoms do not suggest acute PE.  EKG with diffuse ST segment depressions, worse than prior.  First troponin 1622.  Presentation consistent with NSTEMI.  Will place on nitroglycerin drip.  Pharmacy consultation for heparin dosing (last dose of Eliquis at 9 PM yesterday).  CRITICAL CARE Performed by: Orpah Greek   Total critical care time: 35 minutes  Critical care time was exclusive of separately billable procedures and treating other patients.  Critical care was necessary to treat or prevent imminent or life-threatening deterioration.  Critical care was time spent personally by me on the following activities: development of treatment plan with patient and/or surrogate as well as nursing, discussions with consultants, evaluation of patient's response to treatment, examination of patient, obtaining history from patient or surrogate, ordering and performing treatments and  interventions, ordering and review of laboratory studies, ordering and review of radiographic studies, pulse oximetry and re-evaluation of patient's condition.  Final Clinical Impression(s) / ED Diagnoses Final diagnoses:  NSTEMI (non-ST elevation myocardial infarction) [facility])    Rx / DC Orders ED Discharge Orders    None       Abram Sax, Gwenyth Allegra, MD 11/28/20 6734    Orpah Greek, MD 11/28/20 [phone]

## 2020-11-28 NOTE — Consult Note (Addendum)
[address]       [place] [address]             [phone]        Jeanifer H Chubbuck [place] Medical Record #[id number] Date of Birth: 1941-06-19  Referring: Dr. Peter Martinique Primary Care: Kristen Loader, FNP Primary Cardiologist:Mark Marlou Porch, MD  Chief Complaint:   Chest pain   History of Present Illness:    Ms. Keough is a very pleasant 80 year old female with past history of coronary artery disease having PTCI with stenting of the right coronary artery proximally in 2012.  She also has a history of hypertension, dyslipidemia, gastroesophageal reflux disease, bradycardia, migraine, and obesity.  About 4 weeks ago she was diagnosed with a deep vein thrombosis of the right popliteal and posterior tibial veins.  She has been on Eliquis daily for about the past 4 weeks.  On 11/26/2020, she began having some left arm pain and numbness.  She contacted her cardiologist's office but was advised to proceed to the emergency room.  She presented to [facility] at about 5 AM today and at that time described having chest pain overnight last night along with left arm discomfort.  She described the pain is intermittent but very similar to the chest pain she had before the right coronary stent placement in 2012.  Initial EKG showed diffuse ST depressions that were considered mild.  She had sinus bradycardia with frequent PVCs.  Initial troponin was 1622 and later rose to 3200.  Having been diagnosed with non-ST elevation myocardial infarction, she was started on heparin and nitroglycerin infusions.  She was transported to [facility] ED this morning for further evaluation and management.    She continued to have 2/6 chest pain through most the morning.  Left heart catheterization was accomplished earlier today and demonstrates severe three-vessel coronary artery disease.  The stent in the proximal right coronary artery is patent with approximately 30% stenosis.  The mid  right coronary artery has a 50% stenosis.  The proximal LAD is 90% stenosis in the ostium of the diagonal is 80% stenosed.  There is a 90% stenosis in the first obtuse marginal coronary artery and the mid circumflex is totally occluded.  There are right to left collaterals.  Left ventriculogram was not performed.  An echocardiogram performed earlier today showed an ejection fraction of 55 to 60% and no significant valvular abnormalities.  Currently, Ms. Maund is resting comfortably and is pain-free on a nitroglycerin infusion.  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  She affirms that these procedures were only done to the lateral aspect of her thighs and lower legs. We have been asked to evaluate Ms. Nawabi for consideration of coronary bypass grafting for multivessel coronary artery disease.       Current Activity/ Functional Status:    Zubrod Score: At the time of surgery this patient's most appropriate activity status/level should be described as: []     0    Normal activity, no symptoms [x]     1    Restricted in physical strenuous activity but ambulatory, able to do out light work []     2    Ambulatory and capable of self care, unable to do work activities,  up and about                 more than 50%  Of the time                            []     3    Only limited self care, in bed greater than 50% of waking hours []     4    Completely disabled, no self care, confined to bed or chair []     5    Moribund  Past Medical History:  Diagnosis Date  . Allergy   . Anemia    Hx  . Arthritis    neck, lower back  . Change in bowel habits   . Complication of anesthesia   . GERD (gastroesophageal reflux disease)   . HLD (hyperlipidemia)    hx - no med  . HTN  (hypertension)   . Migraine   . PONV (postoperative nausea and vomiting)   . Stented coronary artery    right, no problems  . SVD (spontaneous vaginal delivery)    x 2  . Vitamin B12 deficiency     Past Surgical History:  Procedure Laterality Date  . bladder tact    . 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 CHOLECYSTECTOMY    . COLONOSCOPY  2009    stark  . cornary stent Right 05/2011  . NASAL SINUS SURGERY     x 2  . TOTAL ABDOMINAL HYSTERECTOMY W/ BILATERAL SALPINGOOPHORECTOMY      Social History   Tobacco Use  Smoking Status Never Smoker  Smokeless Tobacco Never Used    Social History   Substance and Sexual Activity  Alcohol Use No  . Alcohol/week: 0.0 standard drinks     Allergies  Allergen Reactions  . Metronidazole Hives  . Atorvastatin     Muscle pain   . Ciprofloxacin Hives  . Fentanyl     Itching all over   . Morphine And Related     Hallucinations   . Tetanus Toxoids     Localized knot   . Augmentin [Amoxicillin-Pot Clavulanate] Nausea And Vomiting    Pt has taken Keflex (cephalexin) without adverse reaction. Started 08/06/16    Current Facility-Administered Medications  Medication Dose Route Frequency Provider Last Rate Last Admin  . [START ON 11/29/2020] 0.9 %  sodium chloride infusion  250 mL Intravenous PRN Martinique, Peter M, MD      . 0.9% sodium chloride infusion  1 mL/kg/hr Intravenous Continuous Martinique, Peter M, MD 88.5 mL/hr at 11/28/20 1543 1 mL/kg/hr at 11/28/20 1543  . acetaminophen (TYLENOL) tablet 650 mg  650 mg Oral Q4H PRN Martinique, Peter M, MD      . albuterol (VENTOLIN HFA) 108 (90 Base) MCG/ACT inhaler 2 puff  2 puff Inhalation Q4H PRN Darreld Mclean, PA-C      . [START ON 11/29/2020] aspirin EC tablet 81 mg  81 mg Oral Daily Goodrich, Callie E, PA-C      . doxycycline (VIBRAMYCIN) 50 MG capsule 50 mg  50 mg Oral Daily Sarajane Jews, Callie E, PA-C      . heparin ADULT infusion 100 units/mL (25000 units/257mL)   1,300 Units/hr Intravenous Continuous Robertson, Crystal S, RPH      . loratadine (CLARITIN) tablet 10 mg  10 mg Oral Daily Goodrich, Callie E, PA-C      . nitroGLYCERIN (NITROSTAT) SL tablet 0.4 mg  0.4 mg Sublingual Q5 min PRN Sande Rives E, PA-C      . nitroGLYCERIN 50 mg in dextrose 5 % 250 mL (0.2 mg/mL) infusion  0-200 mcg/min Intravenous Titrated Sande Rives E, PA-C 12 mL/hr at 11/28/20 1239 40 mcg/min at 11/28/20 1239  . ondansetron (ZOFRAN) injection 4 mg  4 mg Intravenous Q6H PRN Martinique, Peter M, MD      . Derrill Memo ON 11/29/2020] sodium chloride flush (NS) 0.9 % injection 3 mL  3 mL Intravenous Q12H Martinique, Peter M, MD      . Derrill Memo ON 11/29/2020] sodium chloride flush (NS) 0.9 % injection 3 mL  3 mL Intravenous PRN Martinique, Peter M, MD      . topiramate (TOPAMAX) tablet 50 mg  50 mg Oral Daily Sande Rives E, PA-C        Medications Prior to Admission  Medication Sig Dispense Refill Last Dose  . acetaminophen (TYLENOL) 500 MG tablet Take 1,000 mg by mouth every 6 (six) hours as needed for mild pain (pain).   11/27/2020 at Unknown time  . apixaban (ELIQUIS) 5 MG TABS tablet Take 5 mg by mouth 2 (two) times daily.   11/27/2020 at Unknown time  . atenolol (TENORMIN) 25 MG tablet Take 1 tablet (25 mg total) by mouth daily. 90 tablet 3 11/27/2020 at 0900  . cetirizine (ZYRTEC) 10 MG tablet Take 10 mg by mouth daily.   11/27/2020 at Unknown time  . diphenhydrAMINE (BENADRYL) 25 mg capsule Take 25-50 mg by mouth every 6 (six) hours as needed for itching. Reported on 10/30/2015   Past Week at Unknown time  . doxycycline (VIBRAMYCIN) 50 MG capsule Take 50 mg by mouth daily.   11/27/2020 at Unknown time  . fluticasone (FLONASE) 50 MCG/ACT nasal spray Place 2 sprays into both nostrils as needed for allergies or rhinitis.   11/27/2020 at Unknown time  . hydrochlorothiazide (HYDRODIURIL) 25 MG tablet Take 25 mg by mouth daily.   11/27/2020 at Unknown time  . olmesartan (BENICAR) 40 MG tablet Take  40 mg by mouth daily.   11/27/2020 at Unknown time  . topiramate (TOPAMAX) 50 MG tablet Take 50 mg by mouth daily.   11/27/2020 at Unknown time  . Ubrogepant (UBRELVY) 50 MG TABS Take 1 tablet by mouth daily as needed (For migraine).   Past Week at Unknown time  . albuterol (PROVENTIL HFA;VENTOLIN HFA) 108 (90 BASE) MCG/ACT inhaler Inhale 2 puffs into the lungs every 4 (four) hours as needed for wheezing or shortness of breath. Reported on 10/30/2015   More than a month at Unknown time  . nitroGLYCERIN (NITROSTAT) 0.4 MG SL tablet Place 1 tablet (0.4 mg total) under the tongue every 5 (five) minutes as needed for chest pain. 25 tablet 3 Unknown at Unknown time  . traMADol (ULTRAM) 50 MG tablet Take 50 mg by mouth every 6 (six) hours as needed (pain). Reported on 11/13/2015   Unknown at Unknown time    Family History  Problem Relation Age of Onset  . Colon cancer Paternal Grandmother   . Hypertension Mother   . Heart attack Mother   . Hypertension Father   . Heart attack Father   . Arthritis Sister   . Obesity Sister   . Hypertension Sister   . Thyroid cancer Sister   . Kidney cancer Sister   . Breast cancer Sister   . Hypertension Sister   . Pancreatitis Sister   . Melanoma Sister   . Hypertension Sister   .  Esophageal cancer Neg Hx   . Rectal cancer Neg Hx   . Stomach cancer Neg Hx      Review of Systems:   ROS     Cardiac Review of Systems: Y or  [    ]= no  Chest Pain [ x   ]  Resting SOB [   ] Exertional SOB  [  ]  Orthopnea [  ]   Pedal Edema [   ]    Palpitations [  ] Syncope  [  ]   Presyncope [   ]  General Review of Systems: [Y] = yes [  ]=no Constitional: recent weight change [  ]; anorexia [  ]; fatigue [  ]; nausea [  ]; night sweats [  ]; fever [  ]; or chills [  ]                                                               Dental: Last Dentist visit: w/in 6 months  Eye : blurred vision [  ]; diplopia [   ]; vision changes [  ];  Amaurosis fugax[  ]; Resp: cough  [  ];  wheezing[  ];  hemoptysis[  ]; shortness of breath[  ]; paroxysmal nocturnal dyspnea[  ]; dyspnea on exertion[  ]; or orthopnea[  ];  GI:  gallstones[  ], vomiting[  ];  dysphagia[  ]; melena[  ];  hematochezia [  ]; heartburn[  ];   Hx of  Colonoscopy[  ]; GU: kidney stones [  ]; hematuria[  ];   dysuria [  ];  nocturia[  ];  history of     obstruction [  ]; urinary frequency [  ]             Skin: rash, swelling[  ];, hair loss[  ];  peripheral edema[ x Right leg ];  or itching[  ]; Musculosketetal: myalgias[  ];  joint swelling[  ];  joint erythema[  ];  joint pain[  ];  back pain[  ];  Heme/Lymph: bruising[  ];  bleeding[  ];  anemia[  ];  Neuro: TIA[  ];  headaches[  ];  stroke[  ];  vertigo[  ];  seizures[  ];   paresthesias[  ];  difficulty walking[  ];  Psych:depression[  ]; anxiety[  ];  Endocrine: diabetes[  ];  thyroid dysfunction[  ];             Physical Exam: BP 128/63   Pulse (!) 53   Temp 98.1 F (36.7 C) (Oral)   Resp 13   Ht 5\' 6"  (1.676 m)   Wt 88.5 kg   SpO2 100%   BMI 31.47 kg/m    General appearance: alert, cooperative and no distress Head: Normocephalic, without obvious abnormality, atraumatic Neck: no adenopathy, no carotid bruit, no JVD, supple, symmetrical, trachea midline and thyroid not enlarged, symmetric, no tenderness/mass/nodules Lymph nodes: No cervical or clavicular adenopathy Resp: clear to auscultation bilaterally Cardio: Regular rhythm, monitor shows sinus bradycardia with a rate of 57.  No murmur was auscultated. GI: Soft and nontender.  Active bowel sounds. Extremities: All are warm and well-perfused.  There are palpable distal pulses throughout.  She does have a TR band over  the right radial artery that is in the process of being decompressed.  She has some superficial varicosities in her lower extremities.  I did not see any evidence of incisions overlying the path of the saphenous vein in either leg. Neurologic: Grossly  normal  Diagnostic Studies & Laboratory data:      LEFT HEART CATH AND CORONARY ANGIOGRAPHY    Conclusion    Prox LAD to Mid LAD lesion is 90% stenosed.  1st Diag lesion is 80% stenosed.  1st Mrg lesion is 90% stenosed.  Mid Cx lesion is 100% stenosed.  Prox RCA lesion is 30% stenosed.  Prox RCA to Mid RCA lesion is 50% stenosed.  LV end diastolic pressure is mildly elevated.   1. 3 vessel obstructive CAD    - 90% proximal to mid LAD, 80% first diagonal    - 90% large OM1, 100% LCX post OM1 with right to left collaterals.    - Patent stent in the proximal RCA. 50% mid vessel 2. Mildly elevated LVEDP  Plan: recommend CT surgery consult for CABG.    Recommendations  Antiplatelet/Anticoag Recommend Aspirin 81mg  daily for moderate CAD.   Indications  Non-ST elevation (NSTEMI) myocardial infarction ([place]) [I21.4 (ICD-10-CM)]   Procedural Details  Technical Details Indication: 80 yo WF with remote stenting of the RCA in 2012 presents with a NSTEMI  Procedural Details: The right wrist was prepped, draped, and anesthetized with 1% lidocaine. Using the modified Seldinger technique, a 6 French slender sheath was introduced into the right radial artery. 3 mg of verapamil was administered through the sheath, weight-based unfractionated heparin was administered intravenously. Standard Judkins catheters were used for selective coronary angiography and left ventriculography. Catheter exchanges were performed over an exchange length guidewire. There were no immediate procedural complications. A TR band was used for radial hemostasis at the completion of the procedure.  The patient was transferred to the post catheterization recovery area for further monitoring. Contrast: 65 cc Estimated blood loss <50 mL.   During this procedure medications were administered to achieve and maintain moderate conscious sedation while the patient's heart rate, blood pressure, and oxygen saturation  were continuously monitored and I was present face-to-face 100% of this time.   Medications (Filter: Administrations occurring from 1440 to 1528 on 11/28/20) (important) Continuous medications are totaled by the amount administered until 11/28/20 1528.    Heparin (Porcine) in NaCl 1000-0.9 UT/500ML-% SOLN (mL) Total volume:  1,000 mL  Date/Time Rate/Dose/Volume Action   11/28/20 1446 500 mL Given   1446 500 mL Given    midazolam (VERSED) injection (mg) Total dose:  1 mg  Date/Time Rate/Dose/Volume Action   11/28/20 1455 1 mg Given    lidocaine (PF) (XYLOCAINE) 1 % injection (mL) Total volume:  2 mL  Date/Time Rate/Dose/Volume Action   11/28/20 1456 2 mL Given    Radial Cocktail/Verapamil only (mL) Total volume:  10 mL  Date/Time Rate/Dose/Volume Action   11/28/20 1457 10 mL Given    heparin sodium (porcine) injection (Units) Total dose:  4,500 Units  Date/Time Rate/Dose/Volume Action   11/28/20 1457 4,500 Units Given    iohexol (OMNIPAQUE) 350 MG/ML injection (mL) Total volume:  65 mL  Date/Time Rate/Dose/Volume Action   11/28/20 1511 65 mL Given    nitroGLYCERIN (NITROSTAT) SL tablet 0.4 mg (mg) Total dose:  Cannot be calculated* Dosing weight:  88.5  *Administration dose not documented Date/Time Rate/Dose/Volume Action   11/28/20 1440 *Not included in total MAR Hold    nitroGLYCERIN 50  mg in dextrose 5 % 250 mL (0.2 mg/mL) infusion (mcg/min) Total dose:  Cannot be calculated* Dosing weight:  88.5  *Administration dose not documented Date/Time Rate/Dose/Volume Action   11/28/20 1440 *Not included in total MAR Hold    heparin ADULT infusion 100 units/mL (25000 units/265mL) (Units/hr) Total dose:  Cannot be calculated* Dosing weight:  88.5  *Administration dose not documented Date/Time Rate/Dose/Volume Action   11/28/20 1441  Stopped    Sedation Time  Sedation Time Physician-1: 15 minutes 30 seconds   Contrast  Medication Name Total Dose   iohexol (OMNIPAQUE) 350 MG/ML injection 65 mL    Radiation/Fluoro  Fluoro time: 2.5 (min) DAP: 9.6 (Gycm2) Cumulative Air Kerma: 937.9 (mGy)   Complications   Complications documented before study signed (11/28/2020 0:24 PM)    No complications were associated with this study.  Documented by Martinique, Peter M, MD - 11/28/2020 3:18 PM     Coronary Findings   Diagnostic Dominance: Right  Left Main  The vessel originates from a separate ostium.  Left Anterior Descending  Prox LAD to Mid LAD lesion is 90% stenosed. The lesion is located at the bifurcation and segmental.  First Diagonal Branch  1st Diag lesion is 80% stenosed.  First Septal Branch  Collaterals  1st Sept filled by collaterals from [place].    Left Circumflex  Mid Cx lesion is 100% stenosed.  First Obtuse Marginal Branch  Vessel is large in size.  1st Mrg lesion is 90% stenosed.  Second Obtuse Marginal Branch  Collaterals  2nd Mrg filled by collaterals from 3rd RPL.    Right Coronary Artery  Prox RCA lesion is 30% stenosed. The lesion was previously treated using a drug eluting stent over 2 years ago.  Prox RCA to Mid RCA lesion is 50% stenosed.   Intervention   No interventions have been documented.  Left Heart  Left Ventricle LV end diastolic pressure is mildly elevated.   Coronary Diagrams   Diagnostic Dominance: Right       ECHOCARDIOGRAM REPORT       Patient Name:  CHEETARA HOGE Date of Exam: 11/28/2020  Medical Rec #: [id number]  Height:    66.0 in  Accession #:  [id number] Weight:    195.0 lb  Date of Birth: 1941/03/29 BSA:     1.978 m  Patient Age:  34 years  BP:      128/63 mmHg  Patient Gender: F      HR:      53 bpm.  Exam Location: Inpatient   Procedure: 2D Echo   Indications:  NSTEMI    History:    Patient has no prior history of Echocardiogram  examinations.    Sonographer:  Johny Chess  Referring Phys:  [id number] [place]    1. Left ventricular ejection fraction, by estimation, is 55 to 60%. The  left ventricle has normal function. The left ventricle demonstrates  regional wall motion abnormalities (see scoring diagram/findings for  description). Left ventricular diastolic  parameters are consistent with Grade I diastolic dysfunction (impaired  relaxation).  2. Right ventricular systolic function is normal. The right ventricular  size is normal. There is normal pulmonary artery systolic pressure. The  estimated right ventricular systolic pressure is 92.1 mmHg.  3. The mitral valve is grossly normal. Trivial mitral valve  regurgitation. No evidence of mitral stenosis.  4. The aortic valve is tricuspid. Aortic valve regurgitation is not  visualized. No aortic stenosis is present.  5. The inferior vena cava is normal in size with greater than 50%  respiratory variability, suggesting right atrial pressure of 3 mmHg.   FINDINGS  Left Ventricle: Left ventricular ejection fraction, by estimation, is 55  to 60%. The left ventricle has normal function. The left ventricle  demonstrates regional wall motion abnormalities. The left ventricular  internal cavity size was normal in size.  There is no left ventricular hypertrophy. Left ventricular diastolic  parameters are consistent with Grade I diastolic dysfunction (impaired  relaxation).     LV Wall Scoring:  The posterior wall is hypokinetic.   Right Ventricle: The right ventricular size is normal. No increase in  right ventricular wall thickness. Right ventricular systolic function is  normal. There is normal pulmonary artery systolic pressure. The tricuspid  regurgitant velocity is 2.25 m/s, and  with an assumed right atrial pressure of 3 mmHg, the estimated right  ventricular systolic pressure is 16.0 mmHg.   Left Atrium: Left atrial size was normal in size.   Right Atrium: Right atrial size was normal  in size.   Pericardium: Trivial pericardial effusion is present.   Mitral Valve: The mitral valve is grossly normal. Trivial mitral valve  regurgitation. No evidence of mitral valve stenosis.   Tricuspid Valve: The tricuspid valve is grossly normal. Tricuspid valve  regurgitation is trivial. No evidence of tricuspid stenosis.   Aortic Valve: The aortic valve is tricuspid. Aortic valve regurgitation is  not visualized. No aortic stenosis is present.   Pulmonic Valve: The pulmonic valve was grossly normal. Pulmonic valve  regurgitation is trivial. No evidence of pulmonic stenosis.   Aorta: The aortic root and ascending aorta are structurally normal, with  no evidence of dilitation.   Venous: The right upper pulmonary vein is normal. The inferior vena cava  is normal in size with greater than 50% respiratory variability,  suggesting right atrial pressure of 3 mmHg.   IAS/Shunts: The interatrial septum appears to be lipomatous. The atrial  septum is grossly normal.     LEFT VENTRICLE  PLAX 2D  LVIDd:     4.30 cm Diastology  LVIDs:     3.10 cm LV e' medial:  5.44 cm/s  LV PW:     1.10 cm LV E/e' medial: 13.9  LV IVS:    1.00 cm LV e' lateral:  5.87 cm/s  LVOT diam:   1.90 cm LV E/e' lateral: 12.8  LV SV:     65  LV SV Index:  33  LVOT Area:   2.84 cm     RIGHT VENTRICLE       IVC  RV S prime:   13.20 cm/s IVC diam: 1.10 cm  TAPSE (M-mode): 2.0 cm   LEFT ATRIUM       Index    RIGHT ATRIUM      Index  LA diam:    3.80 cm 1.92 cm/m RA Area:   14.20 cm  LA Vol (A2C):  42.6 ml 21.53 ml/m RA Volume:  31.90 ml 16.12 ml/m  LA Vol (A4C):  37.4 ml 18.90 ml/m  LA Biplane Vol: 40.3 ml 20.37 ml/m  AORTIC VALVE  LVOT Vmax:  94.30 cm/s  LVOT Vmean: 60.000 cm/s  LVOT VTI:  0.229 m    AORTA  Ao Root diam: 2.70 cm  Ao Asc diam: 3.00 cm   MITRAL VALVE        TRICUSPID VALVE  MV Area (PHT):  2.14 cm   TR Peak grad:  20.2  mmHg  MV Decel Time: 354 msec   TR Vmax:    225.00 cm/s  MV E velocity: 75.40 cm/s  MV A velocity: 106.00 cm/s SHUNTS  MV E/A ratio: 0.71     Systemic VTI: 0.23 m               Systemic Diam: 1.90 cm   Eleonore Chiquito MD  Electronically signed by Eleonore Chiquito MD  Signature Date/Time: 11/28/2020/5:11:17 PM    Recent Radiology Findings:       I have independently reviewed the above radiologic studies and discussed with the patient   Recent Lab Findings: Lab Results  Component Value Date   WBC 6.9 11/28/2020   HGB 12.9 11/28/2020   HCT 39.5 11/28/2020   PLT 227 11/28/2020   GLUCOSE 107 (H) 11/28/2020   ALT 15 08/10/2016   AST 15 08/10/2016   NA 136 11/28/2020   K 3.4 (L) 11/28/2020   CL 99 11/28/2020   CREATININE 1.16 (H) 11/28/2020   BUN 22 11/28/2020   CO2 27 11/28/2020      Assessment / Plan:    -Very pleasant 80 year old female with a past medical history as described above presented to [facility] earlier today with chest and arm pain of about 12 hours duration.  She ruled in for acute non-ST elevation myocardial infarction.  She was started on heparin and nitroglycerin with she was transferred to [facility] for further evaluation.  Left heart catheterization demonstrates severe multivessel coronary disease with preserved left ventricular function identified on the echo this afternoon.  She has no significant valvular disease.  Coronary bypass grafting is indicated for her multivessel coronary disease.  She would like to be considered for surgery.  Tentatively scheduled coronary bypass grafting from this Friday on 12/04/2020.  She is currently resting comfortably with 1-2 over 10 chest discomfort.  Ms. Sergent son was in the room during this entire interview.  Preop evaluation, the general process of coronary bypass grafting, and the expected postoperative recovery was discussed with both of them their  questions were answered and they would like for [place] to proceed with plans for surgery as outlined. We will obtain screening carotid artery duplex and lower extremity arterial duplex scans.  We will also request saphenous vein mapping given her history of varicosities that have been treated both with surgical ligation and stripping as well as with the use of injected sclerosing agents.  I will also order a chest x-ray since I cannot find any report of  one being done since her presentation earlier this morning.    I  spent 25 minutes counseling the patient face to face.  Antony Odea, PA-C [phone]   11/28/2020 4:27 PM   Chart reviewed, cath films and echo reviewed, patient examined, agree with above.  She has had stuttering chest pain for a few days prior to admission and ruled in for non-ST segment elevation MI.  Cardiac catheterization shows diffusely diseased proximal LAD with 90% stenosis.  There is a small first diagonal that has 80% stenosis.  The left circumflex gives off a large marginal branch that has 90% proximal stenosis and then is occluded after that.  The right coronary artery has a patent stent in the proximal to midportion with 30% in-stent restenosis.  There is about 50% stenosis beyond the stent.  2D echocardiogram today shows an EF of 55 to 60% with posterior hypokinesis.  There is no significant valvular disease.  She has been stable  today without chest pain on nitroglycerin and will have heparin started later tonight.  She has been on Eliquis prior to admission for DVT.  This has been held.  I agree with the need for coronary bypass graft surgery.  She has history of varicose veins in her legs and underwent surgical ligation and stripping as well as use of injected sclerosing agents in the past.  She will require vein mapping.  She will have upper extremity arterial Dopplers to decide if her left radial artery could be used as a conduit.  She was cath through the right  radial artery.  She will tentatively be scheduled for next Wednesday by Dr. Orvan Seen after she has had adequate time for Eliquis to washout.  I discussed the catheterization and echo results with the patient and her son.  We discussed the surgical procedure, benefits, and risks.  All of their questions have been answered.

## 2020-11-28 NOTE — ED Notes (Signed)
Pt complaining of nausea prior to [facility] departure, ativan ordered as pt has a prolonged QT interval

## 2020-11-28 NOTE — Progress Notes (Signed)
TCTS consulted for CABG evaluation. °

## 2020-11-28 NOTE — Progress Notes (Signed)
ANTICOAGULATION CONSULT NOTE - Initial Consult  Pharmacy Consult for Heparin Indication: 3VCAD and recent DVT  Allergies  Allergen Reactions  . Metronidazole Hives  . Atorvastatin     Muscle pain   . Ciprofloxacin Hives  . Fentanyl     Itching all over   . Morphine And Related     Hallucinations   . Tetanus Toxoids     Localized knot   . Augmentin [Amoxicillin-Pot Clavulanate] Nausea And Vomiting    Pt has taken Keflex (cephalexin) without adverse reaction. Started 08/06/16    Patient Measurements: Height: 5\' 6"  (167.6 cm) Weight: 88.5 kg (195 lb) IBW/kg (Calculated) : 59.3 Heparin Dosing Weight:  80kg  Vital Signs: Temp: 98.1 F (36.7 C) (05/26 1235) Temp Source: Oral (05/26 1235) BP: 128/63 (05/26 1400) Pulse Rate: 53 (05/26 1400)  Labs: Recent Labs    11/28/20 0613 11/28/20 0716 11/28/20 0819  HGB 12.9  --   --   HCT 39.5  --   --   PLT 227  --   --   APTT  --  31  --   HEPARINUNFRC  --  >1.10*  --   CREATININE 1.16*  --   --   TROPONINIHS 1,622*  --  3,242*    Estimated Creatinine Clearance: 45.5 mL/min (A) (by C-G formula based on SCr of 1.16 mg/dL (H)).   Medical History: Past Medical History:  Diagnosis Date  . Allergy   . Anemia    Hx  . Arthritis    neck, lower back  . Change in bowel habits   . Complication of anesthesia   . GERD (gastroesophageal reflux disease)   . HLD (hyperlipidemia)    hx - no med  . HTN (hypertension)   . Migraine   . PONV (postoperative nausea and vomiting)   . Stented coronary artery    right, no problems  . SVD (spontaneous vaginal delivery)    x 2  . Vitamin B12 deficiency    Assessment: CC/HPI: CP  PMH: history of CAD with prior stenting RCA in 05/2011 with low risk Myoview in 2016, palpitations with brief runs of SVT noted on monitor in 2020, DVT 11/01/2020 on Eliquis, hypertension, hyperlipidemia not on statin, GERD, arthritis, rosacea currently on Doxycycline, migraines, and Raynaud's syndrome,  anemia, arthritis, migraines, B12 def  Significant events: patient is not on a statin due to concerns about potential Alzheimer's. She has been educated on reason for a statin given history of CAD in the past but has still declined.  Anticoag: Heparin for recent DVT, ACS (PTA apixaban - LD 5/25 PM) - CBC WNL, no bleeding noted, BL aPTT 31, HL >1.1 baseline - 5/26 Cath: 3VCAD . CVTS consult for CABG. Sheath out 1515  Goal of Therapy:  Heparin level 0.3-0.7 units/ml Monitor platelets by anticoagulation protocol: Yes   Plan:  Resume IV heparin 8hrs post-sheath removal at 2315 at 1300 units/hr (for DVT) Check heparin level, aPTT, and CBC daily   Alexee Delsanto S. Alford Highland, PharmD, BCPS Clinical Staff Pharmacist [email or web address] Alford Highland, Davon Folta Stillinger 11/28/2020,3:43 PM

## 2020-11-28 NOTE — H&P (Signed)
Cardiology Admission History and Physical:   Patient ID: Krystal Mcpherson MRN: [id number]; DOB: Nov 09, 1940   Admission date: 11/28/2020  PCP:  Kristen Loader, FNP   [facility] Providers Cardiologist:  Candee Furbish, MD   {  Chief Complaint: chest pain  Patient Profile:   Krystal Mcpherson is a 80 y.o. female with a history of CAD with prior stenting RCA in 05/2011 with low risk Myoview in 2016, palpitations with brief runs of SVT noted on monitor in 2020, DVT diagnosed on 11/01/2020 on Eliquis, hypertension, hyperlipidemia not on statin, GERD, arthritis, rosacea currently on Doxycycline, migraines, and Raynaud's syndrome who presented on 11/28/2020 with chest pain and was found to have a NSTEMI.   History of Present Illness:   Krystal Mcpherson is a 80 year old female with the above history who is followed by Dr. Marlou Porch. Patient has a history of CAD with prior stenting to RCA in 2012. Last ischemic evaluation was a Myoview in 2016 which was low risk with no evidence of ischemia. Patient was last seen by Dr. Marlou Porch in 05/2020 at which time she was doing well from a cardiac standpoint. Of note, patient is not on a statin due to concerns about potential Alzheimer's. She has been educated on reason for a statin given history of CAD in the past but has still declined.  Patient presented to the [facility] today for further evaluation of chest pain. Patient reports she started having chest "discomfort" and left arm "pain" on Monday night (5/23).  She described the chest pain as "indigestion" and "pressure" that radiates to her back and between her shoulder blades and described the arm pain as a burning/numbness sensation that extended all the way down to her hand.  She had mild nausea associated with this pain but no shortness of breath, vomiting, diaphoresis.  The pain lasted from about 8:30 PM till 11 PM.  It reminded her of her prior cardiac pain before PCI in 2012.  She was finally able to go to sleep and  woke up feeling okay.  She called our office on Tuesday (5/24) and follow-up visit was made for next week.  She was advised to go to the ED if she had recurrent symptoms.  Symptoms returned yesterday (5/25) evening around 8:30 PM.  She again describes the chest discomfort as indigestion but thought this may have just been related to eating Janine Limbo. However, symptoms persisted for couple of hours so she called 911.  EMS came out and did an EKG which was unremarkable are planning on transferring her to [facility].  Patient declined because she was feeling better.  However after EMS left, symptoms returned and she had family drive her to [place].  She does report a little dyspnea on exertion yesterday as well as some orthopnea on Monday night and yseterday night when she was having chest pain.  No PND.  She has some chronic lower extremity edema if she is on her feet all day but this is stable.  She has occasional palpitations and lightheadedness presumed to be due to brief runs of SVT as noted above. These symptoms have significantly improved with increased dose of atenolol.  No syncope.  No recent fevers or illnesses she has some postnasal drip which she assumes is from allergies.  No cough.  No abnormal bleeding in urine or stools.  In the ED, patient mildly bradycardic with rates in the 40s to 50s and hypertensive with systolic BP  as high as the 180s. Initial EKG showed sinus bradycardia with very slight diffuse ST depression that resolved on repeat EKG. High-sensitivity troponin elevated at 1,622 >> 3,242. D-dimer negative. WBC 6.9, Hgb 12.9, Plts 227. Na 136, K 3.4, Glucose 107, Cr 1.16, BUN 22. Respiratory panel negative for COVID-19 and influenza A/B. She was started on IV Heparin and IV Nitroglycerin. Initial plan was to transfer her to [facility] directly to progressive unit under Cardiology service. However, due to patient requiring multiple titration of IV Nitro for control of her  chest pain, she was transferred to [facility] ED.   At the time of this evaluation, patient is resting comfortably. She still reports that she is having 2/10 chest discomfort but does not look like she is in any distress. Of note, patient has a history of migraines. She had a migraine on Friday (2/20) and has a persistent headache since then. No stroke symptoms.   She denies any tobacco, alcohol, or drug use. She does have a family history of heart disease with both her parents.  Past Medical History:  Diagnosis Date  . Allergy   . Anemia    Hx  . Arthritis    neck, lower back  . Change in bowel habits   . Complication of anesthesia   . GERD (gastroesophageal reflux disease)   . HLD (hyperlipidemia)    hx - no med  . HTN (hypertension)   . Migraine   . PONV (postoperative nausea and vomiting)   . Stented coronary artery    right, no problems  . SVD (spontaneous vaginal delivery)    x 2  . Vitamin B12 deficiency     Past Surgical History:  Procedure Laterality Date  . bladder tact    . 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 CHOLECYSTECTOMY    . COLONOSCOPY  2009    [facility]  . cornary stent Right 05/2011  . NASAL SINUS SURGERY     x 2  . TOTAL ABDOMINAL HYSTERECTOMY W/ BILATERAL SALPINGOOPHORECTOMY       Medications Prior to Admission: Prior to Admission medications   Medication Sig Start Date End Date Taking? Authorizing Provider  acetaminophen (TYLENOL) 500 MG tablet Take 1,000 mg by mouth every 6 (six) hours as needed (pain).   Yes [provider]  aspirin EC 81 MG tablet Take 81 mg by mouth daily.   Yes [provider]  atenolol (TENORMIN) 25 MG tablet Take 1 tablet (25 mg total) by mouth daily. 07/08/20  Yes Jerline Pain, MD  cetirizine (ZYRTEC) 10 MG tablet Take 10 mg by mouth daily.   Yes [provider]  diphenhydrAMINE (BENADRYL) 25 mg capsule Take 25-50 mg by mouth every 6 (six) hours as needed for itching. Reported  on 10/30/2015   Yes [provider]  fluticasone (FLONASE) 50 MCG/ACT nasal spray Place 2 sprays into both nostrils as needed for allergies or rhinitis.   Yes [provider]  hydrochlorothiazide (HYDRODIURIL) 25 MG tablet Take 25 mg by mouth daily. 04/17/20  Yes [provider]  olmesartan (BENICAR) 40 MG tablet Take 40 mg by mouth daily. 04/17/20  Yes [provider]  topiramate (TOPAMAX) 50 MG tablet Take 50 mg by mouth daily.   Yes [provider]  Ubrogepant (UBRELVY) 50 MG TABS Take 1 tablet by mouth as needed.   Yes [provider]  albuterol (PROVENTIL HFA;VENTOLIN HFA) 108 (90 BASE) MCG/ACT inhaler Inhale 2 puffs into  the lungs every 4 (four) hours as needed for wheezing or shortness of breath. Reported on 10/30/2015    [provider]  nitroGLYCERIN (NITROSTAT) 0.4 MG SL tablet Place 1 tablet (0.4 mg total) under the tongue every 5 (five) minutes as needed for chest pain. 09/12/19   Isaiah Serge, NP  traMADol (ULTRAM) 50 MG tablet Take 50 mg by mouth every 6 (six) hours as needed (pain). Reported on 11/13/2015 03/31/12   [provider]     Allergies:    Allergies  Allergen Reactions  . Metronidazole Hives  . Atorvastatin     Muscle pain   . Ciprofloxacin Hives  . Fentanyl     Itching all over   . Morphine And Related     Hallucinations   . Tetanus Toxoids     Localized knot   . Augmentin [Amoxicillin-Pot Clavulanate] Nausea And Vomiting    Pt has taken Keflex (cephalexin) without adverse reaction. Started 08/06/16    Social History:   Social History   Socioeconomic History  . Marital status: Widowed    Spouse name: Not on file  . Number of children: Not on file  . Years of education: Not on file  . Highest education level: Not on file  Occupational History  . Not on file  Tobacco Use  . Smoking status: Never Smoker  . Smokeless tobacco: Never Used  Substance and Sexual Activity  . Alcohol use:  No    Alcohol/week: 0.0 standard drinks  . Drug use: No  . Sexual activity: Never    Birth control/protection: Post-menopausal  Other Topics Concern  . Not on file  Social History Narrative  . Not on file   Social Determinants of Health   Financial Resource Strain: Not on file  Food Insecurity: Not on file  Transportation Needs: Not on file  Physical Activity: Not on file  Stress: Not on file  Social Connections: Not on file  Intimate Partner Violence: Not on file    Family History:  The patient's family history includes Arthritis in her sister; Breast cancer in her sister; Colon cancer in her paternal grandmother; Heart attack in her father and mother; Hypertension in her father, mother, sister, sister, and sister; Kidney cancer in her sister; Melanoma in her sister; Obesity in her sister; Pancreatitis in her sister; Thyroid cancer in her sister. There is no history of Esophageal cancer, Rectal cancer, or Stomach cancer.    ROS:  Please see the history of present illness.  Review of Systems  Constitutional: Negative for chills, diaphoresis and fever.  HENT: Congestion: postnasal drip.   Respiratory: Positive for shortness of breath. Negative for cough.   Cardiovascular: Positive for chest pain, palpitations, orthopnea and leg swelling. Negative for PND.  Gastrointestinal: Positive for nausea. Negative for blood in stool and vomiting.  Genitourinary: Negative for hematuria.  Musculoskeletal: Positive for back pain. Negative for myalgias.  Neurological: Negative for dizziness and loss of consciousness.  Endo/Heme/Allergies: Does not bruise/bleed easily.  Psychiatric/Behavioral: Negative for substance abuse.   Physical Exam/Data:   Vitals:   11/28/20 1300 11/28/20 1310 11/28/20 1320 11/28/20 1330  BP: (!) 130/53 (!) 135/59 (!) 124/59 (!) 105/51  Pulse: (!) 51 (!) 50 (!) 49 (!) 50  Resp: 16 15 11 13   Temp:      TempSrc:      SpO2: 100% 100% 100% 100%  Weight:      Height:        No intake or output data  in the 24 hours ending 11/28/20 1400 Last 3 Weights 11/28/2020 11/28/2020 05/24/2020  Weight (lbs) 195 lb 195 lb 201 lb  Weight (kg) 88.451 kg 88.451 kg 91.173 kg     Body mass index is 31.47 kg/m.  General: 80 y.o. female resting comfortably in no acute distress. HEENT: Normocephalic and atraumatic. Sclera clear.  Neck: Supple. No carotid bruits. No JVD. Heart: Bradycardia with normal rhythm. Distinct S1 and S2. No murmurs, gallops, or rubs. Radial and posterior tibial pulses 2+ and equal bilaterally. Lungs: No increased work of breathing. Clear to ausculation bilaterally. No wheezes, rhonchi, or rales.  Abdomen: Soft, non-distended, and non-tender to palpation. Bowel sounds present.  MSK: Normal strength and tone for age. Extremities: No lower extremity edema.    Skin: Cool and dry. Neuro: Alert and oriented x3. No focal deficits. Psych: Normal affect. Responds appropriately.  EKG:  The following ECGs were personally reviewed: - ECG on 11/28/2020 at 5:24am: Sinus bradycardia, rate 56 bpm, with diffuse slight ST depressions. Normal axis. QTc 432 ms.  - ECG on 11/28/2020: at 8:57am: Sinus bradycardia, rate 47 bpm, with no acute ST/T changes (ST depression resolved). Normal axis. QTc 432 ms. - ECG on 11/28/2020 at 10:59am: Catches transition between sinus tachycardia back to baseline sinus bradycardia with ventricular couplet.   Relevant CV Studies:  Lexiscan Myoview 08/05/2017:  Nuclear stress EF: 62%.  No T wave inversion was noted during stress.  There was no ST segment deviation noted during stress.  This is a low risk study.   Normal perfusion. LVEF 62% with normal wall motion. This is a low risk study. _______________  Monitor 10/31/2018 to 11/14/2018:  Brief, less than 5 beat runs of supraventricular tachycardia, fastest at 160 bpm, appears to be paroxysmal atrial tachycardia.  No evidence of atrial fibrillation or flutter.  No significant  pauses noted.  Occasionally symptoms of heart fluttering are associated with the short SVT lasting 4-5 beats.   Occasional heart fluttering or skipping could be associated with the short-lived SVT episodes.  These could be conservatively with no further medication as they should be benign.  However, if desired, we can increase the atenolol from 12.5 mg once a day to 25 mg a day.  This may help calm down some of these skipped beats. _______________  Lower Extremity Ultrasound 11/01/2020: Summary:  RIGHT:  - Findings consistent with acute deep vein thrombosis involving the right  popliteal vein, and right posterior tibial veins.  - No cystic structure found in the popliteal fossa.    LEFT:  - No evidence of deep vein thrombosis in the lower extremity. No indirect  evidence of obstruction proximal to the inguinal ligament.  - No cystic structure found in the popliteal fossa.    Laboratory Data:  High Sensitivity Troponin:   Recent Labs  Lab 11/28/20 0613 11/28/20 0819  TROPONINIHS 1,622* 3,242*      Chemistry Recent Labs  Lab 11/28/20 0613  NA 136  K 3.4*  CL 99  CO2 27  GLUCOSE 107*  BUN 22  CREATININE 1.16*  CALCIUM 9.7  GFRNONAA 49*  ANIONGAP 10    No results for input(s): PROT, ALBUMIN, AST, ALT, ALKPHOS, BILITOT in the last 168 hours. Hematology Recent Labs  Lab 11/28/20 0613  WBC 6.9  RBC 4.66  HGB 12.9  HCT 39.5  MCV 84.8  MCH 27.7  MCHC 32.7  RDW 13.2  PLT 227   BNPNo results for input(s): BNP, PROBNP in the last 168 hours.  DDimer  Recent Labs  Lab 11/28/20 [phone]  DDIMER 0.35     Radiology/Studies:  No results found.   Assessment and Plan:   NSTEMI History of CAD - Patient has history of CAD with prior stenting to RCA in 2012. Now presents with chest pain that radiates to back and down left arm. Similar to pain before prior PCI. - Initial EKG shows slight ST depression diffusions that had resolved on repeat tracing. - High-sensitivity  troponin elevated at 1,622 >> 3,242.  - Currently reports 2/10 pain on Nitro drip but appears comfortable. - Continue IV Heparin and IV Nitro. - Will order Echo. - Already received Aspirin 324mg  in the ED. Will start 81mg  daily tomorrow. - Will hold beta-blocker for now given bradycardia with rates in the 40s to 50s. - Patient has been intolerant to statins in the past. She is willing to consider retrying a statin but would like to wait to see if she has had progression of CAD. - Will need cardiac catheterization. Patient is on Eliquis for recent DVT. Last dose was last night at 9:00PM. Initial plan was to cath tomorrow. However given she is continuing to have waxing/waning symptoms, will go ahead and proceed with LHC today. The patient understands that risks include but are not limited to stroke (1 in 1000), death (1 in 38), kidney failure [usually temporary] (1 in 500), bleeding (1 in 200), allergic reaction [possibly serious] (1 in 200), and agrees to proceed.   Hypertension - BP currently well controlled. - Will hold home Atenolol due to bradycardia. - Will hold Olmesartan and HCTZ for now given on IV Nitro and in anticipation of cardiac catheterization. Can likely restart tomorrow after cath.  Hyperlipidemia - Patient not on statin at home due to intolerance to multiple statins in the past as well as concerns about Dementia with statin. She is willing to retry a statin but would like to wait to see what cath shows. - Will repeat lipid panel.   DVT - Recently diagnosed with right DVT on 11/01/2020. - On Eliquis 5mg  twice daily. Currently on IV Heparin in anticipation for cardiac cath.  Rosacea - Patient on Doxycycline 50mg  daily. Will continue.     Risk Assessment/Risk Scores:   TIMI Risk Score for Unstable Angina or Non-ST Elevation MI:   The patient's TIMI risk score is 5, which indicates a 26% risk of all cause mortality, new or recurrent myocardial infarction or need for urgent  revascularization in the next 14 days.{  Severity of Illness: The appropriate patient status for this patient is OBSERVATION. Observation status is judged to be reasonable and necessary in order to provide the required intensity of service to ensure the patient's safety. The patient's presenting symptoms, physical exam findings, and initial radiographic and laboratory data in the context of their medical condition is felt to place them at decreased risk for further clinical deterioration. Furthermore, it is anticipated that the patient will be medically stable for discharge from the hospital within 2 midnights of admission. The following factors support the patient status of observation.   " The patient's presenting symptoms include chest pain. " The physical exam findings include bradycardia  " The initial radiographic and laboratory data show elevated troponin.     For questions or updates, please contact [place] Please consult [email or web address] for contact info under     Signed, Darreld Mclean, PA-C  11/28/2020 2:00 PM

## 2020-11-28 NOTE — Telephone Encounter (Signed)
Agree with plan Dejane Scheibe, MD  

## 2020-11-28 NOTE — ED Triage Notes (Signed)
Pt arrived as tx from [facility]: [facility] with c/c of Chest Pain. Per [facility], pt diagnosed with NSTEMI at previous hospital. Pt described original chest pain L sided radiating to arm, now 2/10. Pt arrived on Nitro 30mcg/min & heparin 1200u/hr. Pt states she had a stent placed 10 years ago.

## 2020-11-28 NOTE — ED Provider Notes (Signed)
Blood pressure (!) 149/62, pulse (!) 49, temperature 98.3 F (36.8 C), temperature source Oral, resp. rate 11, height 5\' 6"  (1.676 m), weight 88.5 kg, SpO2 99 %.  Assuming care from Dr. Betsey Holiday.  In short, Krystal Mcpherson is a 80 y.o. female with a chief complaint of Chest Pain .  Refer to the original H&P for additional details.  Patient is boarding here in the [place] emergency department awaiting admission.  Patient admitted to the cardiology service by Dr. Melchor Amour after discussion this morning.  Patient's troponin doubled to 3200.  She is not having active chest pain.  I called bed placement and she has a bed ready in the cardiac stepdown unit per cardiology's order.  In trying to coordinate transport the charge nurse tells me that she spoke with nursing staff on the unit who are refusing to accept the patient.  We have paged cardiology but so as not to delay care I have arranged for ED to ED transfer while the patient's ultimate destination is worked out.  Dr. Vanita Panda is accepting the patient.     Margette Fast, MD 11/28/20 1018

## 2020-11-28 NOTE — ED Triage Notes (Signed)
Pt reports midsternal chest pain radiating to left shoulder and arm. Pt reports nausea and pain worse when laying down. Pt has DVT in left leg x 1 month on eloquis.

## 2020-11-29 ENCOUNTER — Encounter (HOSPITAL_COMMUNITY): Payer: Self-pay | Admitting: Cardiology

## 2020-11-29 DIAGNOSIS — I7300 Raynaud's syndrome without gangrene: Secondary | ICD-10-CM | POA: Diagnosis present

## 2020-11-29 DIAGNOSIS — I480 Paroxysmal atrial fibrillation: Secondary | ICD-10-CM | POA: Diagnosis present

## 2020-11-29 DIAGNOSIS — I825Y1 Chronic embolism and thrombosis of unspecified deep veins of right proximal lower extremity: Secondary | ICD-10-CM | POA: Diagnosis not present

## 2020-11-29 DIAGNOSIS — I2510 Atherosclerotic heart disease of native coronary artery without angina pectoris: Secondary | ICD-10-CM | POA: Diagnosis present

## 2020-11-29 DIAGNOSIS — Z7982 Long term (current) use of aspirin: Secondary | ICD-10-CM | POA: Diagnosis not present

## 2020-11-29 DIAGNOSIS — I25110 Atherosclerotic heart disease of native coronary artery with unstable angina pectoris: Secondary | ICD-10-CM | POA: Diagnosis not present

## 2020-11-29 DIAGNOSIS — G43909 Migraine, unspecified, not intractable, without status migrainosus: Secondary | ICD-10-CM | POA: Diagnosis present

## 2020-11-29 DIAGNOSIS — Z888 Allergy status to other drugs, medicaments and biological substances status: Secondary | ICD-10-CM | POA: Diagnosis not present

## 2020-11-29 DIAGNOSIS — I82431 Acute embolism and thrombosis of right popliteal vein: Secondary | ICD-10-CM | POA: Diagnosis present

## 2020-11-29 DIAGNOSIS — Z79899 Other long term (current) drug therapy: Secondary | ICD-10-CM | POA: Diagnosis not present

## 2020-11-29 DIAGNOSIS — Z86718 Personal history of other venous thrombosis and embolism: Secondary | ICD-10-CM | POA: Diagnosis not present

## 2020-11-29 DIAGNOSIS — K5900 Constipation, unspecified: Secondary | ICD-10-CM | POA: Diagnosis present

## 2020-11-29 DIAGNOSIS — I10 Essential (primary) hypertension: Secondary | ICD-10-CM | POA: Diagnosis present

## 2020-11-29 DIAGNOSIS — K219 Gastro-esophageal reflux disease without esophagitis: Secondary | ICD-10-CM | POA: Diagnosis present

## 2020-11-29 DIAGNOSIS — Z20822 Contact with and (suspected) exposure to covid-19: Secondary | ICD-10-CM | POA: Diagnosis present

## 2020-11-29 DIAGNOSIS — L719 Rosacea, unspecified: Secondary | ICD-10-CM | POA: Diagnosis present

## 2020-11-29 DIAGNOSIS — Z9049 Acquired absence of other specified parts of digestive tract: Secondary | ICD-10-CM | POA: Diagnosis not present

## 2020-11-29 DIAGNOSIS — I214 Non-ST elevation (NSTEMI) myocardial infarction: Secondary | ICD-10-CM | POA: Diagnosis present

## 2020-11-29 DIAGNOSIS — M1990 Unspecified osteoarthritis, unspecified site: Secondary | ICD-10-CM | POA: Diagnosis present

## 2020-11-29 DIAGNOSIS — I872 Venous insufficiency (chronic) (peripheral): Secondary | ICD-10-CM | POA: Diagnosis not present

## 2020-11-29 DIAGNOSIS — I82441 Acute embolism and thrombosis of right tibial vein: Secondary | ICD-10-CM | POA: Diagnosis present

## 2020-11-29 DIAGNOSIS — Z951 Presence of aortocoronary bypass graft: Secondary | ICD-10-CM | POA: Diagnosis not present

## 2020-11-29 DIAGNOSIS — Z01810 Encounter for preprocedural cardiovascular examination: Secondary | ICD-10-CM | POA: Diagnosis not present

## 2020-11-29 DIAGNOSIS — Z8249 Family history of ischemic heart disease and other diseases of the circulatory system: Secondary | ICD-10-CM | POA: Diagnosis not present

## 2020-11-29 DIAGNOSIS — E785 Hyperlipidemia, unspecified: Secondary | ICD-10-CM | POA: Diagnosis present

## 2020-11-29 DIAGNOSIS — E782 Mixed hyperlipidemia: Secondary | ICD-10-CM | POA: Diagnosis not present

## 2020-11-29 DIAGNOSIS — Z955 Presence of coronary angioplasty implant and graft: Secondary | ICD-10-CM | POA: Diagnosis not present

## 2020-11-29 DIAGNOSIS — Z90710 Acquired absence of both cervix and uterus: Secondary | ICD-10-CM | POA: Diagnosis not present

## 2020-11-29 DIAGNOSIS — D62 Acute posthemorrhagic anemia: Secondary | ICD-10-CM | POA: Diagnosis not present

## 2020-11-29 DIAGNOSIS — R600 Localized edema: Secondary | ICD-10-CM | POA: Diagnosis present

## 2020-11-29 DIAGNOSIS — E7800 Pure hypercholesterolemia, unspecified: Secondary | ICD-10-CM | POA: Diagnosis not present

## 2020-11-29 DIAGNOSIS — I471 Supraventricular tachycardia: Secondary | ICD-10-CM | POA: Diagnosis present

## 2020-11-29 LAB — BASIC METABOLIC PANEL
Anion gap: 6 (ref 5–15)
BUN: 15 mg/dL (ref 8–23)
CO2: 26 mmol/L (ref 22–32)
Calcium: 9.5 mg/dL (ref 8.9–10.3)
Chloride: 104 mmol/L (ref 98–111)
Creatinine, Ser: 1.08 mg/dL — ABNORMAL HIGH (ref 0.44–1.00)
GFR, Estimated: 53 mL/min — ABNORMAL LOW (ref >60)
Glucose, Bld: 129 mg/dL — ABNORMAL HIGH (ref 70–99)
Potassium: 3.7 mmol/L (ref 3.5–5.1)
Sodium: 136 mmol/L (ref 135–145)

## 2020-11-29 LAB — CBC
HCT: 35.9 % — ABNORMAL LOW (ref 36.0–46.0)
Hemoglobin: 11.9 g/dL — ABNORMAL LOW (ref 12.0–15.0)
MCH: 27.3 pg (ref 26.0–34.0)
MCHC: 33.1 g/dL (ref 30.0–36.0)
MCV: 82.3 fL (ref 80.0–100.0)
Platelets: 200 10*3/uL (ref 150–400)
RBC: 4.36 MIL/uL (ref 3.87–5.11)
RDW: 13.1 % (ref 11.5–15.5)
WBC: 8.6 10*3/uL (ref 4.0–10.5)
nRBC: 0 % (ref 0.0–0.2)

## 2020-11-29 LAB — LIPID PANEL
Cholesterol: 154 mg/dL (ref 0–200)
HDL: 49 mg/dL (ref >40)
LDL Cholesterol: 94 mg/dL (ref 0–99)
Total CHOL/HDL Ratio: 3.1 RATIO
Triglycerides: 54 mg/dL (ref <150)
VLDL: 11 mg/dL (ref 0–40)

## 2020-11-29 LAB — APTT: aPTT: 88 seconds — ABNORMAL HIGH (ref 24–36)

## 2020-11-29 LAB — HEMOGLOBIN A1C
Hgb A1c MFr Bld: 5.8 % — ABNORMAL HIGH (ref 4.8–5.6)
Mean Plasma Glucose: 120 mg/dL

## 2020-11-29 LAB — HEPARIN LEVEL (UNFRACTIONATED): Heparin Unfractionated: 1.05 IU/mL — ABNORMAL HIGH (ref 0.30–0.70)

## 2020-11-29 MED ORDER — ROSUVASTATIN CALCIUM 5 MG PO TABS
5.0000 mg | ORAL_TABLET | Freq: Every day | ORAL | Status: DC
  Administered 2020-11-29 – 2020-12-11 (×12): 5 mg via ORAL
  Filled 2020-11-29 (×13): qty 1

## 2020-11-29 MED ORDER — ACETAMINOPHEN 500 MG PO TABS
1000.0000 mg | ORAL_TABLET | Freq: Four times a day (QID) | ORAL | Status: DC | PRN
  Administered 2020-11-29 – 2020-12-03 (×4): 1000 mg via ORAL
  Filled 2020-11-29 (×4): qty 2

## 2020-11-29 NOTE — Progress Notes (Addendum)
Progress Note  Patient Name: Krystal Mcpherson Date of Encounter: 11/29/2020  [facility] Cardiologist: Candee Furbish, MD   Subjective   Cath showed 3V CAD and recommend CT surgery consult for CABG. Echo showed preserved EF. Cath site, right radial, without complications. Patient has some chest twinges overnight on IV Nitro. Breathing is stable. Tele shows heart rates around 60bpm.  Inpatient Medications    Scheduled Meds: . aspirin EC  81 mg Oral Daily  . doxycycline  50 mg Oral Daily  . loratadine  10 mg Oral Daily  . sodium chloride flush  3 mL Intravenous Q12H  . topiramate  50 mg Oral Q2200   Continuous Infusions: . sodium chloride    . heparin 1,300 Units/hr (11/29/20 8338)  . nitroGLYCERIN 5 mcg/min (11/29/20 0706)   PRN Meds: sodium chloride, acetaminophen, albuterol, nitroGLYCERIN, ondansetron (ZOFRAN) IV, sodium chloride flush   Vital Signs    Vitals:   11/28/20 1942 11/28/20 2045 11/29/20 0049 11/29/20 0410  BP: (!) 142/62 (!) 145/48 (!) 116/54 (!) 117/48  Pulse: 62 60 68 62  Resp: 20 20 15 17   Temp: 98.4 F (36.9 C) 98 F (36.7 C) 98.7 F (37.1 C) 98.5 F (36.9 C)  TempSrc: Oral Oral Oral Oral  SpO2: 97% 97% 96% 94%  Weight:      Height:        Intake/Output Summary (Last 24 hours) at 11/29/2020 0736 Last data filed at 11/29/2020 2505 Gross per 24 hour  Intake 596.92 ml  Output 200 ml  Net 396.92 ml   Last 3 Weights 11/28/2020 11/28/2020 05/24/2020  Weight (lbs) 195 lb 195 lb 201 lb  Weight (kg) 88.451 kg 88.451 kg 91.173 kg      Telemetry    SR/SB Heart rate lower 60s - Personally Reviewed  ECG    NSR, PACs, 63bpm - Personally Reviewed  Physical Exam   GEN: No acute distress.   Neck: No JVD Cardiac: RRR, no murmurs, rubs, or gallops.  Respiratory: Clear to auscultation bilaterally. GI: Soft, nontender, non-distended  MS: No edema; No deformity. Neuro:  Nonfocal  Psych: Normal affect   Labs    High Sensitivity Troponin:   Recent  Labs  Lab 11/28/20 0613 11/28/20 0819  TROPONINIHS 1,622* 3,242*      Chemistry Recent Labs  Lab 11/28/20 0613 11/29/20 0609  NA 136 136  K 3.4* 3.7  CL 99 104  CO2 27 26  GLUCOSE 107* 129*  BUN 22 15  CREATININE 1.16* 1.08*  CALCIUM 9.7 9.5  GFRNONAA 49* 53*  ANIONGAP 10 6     Hematology Recent Labs  Lab 11/28/20 0613 11/29/20 0609  WBC 6.9 8.6  RBC 4.66 4.36  HGB 12.9 11.9*  HCT 39.5 35.9*  MCV 84.8 82.3  MCH 27.7 27.3  MCHC 32.7 33.1  RDW 13.2 13.1  PLT 227 200    BNPNo results for input(s): BNP, PROBNP in the last 168 hours.   DDimer  Recent Labs  Lab 11/28/20 [phone]  DDIMER 0.35     Radiology    CARDIAC CATHETERIZATION  Result Date: 11/28/2020  Prox LAD to Mid LAD lesion is 90% stenosed.  1st Diag lesion is 80% stenosed.  1st Mrg lesion is 90% stenosed.  Mid Cx lesion is 100% stenosed.  Prox RCA lesion is 30% stenosed.  Prox RCA to Mid RCA lesion is 50% stenosed.  LV end diastolic pressure is mildly elevated.  1. 3 vessel obstructive CAD    - 90%  proximal to mid LAD, 80% first diagonal    - 90% large OM1, 100% LCX post OM1 with right to left collaterals.    - Patent stent in the proximal RCA. 50% mid vessel 2. Mildly elevated LVEDP Plan: recommend CT surgery consult for CABG.   DG CHEST PORT 1 VIEW  Result Date: 11/28/2020 CLINICAL DATA:  Chest pain. EXAM: PORTABLE CHEST 1 VIEW COMPARISON:  03/01/2018 FINDINGS: Lower lung volumes from prior exam. Upper normal heart size is likely accentuated by technique. Bronchovascular crowding versus vascular congestion. Subsegmental left basilar atelectasis. Biapical pleuroparenchymal scarring. No pneumothorax or significant effusion. No acute osseous abnormalities are seen. IMPRESSION: Lower lung volumes from prior exam. Upper normal heart size with bronchovascular crowding versus vascular congestion. Subsegmental left basilar atelectasis. Electronically Signed   By: Keith Rake M.D.   On: 11/28/2020 19:12    ECHOCARDIOGRAM COMPLETE  Result Date: 11/28/2020    ECHOCARDIOGRAM REPORT   Patient Name:   Krystal Mcpherson Date of Exam: 11/28/2020 Medical Rec #:  [id number]   Height:       66.0 in Accession #:    [id number]  Weight:       195.0 lb Date of Birth:  Oct 03, 1940  BSA:          1.978 m Patient Age:    80 years    BP:           128/63 mmHg Patient Gender: F           HR:           53 bpm. Exam Location:  Inpatient Procedure: 2D Echo Indications:    NSTEMI  History:        Patient has no prior history of Echocardiogram examinations.  Sonographer:    Johny Chess Referring Phys: [id number] [place]  1. Left ventricular ejection fraction, by estimation, is 55 to 60%. The left ventricle has normal function. The left ventricle demonstrates regional wall motion abnormalities (see scoring diagram/findings for description). Left ventricular diastolic parameters are consistent with Grade I diastolic dysfunction (impaired relaxation).  2. Right ventricular systolic function is normal. The right ventricular size is normal. There is normal pulmonary artery systolic pressure. The estimated right ventricular systolic pressure is 23.5 mmHg.  3. The mitral valve is grossly normal. Trivial mitral valve regurgitation. No evidence of mitral stenosis.  4. The aortic valve is tricuspid. Aortic valve regurgitation is not visualized. No aortic stenosis is present.  5. The inferior vena cava is normal in size with greater than 50% respiratory variability, suggesting right atrial pressure of 3 mmHg. FINDINGS  Left Ventricle: Left ventricular ejection fraction, by estimation, is 55 to 60%. The left ventricle has normal function. The left ventricle demonstrates regional wall motion abnormalities. The left ventricular internal cavity size was normal in size. There is no left ventricular hypertrophy. Left ventricular diastolic parameters are consistent with Grade I diastolic dysfunction (impaired relaxation).  LV Wall  Scoring: The posterior wall is hypokinetic. Right Ventricle: The right ventricular size is normal. No increase in right ventricular wall thickness. Right ventricular systolic function is normal. There is normal pulmonary artery systolic pressure. The tricuspid regurgitant velocity is 2.25 m/s, and  with an assumed right atrial pressure of 3 mmHg, the estimated right ventricular systolic pressure is 57.3 mmHg. Left Atrium: Left atrial size was normal in size. Right Atrium: Right atrial size was normal in size. Pericardium: Trivial pericardial effusion is present. Mitral Valve: The mitral valve is grossly normal.  Trivial mitral valve regurgitation. No evidence of mitral valve stenosis. Tricuspid Valve: The tricuspid valve is grossly normal. Tricuspid valve regurgitation is trivial. No evidence of tricuspid stenosis. Aortic Valve: The aortic valve is tricuspid. Aortic valve regurgitation is not visualized. No aortic stenosis is present. Pulmonic Valve: The pulmonic valve was grossly normal. Pulmonic valve regurgitation is trivial. No evidence of pulmonic stenosis. Aorta: The aortic root and ascending aorta are structurally normal, with no evidence of dilitation. Venous: The right upper pulmonary vein is normal. The inferior vena cava is normal in size with greater than 50% respiratory variability, suggesting right atrial pressure of 3 mmHg. IAS/Shunts: The interatrial septum appears to be lipomatous. The atrial septum is grossly normal.  LEFT VENTRICLE PLAX 2D LVIDd:         4.30 cm  Diastology LVIDs:         3.10 cm  LV e' medial:    5.44 cm/s LV PW:         1.10 cm  LV E/e' medial:  13.9 LV IVS:        1.00 cm  LV e' lateral:   5.87 cm/s LVOT diam:     1.90 cm  LV E/e' lateral: 12.8 LV SV:         65 LV SV Index:   33 LVOT Area:     2.84 cm  RIGHT VENTRICLE             IVC RV S prime:     13.20 cm/s  IVC diam: 1.10 cm TAPSE (M-mode): 2.0 cm LEFT ATRIUM             Index       RIGHT ATRIUM           Index LA diam:         3.80 cm 1.92 cm/m  RA Area:     14.20 cm LA Vol (A2C):   42.6 ml 21.53 ml/m RA Volume:   31.90 ml  16.12 ml/m LA Vol (A4C):   37.4 ml 18.90 ml/m LA Biplane Vol: 40.3 ml 20.37 ml/m  AORTIC VALVE LVOT Vmax:   94.30 cm/s LVOT Vmean:  60.000 cm/s LVOT VTI:    0.229 m  AORTA Ao Root diam: 2.70 cm Ao Asc diam:  3.00 cm MITRAL VALVE                TRICUSPID VALVE MV Area (PHT): 2.14 cm     TR Peak grad:   20.2 mmHg MV Decel Time: 354 msec     TR Vmax:        225.00 cm/s MV E velocity: 75.40 cm/s MV A velocity: 106.00 cm/s  SHUNTS MV E/A ratio:  0.71         Systemic VTI:  0.23 m                             Systemic Diam: 1.90 cm Eleonore Chiquito MD Electronically signed by Eleonore Chiquito MD Signature Date/Time: 11/28/2020/5:11:17 PM    Final     Cardiac Studies   Lexiscan Myoview 08/05/2017:  Nuclear stress EF: 62%.  No T wave inversion was noted during stress.  There was no ST segment deviation noted during stress.  This is a low risk study.  Normal perfusion. LVEF 62% with normal wall motion. This is a low risk study. _______________  Monitor 10/31/2018 to 11/14/2018:  Brief, less than 5 beat runs of supraventricular  tachycardia, fastest at 160 bpm, appears to be paroxysmal atrial tachycardia.  No evidence of atrial fibrillation or flutter.  No significant pauses noted.  Occasionally symptoms of heart fluttering are associated with the short SVT lasting 4-5 beats.  Occasional heart fluttering or skipping could be associated with the short-lived SVT episodes. These could be conservatively with no further medication as they should be benign. However, if desired, we can increase the atenolol from 12.5 mg once a day to 25 mg a day. This may help calm down some of these skipped beats. _______________  Lower Extremity Ultrasound 11/01/2020: Summary:  RIGHT:  - Findings consistent with acute deep vein thrombosis involving the right  popliteal vein, and right posterior tibial veins.   - No cystic structure found in the popliteal fossa.    LEFT:  - No evidence of deep vein thrombosis in the lower extremity. No indirect  evidence of obstruction proximal to the inguinal ligament.  - No cystic structure found in the popliteal fossa.   Cardiac cath 11/28/20   Prox LAD to Mid LAD lesion is 90% stenosed.  1st Diag lesion is 80% stenosed.  1st Mrg lesion is 90% stenosed.  Mid Cx lesion is 100% stenosed.  Prox RCA lesion is 30% stenosed.  Prox RCA to Mid RCA lesion is 50% stenosed.  LV end diastolic pressure is mildly elevated.   1. 3 vessel obstructive CAD    - 90% proximal to mid LAD, 80% first diagonal    - 90% large OM1, 100% LCX post OM1 with right to left collaterals.    - Patent stent in the proximal RCA. 50% mid vessel 2. Mildly elevated LVEDP  Plan: recommend CT surgery consult for CABG.    Echo 08/31/20 1. Left ventricular ejection fraction, by estimation, is 55 to 60%. The  left ventricle has normal function. The left ventricle demonstrates  regional wall motion abnormalities (see scoring diagram/findings for  description). Left ventricular diastolic  parameters are consistent with Grade I diastolic dysfunction (impaired  relaxation).  2. Right ventricular systolic function is normal. The right ventricular  size is normal. There is normal pulmonary artery systolic pressure. The  estimated right ventricular systolic pressure is 08.1 mmHg.  3. The mitral valve is grossly normal. Trivial mitral valve  regurgitation. No evidence of mitral stenosis.  4. The aortic valve is tricuspid. Aortic valve regurgitation is not  visualized. No aortic stenosis is present.  5. The inferior vena cava is normal in size with greater than 50%  respiratory variability, suggesting right atrial pressure of 3 mmHg.   Patient Profile     80 y.o. female with h/o CAD with prior RCA stneting in 05/2011, low risk Myoview in 2016, palpitations with brief runs of SVT  noted on monitor in 2020, DVT diagnosed on 11/01/2020 on Eliquis, HTN, HLD not on statin, GERD, arthritis, rosacea on doxyxycline, migraines, and Raynaud's syndrome who was admitted with NSTEMI.  Assessment & Plan    NSTEMI H/o of CAD - H/o CAD with prior stenting to RCA in 012 who presented with chest pain found to have elevated troponin to 3,000 andsome ischemic EKG changes with minor ST depressions. -  IV heparin  - Cath showed 3V CAD and recommended CT surgery consult for CABG - Echo showed LVEF 55-60%, G1DD, trivial MR - Aspirin 81mg  daily - No BB with bradycardia, heart rate around 60bpm - intolerant to statins in the past, LDL 94 - ARB held for cath - cath site, right radial  without complications, labs stable - Some chest pain twinges overnight on IV Nitro  HTN - atenolol held for bradycardia - home olmesartan and HCTZ held for cath - Pressures reasonable  HLD - h/o intolerance to statins - LDL 94, goal<70 - can consider PCSK9i as OP  DVT - recently diagnosed with DVT 11/01/2020 and was on Eliquis 5mg  BID - IV heparin  Rosacea - Doxycycline 50mg  daily  For questions or updates, please contact [facility] Please consult [email or web address] for contact info under        Signed, Saralee Bolick Ninfa Meeker, PA-C  11/29/2020, 7:36 AM

## 2020-11-29 NOTE — Progress Notes (Signed)
Krystal Mcpherson for Heparin Indication: 3VCAD and recent DVT  Allergies  Allergen Reactions  . Metronidazole Hives  . Atorvastatin     Muscle pain   . Ciprofloxacin Hives  . Fentanyl Hives    Itching all over   . Morphine And Related     Hallucinations   . Tetanus Toxoids     Localized knot   . Augmentin [Amoxicillin-Pot Clavulanate] Nausea And Vomiting    Pt has taken Keflex (cephalexin) without adverse reaction. Started 08/06/16    Patient Measurements: Height: 5\' 6"  (167.6 cm) Weight: 88.5 kg (195 lb) IBW/kg (Calculated) : 59.3 Heparin Dosing Weight:  80kg  Vital Signs: Temp: 98.5 F (36.9 C) (05/27 0410) Temp Source: Oral (05/27 0410) BP: 117/48 (05/27 0410) Pulse Rate: 62 (05/27 0410)  Labs: Recent Labs    11/28/20 4765 11/28/20 0716 11/28/20 0819 11/29/20 0609  HGB 12.9  --   --  11.9*  HCT 39.5  --   --  35.9*  PLT 227  --   --  200  APTT  --  31  --  88*  HEPARINUNFRC  --  >1.10*  --  1.05*  CREATININE 1.16*  --   --  1.08*  TROPONINIHS 1,622*  --  3,242*  --     Estimated Creatinine Clearance: 48.9 mL/min (A) (by C-G formula based on SCr of 1.08 mg/dL (H)).   Medical History: Past Medical History:  Diagnosis Date  . Allergy   . Anemia    Hx  . Anginal pain ([place])   . Arthritis    neck, lower back  . Asthma   . Change in bowel habits   . Chronic kidney disease   . Complication of anesthesia   . Coronary artery disease   . Dyspnea   . GERD (gastroesophageal reflux disease)   . HLD (hyperlipidemia)    hx - no med  . HTN (hypertension)   . Migraine   . Myocardial infarction ([place])   . Pneumonia   . PONV (postoperative nausea and vomiting)   . Stented coronary artery    right, no problems  . SVD (spontaneous vaginal delivery)    x 2  . Vitamin B12 deficiency    Assessment: Krystal Mcpherson admitted with CP found to have mvCAD. CABG planned next week. Pt on apixaban PTA for recent DVT (10/2020), now on IV  heparin perioperatively.  Heparin level elevated by DOAC, aPTT is therapeutic at 88 seconds, CBC stable.   Goal of Therapy:  Heparin level 0.3-0.7 units/ml  APTT 66-102 seconds Monitor platelets by anticoagulation protocol: Yes   Plan:  Continue heparin 1300 units/h Daily heparin level and aPTT until correlating Watch CBC and S/Sx bleeding   [name], PharmD, BCPS, [facility] Clinical Pharmacist [phone] Please check AMION for all [place] numbers 11/29/2020

## 2020-11-30 DIAGNOSIS — I25110 Atherosclerotic heart disease of native coronary artery with unstable angina pectoris: Secondary | ICD-10-CM | POA: Diagnosis not present

## 2020-11-30 DIAGNOSIS — I214 Non-ST elevation (NSTEMI) myocardial infarction: Secondary | ICD-10-CM | POA: Diagnosis not present

## 2020-11-30 DIAGNOSIS — E7800 Pure hypercholesterolemia, unspecified: Secondary | ICD-10-CM | POA: Diagnosis not present

## 2020-11-30 DIAGNOSIS — I10 Essential (primary) hypertension: Secondary | ICD-10-CM | POA: Diagnosis not present

## 2020-11-30 LAB — CBC
HCT: 36.2 % (ref 36.0–46.0)
Hemoglobin: 12 g/dL (ref 12.0–15.0)
MCH: 27.6 pg (ref 26.0–34.0)
MCHC: 33.1 g/dL (ref 30.0–36.0)
MCV: 83.2 fL (ref 80.0–100.0)
Platelets: 192 10*3/uL (ref 150–400)
RBC: 4.35 MIL/uL (ref 3.87–5.11)
RDW: 13.2 % (ref 11.5–15.5)
WBC: 8.5 10*3/uL (ref 4.0–10.5)
nRBC: 0 % (ref 0.0–0.2)

## 2020-11-30 LAB — HEPARIN LEVEL (UNFRACTIONATED): Heparin Unfractionated: 0.77 IU/mL — ABNORMAL HIGH (ref 0.30–0.70)

## 2020-11-30 LAB — BASIC METABOLIC PANEL
Anion gap: 6 (ref 5–15)
BUN: 16 mg/dL (ref 8–23)
CO2: 25 mmol/L (ref 22–32)
Calcium: 8.8 mg/dL — ABNORMAL LOW (ref 8.9–10.3)
Chloride: 104 mmol/L (ref 98–111)
Creatinine, Ser: 1.17 mg/dL — ABNORMAL HIGH (ref 0.44–1.00)
GFR, Estimated: 48 mL/min — ABNORMAL LOW (ref >60)
Glucose, Bld: 133 mg/dL — ABNORMAL HIGH (ref 70–99)
Potassium: 3.6 mmol/L (ref 3.5–5.1)
Sodium: 135 mmol/L (ref 135–145)

## 2020-11-30 LAB — APTT: aPTT: 71 seconds — ABNORMAL HIGH (ref 24–36)

## 2020-11-30 MED ORDER — MAGNESIUM HYDROXIDE 400 MG/5ML PO SUSP
30.0000 mL | Freq: Every day | ORAL | Status: DC | PRN
  Administered 2020-11-30: 30 mL via ORAL
  Filled 2020-11-30: qty 30

## 2020-11-30 NOTE — Progress Notes (Signed)
Nurse provided update of overnight events where low dose NTG was stopped due to soft BP. Nurse wanted to give update that patient has had 2 more episodes of very transient/fleeting chest pains, there and gone before any intervention is even needed. This seems consistent with the attestation yesterday. Nurse wanted to bring this to rounding MD's attention to perhaps reconsider the low dose NTG so I will let Dr. Arlan Organ know. Also requested updated EKG this AM.

## 2020-11-30 NOTE — Progress Notes (Addendum)
Patient had 13 beats SVT at 1405.  Dr. Harrell Gave on department, looked at telemetry.  Patient asymptomatic. No new orders recieved

## 2020-11-30 NOTE — Progress Notes (Signed)
[place] for Heparin Indication: 3VCAD and recent DVT  Allergies  Allergen Reactions  . Metronidazole Hives  . Atorvastatin     Muscle pain   . Ciprofloxacin Hives  . Fentanyl Hives    Itching all over   . Morphine And Related     Hallucinations   . Tetanus Toxoids     Localized knot   . Augmentin [Amoxicillin-Pot Clavulanate] Nausea And Vomiting    Pt has taken Keflex (cephalexin) without adverse reaction. Started 08/06/16    Patient Measurements: Height: 5\' 6"  (167.6 cm) Weight: 88.5 kg (195 lb) IBW/kg (Calculated) : 59.3 kg Heparin Dosing Weight: 80 kg  Vital Signs: Temp: 98.5 F (36.9 C) (05/28 0442) Temp Source: Oral (05/28 0442) BP: 114/48 (05/28 0710) Pulse Rate: 65 (05/28 0442)  Labs: Recent Labs    11/28/20 2725 11/28/20 0716 11/28/20 0819 11/29/20 0609 11/30/20 0139  HGB 12.9  --   --  11.9* 12.0  HCT 39.5  --   --  35.9* 36.2  PLT 227  --   --  200 192  APTT  --  31  --  88* 71*  HEPARINUNFRC  --  >1.10*  --  1.05* 0.77*  CREATININE 1.16*  --   --  1.08* 1.17*  TROPONINIHS 1,622*  --  3,242*  --   --     Estimated Creatinine Clearance: 45.1 mL/min (A) (by C-G formula based on SCr of 1.17 mg/dL (H)).   Medical History: Past Medical History:  Diagnosis Date  . Allergy   . Anemia    Hx  . Anginal pain ([place])   . Arthritis    neck, lower back  . Asthma   . Change in bowel habits   . Chronic kidney disease   . Complication of anesthesia   . Coronary artery disease   . Dyspnea   . GERD (gastroesophageal reflux disease)   . HLD (hyperlipidemia)    hx - no med  . HTN (hypertension)   . Migraine   . Myocardial infarction ([place])   . Pneumonia   . PONV (postoperative nausea and vomiting)   . Stented coronary artery    right, no problems  . SVD (spontaneous vaginal delivery)    x 2  . Vitamin B12 deficiency    Assessment: 81 yo female admitted with chest pain found to have mvCAD. PTA the patient  is on apixaban for recent DVT in 10/2020. The patient is planned for CABG. Pharmacy is consulted to dose heparin.  aPTT is therapeutic at 71 while running at 1300 units/hr. Will monitor therapy using heparin levels until the heparin levels begin to correlate with the aPTTs. Per the RN, the patient is without signs or symptoms of bleeding and there have been issues with the infusion. CBC is WNL and stable.  Goal of Therapy:  Heparin level 0.3-0.7 units/ml  APTT 66-102 seconds Monitor platelets by anticoagulation protocol: Yes   Plan:  Continue heparin IV at 1300 units/hr Obtain daily heparin level and aPTT Monitor for signs and symptoms of bleeding   Shauna Hugh, PharmD, [place]  PGY-1 Pharmacy Resident 11/30/2020 7:25 AM  Please check [email or web address] for unit-specific pharmacy phone numbers.

## 2020-11-30 NOTE — Progress Notes (Addendum)
Pt BP 95/46 MAP 61, IV Nitro @ 5 mcg. Pt chest pain free, complaints of headache. Clayton Bibles, MD made aware. Verbal order to discontinue Nitro. Will continue to monitor.   Elaina Hoops, RN

## 2020-11-30 NOTE — Progress Notes (Signed)
Progress Note  Patient Name: Krystal Mcpherson Date of Encounter: 11/30/2020  Primary Cardiologist: Candee Furbish, MD  Subjective   See previous documentation note. NTG drip stopped overnight 2/2 soft BP in 90s. 2 fleeting episodes of CP noted this AM, resolving quickly without intervention. Feeling better this afternoon. Doesn't feel like eating lunch yet. Last BP 115/50.  Inpatient Medications    Scheduled Meds: . aspirin EC  81 mg Oral Daily  . loratadine  10 mg Oral Daily  . rosuvastatin  5 mg Oral Daily  . sodium chloride flush  3 mL Intravenous Q12H  . topiramate  50 mg Oral Q2200   Continuous Infusions: . sodium chloride    . heparin 1,300 Units/hr (11/29/20 1844)   PRN Meds: sodium chloride, acetaminophen, albuterol, nitroGLYCERIN, ondansetron (ZOFRAN) IV, sodium chloride flush   Vital Signs    Vitals:   11/29/20 1436 11/29/20 2032 11/30/20 0442 11/30/20 0710  BP: (!) 142/48 (!) 134/50 (!) 95/46 (!) 114/48  Pulse: 62 65 65   Resp: 17 16 16    Temp: 98.4 F (36.9 C) 98.5 F (36.9 C) 98.5 F (36.9 C)   TempSrc: Oral Oral Oral   SpO2: 98% 99% 96%   Weight:      Height:        Intake/Output Summary (Last 24 hours) at 11/30/2020 1114 Last data filed at 11/30/2020 0942 Gross per 24 hour  Intake 449.93 ml  Output --  Net 449.93 ml   Last 3 Weights 11/28/2020 11/28/2020 05/24/2020  Weight (lbs) 195 lb 195 lb 201 lb  Weight (kg) 88.451 kg 88.451 kg 91.173 kg     Telemetry    NSR - Personally Reviewed  ECG    NSR 66bpm, nonspecific STTW changes with subtle ST depression I, II, avL, avF, V4-V6. Slightly more accentuated than last tracing but similar to admission tracing - Personally Reviewed  Physical Exam   GEN: No acute distress.  HEENT: Normocephalic, atraumatic, sclera non-icteric. Neck: No JVD or bruits. Cardiac: RRR no murmurs, rubs, or gallops.  Respiratory: Clear to auscultation bilaterally. Breathing is unlabored. GI: Soft, nontender, non-distended,  BS +x 4. MS: no deformity. Extremities: No clubbing or cyanosis. No edema. Distal pedal pulses are 2+ and equal bilaterally. Neuro:  AAOx3. Follows commands. Psych:  Responds to questions appropriately with a normal affect.  Labs    High Sensitivity Troponin:   Recent Labs  Lab 11/28/20 0613 11/28/20 0819  TROPONINIHS 1,622* 3,242*      Cardiac EnzymesNo results for input(s): TROPONINI in the last 168 hours. No results for input(s): TROPIPOC in the last 168 hours.   Chemistry Recent Labs  Lab 11/28/20 0613 11/29/20 0609 11/30/20 0139  NA 136 136 135  K 3.4* 3.7 3.6  CL 99 104 104  CO2 27 26 25   GLUCOSE 107* 129* 133*  BUN 22 15 16   CREATININE 1.16* 1.08* 1.17*  CALCIUM 9.7 9.5 8.8*  GFRNONAA 49* 53* 48*  ANIONGAP 10 6 6      Hematology Recent Labs  Lab 11/28/20 0613 11/29/20 0609 11/30/20 0139  WBC 6.9 8.6 8.5  RBC 4.66 4.36 4.35  HGB 12.9 11.9* 12.0  HCT 39.5 35.9* 36.2  MCV 84.8 82.3 83.2  MCH 27.7 27.3 27.6  MCHC 32.7 33.1 33.1  RDW 13.2 13.1 13.2  PLT 227 200 192    BNPNo results for input(s): BNP, PROBNP in the last 168 hours.   DDimer  Recent Labs  Lab 11/28/20 [phone]  DDIMER 0.35  Radiology    CARDIAC CATHETERIZATION  Result Date: 11/28/2020  Prox LAD to Mid LAD lesion is 90% stenosed.  1st Diag lesion is 80% stenosed.  1st Mrg lesion is 90% stenosed.  Mid Cx lesion is 100% stenosed.  Prox RCA lesion is 30% stenosed.  Prox RCA to Mid RCA lesion is 50% stenosed.  LV end diastolic pressure is mildly elevated.  1. 3 vessel obstructive CAD    - 90% proximal to mid LAD, 80% first diagonal    - 90% large OM1, 100% LCX post OM1 with right to left collaterals.    - Patent stent in the proximal RCA. 50% mid vessel 2. Mildly elevated LVEDP Plan: recommend CT surgery consult for CABG.   DG CHEST PORT 1 VIEW  Result Date: 11/28/2020 CLINICAL DATA:  Chest pain. EXAM: PORTABLE CHEST 1 VIEW COMPARISON:  03/01/2018 FINDINGS: Lower lung volumes from  prior exam. Upper normal heart size is likely accentuated by technique. Bronchovascular crowding versus vascular congestion. Subsegmental left basilar atelectasis. Biapical pleuroparenchymal scarring. No pneumothorax or significant effusion. No acute osseous abnormalities are seen. IMPRESSION: Lower lung volumes from prior exam. Upper normal heart size with bronchovascular crowding versus vascular congestion. Subsegmental left basilar atelectasis. Electronically Signed   By: Keith Rake M.D.   On: 11/28/2020 19:12   ECHOCARDIOGRAM COMPLETE  Result Date: 11/28/2020    ECHOCARDIOGRAM REPORT   Patient Name:   Krystal Mcpherson Date of Exam: 11/28/2020 Medical Rec #:  [id number]   Height:       66.0 in Accession #:    [id number]  Weight:       195.0 lb Date of Birth:  06/05/1941  BSA:          1.978 m Patient Age:    80 years    BP:           128/63 mmHg Patient Gender: F           HR:           53 bpm. Exam Location:  Inpatient Procedure: 2D Echo Indications:    NSTEMI  History:        Patient has no prior history of Echocardiogram examinations.  Sonographer:    Johny Chess Referring Phys: [id number] [place]  1. Left ventricular ejection fraction, by estimation, is 55 to 60%. The left ventricle has normal function. The left ventricle demonstrates regional wall motion abnormalities (see scoring diagram/findings for description). Left ventricular diastolic parameters are consistent with Grade I diastolic dysfunction (impaired relaxation).  2. Right ventricular systolic function is normal. The right ventricular size is normal. There is normal pulmonary artery systolic pressure. The estimated right ventricular systolic pressure is 69.6 mmHg.  3. The mitral valve is grossly normal. Trivial mitral valve regurgitation. No evidence of mitral stenosis.  4. The aortic valve is tricuspid. Aortic valve regurgitation is not visualized. No aortic stenosis is present.  5. The inferior vena cava is normal in  size with greater than 50% respiratory variability, suggesting right atrial pressure of 3 mmHg. FINDINGS  Left Ventricle: Left ventricular ejection fraction, by estimation, is 55 to 60%. The left ventricle has normal function. The left ventricle demonstrates regional wall motion abnormalities. The left ventricular internal cavity size was normal in size. There is no left ventricular hypertrophy. Left ventricular diastolic parameters are consistent with Grade I diastolic dysfunction (impaired relaxation).  LV Wall Scoring: The posterior wall is hypokinetic. Right Ventricle: The right ventricular size is normal. No increase  in right ventricular wall thickness. Right ventricular systolic function is normal. There is normal pulmonary artery systolic pressure. The tricuspid regurgitant velocity is 2.25 m/s, and  with an assumed right atrial pressure of 3 mmHg, the estimated right ventricular systolic pressure is 66.5 mmHg. Left Atrium: Left atrial size was normal in size. Right Atrium: Right atrial size was normal in size. Pericardium: Trivial pericardial effusion is present. Mitral Valve: The mitral valve is grossly normal. Trivial mitral valve regurgitation. No evidence of mitral valve stenosis. Tricuspid Valve: The tricuspid valve is grossly normal. Tricuspid valve regurgitation is trivial. No evidence of tricuspid stenosis. Aortic Valve: The aortic valve is tricuspid. Aortic valve regurgitation is not visualized. No aortic stenosis is present. Pulmonic Valve: The pulmonic valve was grossly normal. Pulmonic valve regurgitation is trivial. No evidence of pulmonic stenosis. Aorta: The aortic root and ascending aorta are structurally normal, with no evidence of dilitation. Venous: The right upper pulmonary vein is normal. The inferior vena cava is normal in size with greater than 50% respiratory variability, suggesting right atrial pressure of 3 mmHg. IAS/Shunts: The interatrial septum appears to be lipomatous. The  atrial septum is grossly normal.  LEFT VENTRICLE PLAX 2D LVIDd:         4.30 cm  Diastology LVIDs:         3.10 cm  LV e' medial:    5.44 cm/s LV PW:         1.10 cm  LV E/e' medial:  13.9 LV IVS:        1.00 cm  LV e' lateral:   5.87 cm/s LVOT diam:     1.90 cm  LV E/e' lateral: 12.8 LV SV:         65 LV SV Index:   33 LVOT Area:     2.84 cm  RIGHT VENTRICLE             IVC RV S prime:     13.20 cm/s  IVC diam: 1.10 cm TAPSE (M-mode): 2.0 cm LEFT ATRIUM             Index       RIGHT ATRIUM           Index LA diam:        3.80 cm 1.92 cm/m  RA Area:     14.20 cm LA Vol (A2C):   42.6 ml 21.53 ml/m RA Volume:   31.90 ml  16.12 ml/m LA Vol (A4C):   37.4 ml 18.90 ml/m LA Biplane Vol: 40.3 ml 20.37 ml/m  AORTIC VALVE LVOT Vmax:   94.30 cm/s LVOT Vmean:  60.000 cm/s LVOT VTI:    0.229 m  AORTA Ao Root diam: 2.70 cm Ao Asc diam:  3.00 cm MITRAL VALVE                TRICUSPID VALVE MV Area (PHT): 2.14 cm     TR Peak grad:   20.2 mmHg MV Decel Time: 354 msec     TR Vmax:        225.00 cm/s MV E velocity: 75.40 cm/s MV A velocity: 106.00 cm/s  SHUNTS MV E/A ratio:  0.71         Systemic VTI:  0.23 m                             Systemic Diam: 1.90 cm Eleonore Chiquito MD Electronically signed by Eleonore Chiquito MD Signature Date/Time: 11/28/2020/5:11:17  PM    Final     Cardiac Studies   2D echo 11/28/20 1. Left ventricular ejection fraction, by estimation, is 55 to 60%. The  left ventricle has normal function. The left ventricle demonstrates  regional wall motion abnormalities (see scoring diagram/findings for  description). Left ventricular diastolic  parameters are consistent with Grade I diastolic dysfunction (impaired  relaxation).  2. Right ventricular systolic function is normal. The right ventricular  size is normal. There is normal pulmonary artery systolic pressure. The  estimated right ventricular systolic pressure is 10.6 mmHg.  3. The mitral valve is grossly normal. Trivial mitral valve   regurgitation. No evidence of mitral stenosis.  4. The aortic valve is tricuspid. Aortic valve regurgitation is not  visualized. No aortic stenosis is present.  5. The inferior vena cava is normal in size with greater than 50%  respiratory variability, suggesting right atrial pressure of 3 mmHg.   Cath 11/28/20    Prox LAD to Mid LAD lesion is 90% stenosed.  1st Diag lesion is 80% stenosed.  1st Mrg lesion is 90% stenosed.  Mid Cx lesion is 100% stenosed.  Prox RCA lesion is 30% stenosed.  Prox RCA to Mid RCA lesion is 50% stenosed.  LV end diastolic pressure is mildly elevated.   1. 3 vessel obstructive CAD    - 90% proximal to mid LAD, 80% first diagonal    - 90% large OM1, 100% LCX post OM1 with right to left collaterals.    - Patent stent in the proximal RCA. 50% mid vessel 2. Mildly elevated LVEDP  Plan: recommend CT surgery consult for CABG.     Patient Profile     80 y.o. female with CAD with prior stenting RCA in 05/2011 with low risk Myoview in 2016, palpitations with brief runs of SVT noted on monitor in 2020, DVT diagnosed on 11/01/2020 on Eliquis, hypertension, hyperlipidemia not on statin, GERD, arthritis, rosacea currently on Doxycycline, migraines, Chiari malformation s/p repair 2001, and Raynaud's syndrome. Presented to [facility] with stuttering CP/indigestion for several days, found to have NSTEMI and MVCAD awaiting CABG.  Assessment & Plan    1. CAD with prior stent to RCA 05/2020, here with NSTEMI - seen by CVTS with plan for CABG 12/04/20 - echo 11/28/20 EF normal 55-60%, grade 1 Dd, trivial MR - continue ASA 81mg  daily - atenolol 25mg  daily was held due to bradycardia HR 40s-50s on admission - HR now 60s, could consider trial of very low dose Toprol - previously intolerant of atorvastatin to trialed on rosuvastatin his admission - continue heparin per pharmacy - NTG drip stopped overnight due to hypotension - will review with MD  2. HTN with soft BP  this admission - home atenolol on hold due to bradycardia - home ARB + HCTZ held for cath - would not resume at this time due to soft BP - will update baseline TSH in AM  3. Hyperlipidemia - h/o intolerance to atorvastatin - started on low dose rosuvastatin this admission - If the patient is tolerating statin at time of follow-up appointment, would consider rechecking liver function/lipid panel in 6-8 weeks - check baseline LFTs in AM - if she shows intolerance to this, would evaluate as OP for PCSK9i  4. H/o recent DVT 11/01/20, on Eliquis prior to admission - on IV heparin at this time - anticipate she will need to restart [place] post CABG since DVT was recent - not tachycardic, tachypneic or hypoxic, no pleuritic CP or dyspnea  5. Rosacea - on doxycycline 50mg  daily  6. H/o migraines - on Topamax as OP (continued here), uses PRN Ubrelvy as OP  7. Suspect mild CKD stage IIIa by labs - prior baseline Cr 2018 1.05, with range 1.08-1.17 here - continue to follow  For questions or updates, please contact [place] Please consult [email or web address] for contact info under Cardiology/STEMI.  Signed, Charlie Pitter, PA-C 11/30/2020, 11:14 AM

## 2020-11-30 NOTE — Progress Notes (Signed)
Patient complained of fleeting CP episode x 2.  Melina Copa PA notified, order for EKG received.  EKG obtained.

## 2020-12-01 ENCOUNTER — Inpatient Hospital Stay (HOSPITAL_COMMUNITY): Payer: Medicare PPO

## 2020-12-01 DIAGNOSIS — I25110 Atherosclerotic heart disease of native coronary artery with unstable angina pectoris: Secondary | ICD-10-CM | POA: Diagnosis not present

## 2020-12-01 DIAGNOSIS — E7800 Pure hypercholesterolemia, unspecified: Secondary | ICD-10-CM | POA: Diagnosis not present

## 2020-12-01 DIAGNOSIS — Z01810 Encounter for preprocedural cardiovascular examination: Secondary | ICD-10-CM

## 2020-12-01 DIAGNOSIS — I214 Non-ST elevation (NSTEMI) myocardial infarction: Secondary | ICD-10-CM | POA: Diagnosis not present

## 2020-12-01 DIAGNOSIS — I10 Essential (primary) hypertension: Secondary | ICD-10-CM | POA: Diagnosis not present

## 2020-12-01 LAB — CBC
HCT: 34 % — ABNORMAL LOW (ref 36.0–46.0)
Hemoglobin: 11.1 g/dL — ABNORMAL LOW (ref 12.0–15.0)
MCH: 27.5 pg (ref 26.0–34.0)
MCHC: 32.6 g/dL (ref 30.0–36.0)
MCV: 84.4 fL (ref 80.0–100.0)
Platelets: 161 10*3/uL (ref 150–400)
RBC: 4.03 MIL/uL (ref 3.87–5.11)
RDW: 13.3 % (ref 11.5–15.5)
WBC: 7.5 10*3/uL (ref 4.0–10.5)
nRBC: 0 % (ref 0.0–0.2)

## 2020-12-01 LAB — HEPATIC FUNCTION PANEL
ALT: 18 U/L (ref 0–44)
AST: 35 U/L (ref 15–41)
Albumin: 2.7 g/dL — ABNORMAL LOW (ref 3.5–5.0)
Alkaline Phosphatase: 28 U/L — ABNORMAL LOW (ref 38–126)
Bilirubin, Direct: 0.1 mg/dL (ref 0.0–0.2)
Indirect Bilirubin: 0.4 mg/dL (ref 0.3–0.9)
Total Bilirubin: 0.5 mg/dL (ref 0.3–1.2)
Total Protein: 5.2 g/dL — ABNORMAL LOW (ref 6.5–8.1)

## 2020-12-01 LAB — BASIC METABOLIC PANEL
Anion gap: 8 (ref 5–15)
BUN: 15 mg/dL (ref 8–23)
CO2: 24 mmol/L (ref 22–32)
Calcium: 8.3 mg/dL — ABNORMAL LOW (ref 8.9–10.3)
Chloride: 104 mmol/L (ref 98–111)
Creatinine, Ser: 1.09 mg/dL — ABNORMAL HIGH (ref 0.44–1.00)
GFR, Estimated: 52 mL/min — ABNORMAL LOW (ref >60)
Glucose, Bld: 94 mg/dL (ref 70–99)
Potassium: 3.7 mmol/L (ref 3.5–5.1)
Sodium: 136 mmol/L (ref 135–145)

## 2020-12-01 LAB — MAGNESIUM: Magnesium: 2.1 mg/dL (ref 1.7–2.4)

## 2020-12-01 LAB — APTT: aPTT: 78 seconds — ABNORMAL HIGH (ref 24–36)

## 2020-12-01 LAB — HEPARIN LEVEL (UNFRACTIONATED): Heparin Unfractionated: 0.58 IU/mL (ref 0.30–0.70)

## 2020-12-01 MED ORDER — POTASSIUM CHLORIDE CRYS ER 20 MEQ PO TBCR
30.0000 meq | EXTENDED_RELEASE_TABLET | Freq: Once | ORAL | Status: AC
  Administered 2020-12-01: 30 meq via ORAL
  Filled 2020-12-01: qty 1

## 2020-12-01 MED ORDER — BISACODYL 5 MG PO TBEC
5.0000 mg | DELAYED_RELEASE_TABLET | Freq: Every day | ORAL | Status: DC | PRN
  Administered 2020-12-01: 5 mg via ORAL
  Filled 2020-12-01: qty 1

## 2020-12-01 NOTE — Progress Notes (Signed)
Patient complained of chest pain 5/10 with radiation to shoulder and head.  BP=134/57  4LO2 applied, EKG obtained and 1 Nitro given at 1412.   CP resolved.   Post Nitro BP= [id number]  Dr. Harrell Gave on department, came to assess patient.

## 2020-12-01 NOTE — Progress Notes (Signed)
[place] for Heparin Indication: 3VCAD and recent DVT  Allergies  Allergen Reactions  . Metronidazole Hives  . Atorvastatin     Muscle pain   . Ciprofloxacin Hives  . Fentanyl Hives    Itching all over   . Morphine And Related     Hallucinations   . Tetanus Toxoids     Localized knot   . Augmentin [Amoxicillin-Pot Clavulanate] Nausea And Vomiting    Pt has taken Keflex (cephalexin) without adverse reaction. Started 08/06/16    Patient Measurements: Height: 5\' 6"  (167.6 cm) Weight: 88.5 kg (195 lb) IBW/kg (Calculated) : 59.3 kg Heparin Dosing Weight: 80 kg  Vital Signs: Temp: 97.8 F (36.6 C) (05/29 0350) Temp Source: Oral (05/29 0350) BP: 136/53 (05/29 0350) Pulse Rate: 64 (05/29 0350)  Labs: Recent Labs    11/28/20 0819 11/29/20 0609 11/29/20 0609 11/30/20 0139 12/01/20 0322  HGB  --  11.9*   < > 12.0 11.1*  HCT  --  35.9*  --  36.2 34.0*  PLT  --  200  --  192 161  APTT  --  88*  --  71* 78*  HEPARINUNFRC  --  1.05*  --  0.77* 0.58  CREATININE  --  1.08*  --  1.17* 1.09*  TROPONINIHS 3,242*  --   --   --   --    < > = values in this interval not displayed.    Estimated Creatinine Clearance: 48.4 mL/min (A) (by C-G formula based on SCr of 1.09 mg/dL (H)).   Medical History: Past Medical History:  Diagnosis Date  . Allergy   . Anemia    Hx  . Anginal pain ([place])   . Arthritis    neck, lower back  . Asthma   . Change in bowel habits   . Chronic kidney disease   . Complication of anesthesia   . Coronary artery disease   . Dyspnea   . GERD (gastroesophageal reflux disease)   . HLD (hyperlipidemia)    hx - no med  . HTN (hypertension)   . Migraine   . Myocardial infarction ([place])   . Pneumonia   . PONV (postoperative nausea and vomiting)   . Stented coronary artery    right, no problems  . SVD (spontaneous vaginal delivery)    x 2  . Vitamin B12 deficiency    Assessment: 80 yo female admitted with  chest pain found to have mvCAD. PTA the patient is on apixaban for recent DVT in 10/2020. The patient is planned for CABG. Pharmacy is consulted to dose heparin.  aPTT is therapeutic at 78 while running at 1300 units/hr. The heparin level is also therapeutic at 0.58. It has been ~4 days since the patient's last apixaban dose and the heparin levels are now correlating with the aPTT. Therefore, will monitor therapy using heparin levels only. Per the RN, the patient is without signs or symptoms of bleeding and there have been issues with the infusion. Hgb 11.1, platelets 161.  Goal of Therapy:  Heparin level 0.3-0.7 units/ml  Monitor platelets by anticoagulation protocol: Yes   Plan:  -Continue heparin IV at 1300 units/hr -Obtain a daily heparin level and CBC -Monitor for signs and symptoms of bleeding   Shauna Hugh, PharmD, [place]  PGY-1 Pharmacy Resident 12/01/2020 7:33 AM  Please check [email or web address] for unit-specific pharmacy phone numbers.

## 2020-12-01 NOTE — Progress Notes (Signed)
Pre-CABG exams and vein mapping have been completed.   Results can be found under chart review under CV PROC. 12/01/2020 3:03 PM Joesph Marcy RVT, RDMS

## 2020-12-01 NOTE — Progress Notes (Addendum)
Progress Note  Patient Name: Krystal Mcpherson Date of Encounter: 12/01/2020  Primary Cardiologist: Candee Furbish, MD  Subjective   No CP or SOB. Main complaint is constipation. Milk of mag and prune juice were ineffective. She is able to pass gas. Has to use laxatives fairly regularly at home (senna, dulocolax).  Inpatient Medications    Scheduled Meds: . aspirin EC  81 mg Oral Daily  . loratadine  10 mg Oral Daily  . rosuvastatin  5 mg Oral Daily  . sodium chloride flush  3 mL Intravenous Q12H  . topiramate  50 mg Oral Q2200   Continuous Infusions: . sodium chloride    . heparin 1,300 Units/hr (11/30/20 1519)   PRN Meds: sodium chloride, acetaminophen, albuterol, magnesium hydroxide, nitroGLYCERIN, ondansetron (ZOFRAN) IV, sodium chloride flush   Vital Signs    Vitals:   11/30/20 0710 11/30/20 1201 11/30/20 2022 12/01/20 0350  BP: (!) 114/48 (!) 129/48 (!) 136/54 (!) 136/53  Pulse:  62 72 64  Resp:  18 19 18   Temp:  98 F (36.7 C) 98.1 F (36.7 C) 97.8 F (36.6 C)  TempSrc:  Oral Oral Oral  SpO2:  98% 99% 95%  Weight:      Height:        Intake/Output Summary (Last 24 hours) at 12/01/2020 0830 Last data filed at 12/01/2020 0400 Gross per 24 hour  Intake 1260 ml  Output 1300 ml  Net -40 ml   Last 3 Weights 11/28/2020 11/28/2020 05/24/2020  Weight (lbs) 195 lb 195 lb 201 lb  Weight (kg) 88.451 kg 88.451 kg 91.173 kg     Telemetry    NSR, 14 beats SVT yesterday, 4 beats NSVT this AM - Personally Reviewed  Physical Exam   GEN: No acute distress.  HEENT: Normocephalic, atraumatic, sclera non-icteric. Neck: No JVD or bruits. Cardiac: RRR no murmurs, rubs, or gallops.  Respiratory: Clear to auscultation bilaterally. Breathing is unlabored. GI: Soft, nontender, non-distended, BS +x 4. MS: no deformity. Extremities: No clubbing or cyanosis. No edema. Distal pedal pulses are 2+ and equal bilaterally. Right wrist radial cath site without hematoma. Neuro:  AAOx3.  Follows commands. Psych:  Responds to questions appropriately with a normal affect.  Labs    High Sensitivity Troponin:   Recent Labs  Lab 11/28/20 0613 11/28/20 0819  TROPONINIHS 1,622* 3,242*      Cardiac EnzymesNo results for input(s): TROPONINI in the last 168 hours. No results for input(s): TROPIPOC in the last 168 hours.   Chemistry Recent Labs  Lab 11/29/20 0609 11/30/20 0139 12/01/20 0322  NA 136 135 136  K 3.7 3.6 3.7  CL 104 104 104  CO2 26 25 24   GLUCOSE 129* 133* 94  BUN 15 16 15   CREATININE 1.08* 1.17* 1.09*  CALCIUM 9.5 8.8* 8.3*  PROT  --   --  5.2*  ALBUMIN  --   --  2.7*  AST  --   --  35  ALT  --   --  18  ALKPHOS  --   --  28*  BILITOT  --   --  0.5  GFRNONAA 53* 48* 52*  ANIONGAP 6 6 8      Hematology Recent Labs  Lab 11/29/20 0609 11/30/20 0139 12/01/20 0322  WBC 8.6 8.5 7.5  RBC 4.36 4.35 4.03  HGB 11.9* 12.0 11.1*  HCT 35.9* 36.2 34.0*  MCV 82.3 83.2 84.4  MCH 27.3 27.6 27.5  MCHC 33.1 33.1 32.6  RDW 13.1 13.2  13.3  PLT 200 192 161    BNPNo results for input(s): BNP, PROBNP in the last 168 hours.   DDimer  Recent Labs  Lab 11/28/20 [phone]  DDIMER 0.35     Radiology    No results found.  Cardiac Studies   2D echo 11/28/20 1. Left ventricular ejection fraction, by estimation, is 55 to 60%. The  left ventricle has normal function. The left ventricle demonstrates  regional wall motion abnormalities (see scoring diagram/findings for  description). Left ventricular diastolic  parameters are consistent with Grade I diastolic dysfunction (impaired  relaxation).  2. Right ventricular systolic function is normal. The right ventricular  size is normal. There is normal pulmonary artery systolic pressure. The  estimated right ventricular systolic pressure is 36.6 mmHg.  3. The mitral valve is grossly normal. Trivial mitral valve  regurgitation. No evidence of mitral stenosis.  4. The aortic valve is tricuspid. Aortic valve  regurgitation is not  visualized. No aortic stenosis is present.  5. The inferior vena cava is normal in size with greater than 50%  respiratory variability, suggesting right atrial pressure of 3 mmHg.   Cath 11/28/20    Prox LAD to Mid LAD lesion is 90% stenosed.  1st Diag lesion is 80% stenosed.  1st Mrg lesion is 90% stenosed.  Mid Cx lesion is 100% stenosed.  Prox RCA lesion is 30% stenosed.  Prox RCA to Mid RCA lesion is 50% stenosed.  LV end diastolic pressure is mildly elevated.  1. 3 vessel obstructive CAD - 90% proximal to mid LAD, 80% first diagonal - 90% large OM1, 100% LCX post OM1 with right to left collaterals. - Patent stent in the proximal RCA. 50% mid vessel 2. Mildly elevated LVEDP  Plan: recommend CT surgery consult for CABG.    Patient Profile     80 y.o. female with CAD with prior stenting RCA in 05/2011 with low risk Myoview in 2016, palpitations with brief runs of SVT noted on monitor in 2020, DVT diagnosed on 11/01/2020 on Eliquis, hypertension, hyperlipidemia not on statin, GERD, arthritis, rosacea currently on Doxycycline, migraines, Chiari malformation s/p repair 2001, and Raynaud's syndrome. Presented to [facility] with stuttering CP/indigestion for several days, found to have NSTEMI and MVCAD awaiting CABG.  Assessment & Plan    1. CAD with prior stent to RCA 05/2020, here with NSTEMI - seen by CVTS with plan for CABG 12/04/20 - echo 11/28/20 EF normal 55-60%, grade 1 Dd, trivial MR - continue ASA 81mg  daily - atenolol 25mg  daily was held due to bradycardia HR 40s-50s on admission - HR trend 56-70 - previously intolerant of atorvastatin to trialed on rosuvastatin his admission - continue heparin per pharmacy - NTG drip stopped 2/2 hypotension  2. SVT/NSVT  - 14 beats SVT 5/28, 4 beats NSVT 5/29 - K 3.7 -> will give 64meq KCl now - add on Mag with care order to call if level is </= 1.9 - will await MD input whether trial of low dose  Toprol would be reasonable  3. HTN with soft BP this admission - home atenolol on hold due to bradycardia - home ARB + HCTZ held for cath and not restarted due to episodic soft BPs - TSH order somehow kicked out this AM, will re-order for AM  4. Hyperlipidemia - h/o intolerance to atorvastatin - started on low dose rosuvastatin this admission - If the patient is tolerating statin at time of follow-up appointment, would consider rechecking liver function/lipid panel in 6-8 weeks -  if she shows intolerance to this, would evaluate as OP for PCSK9i  5. H/o recent DVT 11/01/20, on Eliquis prior to admission - on IV heparin at this time - anticipate she will need to restart [place] post CABG since DVT was recent - not tachycardic, tachypneic or hypoxic, no pleuritic CP or dyspnea  6. Rosacea - on doxycycline 50mg  daily as outpatient - discontinued yesterday as patient stated only taking PRN. Pharmacy this AM clarified with patient she does take it regularly but just prefers not to take it while here  7. H/o migraines - on Topamax as OP (continued here), uses PRN Ubrelvy as OP  8. Suspect mild CKD stage IIIa by labs - prior baseline Cr 2018 1.05, with range 1.08-1.17 here - continue to follow  9. Constipation - MOM ineffective, will trial dulcolax PRN per patient's request  For questions or updates, please contact [place] Please consult [email or web address] for contact info under Cardiology/STEMI.  Signed, Charlie Pitter, PA-C 12/01/2020, 8:30 AM

## 2020-12-02 DIAGNOSIS — I2510 Atherosclerotic heart disease of native coronary artery without angina pectoris: Secondary | ICD-10-CM | POA: Diagnosis not present

## 2020-12-02 DIAGNOSIS — E782 Mixed hyperlipidemia: Secondary | ICD-10-CM

## 2020-12-02 DIAGNOSIS — I214 Non-ST elevation (NSTEMI) myocardial infarction: Secondary | ICD-10-CM | POA: Diagnosis not present

## 2020-12-02 DIAGNOSIS — I10 Essential (primary) hypertension: Secondary | ICD-10-CM | POA: Diagnosis not present

## 2020-12-02 LAB — URINALYSIS, ROUTINE W REFLEX MICROSCOPIC
Bilirubin Urine: NEGATIVE
Glucose, UA: NEGATIVE mg/dL
Hgb urine dipstick: NEGATIVE
Ketones, ur: NEGATIVE mg/dL
Nitrite: NEGATIVE
Protein, ur: NEGATIVE mg/dL
Specific Gravity, Urine: 1.008 (ref 1.005–1.030)
pH: 7 (ref 5.0–8.0)

## 2020-12-02 LAB — BASIC METABOLIC PANEL
Anion gap: 7 (ref 5–15)
BUN: 14 mg/dL (ref 8–23)
CO2: 23 mmol/L (ref 22–32)
Calcium: 8.6 mg/dL — ABNORMAL LOW (ref 8.9–10.3)
Chloride: 106 mmol/L (ref 98–111)
Creatinine, Ser: 1.06 mg/dL — ABNORMAL HIGH (ref 0.44–1.00)
GFR, Estimated: 54 mL/min — ABNORMAL LOW (ref >60)
Glucose, Bld: 105 mg/dL — ABNORMAL HIGH (ref 70–99)
Potassium: 4.1 mmol/L (ref 3.5–5.1)
Sodium: 136 mmol/L (ref 135–145)

## 2020-12-02 LAB — CBC
HCT: 33.7 % — ABNORMAL LOW (ref 36.0–46.0)
Hemoglobin: 11 g/dL — ABNORMAL LOW (ref 12.0–15.0)
MCH: 27.8 pg (ref 26.0–34.0)
MCHC: 32.6 g/dL (ref 30.0–36.0)
MCV: 85.1 fL (ref 80.0–100.0)
Platelets: 195 10*3/uL (ref 150–400)
RBC: 3.96 MIL/uL (ref 3.87–5.11)
RDW: 13.4 % (ref 11.5–15.5)
WBC: 7.5 10*3/uL (ref 4.0–10.5)
nRBC: 0 % (ref 0.0–0.2)

## 2020-12-02 LAB — TSH: TSH: 2.743 u[IU]/mL (ref 0.350–4.500)

## 2020-12-02 LAB — SURGICAL PCR SCREEN
MRSA, PCR: NEGATIVE
Staphylococcus aureus: NEGATIVE

## 2020-12-02 LAB — HEPARIN LEVEL (UNFRACTIONATED): Heparin Unfractionated: 0.48 IU/mL (ref 0.30–0.70)

## 2020-12-02 MED ORDER — MENTHOL 3 MG MT LOZG
1.0000 | LOZENGE | OROMUCOSAL | Status: DC | PRN
  Filled 2020-12-02: qty 9

## 2020-12-02 MED ORDER — METOPROLOL SUCCINATE ER 25 MG PO TB24
12.5000 mg | ORAL_TABLET | Freq: Every day | ORAL | Status: DC
  Administered 2020-12-02 – 2020-12-03 (×2): 12.5 mg via ORAL
  Filled 2020-12-02 (×2): qty 1

## 2020-12-02 NOTE — Progress Notes (Signed)
Pt voiced concern about BP's trending upward and home meds specifically Benicar being on hold and would like this addressed prior to surgery. SBP's ranging 120's to 150's. Jessie Foot, RN

## 2020-12-02 NOTE — Progress Notes (Addendum)
Progress Note  Patient Name: Krystal Mcpherson Date of Encounter: 12/02/2020  Primary Cardiologist: Candee Furbish, MD  Subjective   No chest pain overnight. 18 beat run of NSVT, asymptomatic. Plan for CABG on 6/1 with Dr. Julien Girt.  Inpatient Medications    Scheduled Meds: . aspirin EC  81 mg Oral Daily  . loratadine  10 mg Oral Daily  . rosuvastatin  5 mg Oral Daily  . sodium chloride flush  3 mL Intravenous Q12H  . topiramate  50 mg Oral Q2200   Continuous Infusions: . sodium chloride    . heparin 1,300 Units/hr (12/01/20 1231)   PRN Meds: sodium chloride, acetaminophen, albuterol, bisacodyl, magnesium hydroxide, nitroGLYCERIN, ondansetron (ZOFRAN) IV, sodium chloride flush   Vital Signs    Vitals:   12/01/20 0920 12/01/20 1400 12/01/20 1949 12/02/20 0604  BP: (!) 128/46 (!) 134/57 (!) 144/50 (!) 129/53  Pulse: 70 67 70 65  Resp:   16 15  Temp:  98.1 F (36.7 C) 98.1 F (36.7 C) 98.4 F (36.9 C)  TempSrc:  Oral Oral Oral  SpO2: 97% 100% 100% 98%  Weight:      Height:        Intake/Output Summary (Last 24 hours) at 12/02/2020 0746 Last data filed at 12/02/2020 8416 Gross per 24 hour  Intake 615.45 ml  Output 600 ml  Net 15.45 ml   Last 3 Weights 11/28/2020 11/28/2020 05/24/2020  Weight (lbs) 195 lb 195 lb 201 lb  Weight (kg) 88.451 kg 88.451 kg 91.173 kg     Telemetry    NSR, 16 beat run of NSVT - Personally Reviewed  Physical Exam   GEN: No acute distress.  HEENT: Normocephalic, atraumatic, sclera non-icteric. Neck: No JVD or bruits. Cardiac: RRR no murmurs, rubs, or gallops.  Respiratory: Clear to auscultation bilaterally. Breathing is unlabored. GI: Soft, nontender, non-distended, BS +x 4. MS: no deformity. Extremities: No clubbing or cyanosis. No edema. Distal pedal pulses are 2+ and equal bilaterally. Right wrist radial cath site without hematoma. Neuro:  AAOx3. Follows commands. Psych:  Responds to questions appropriately with a normal  affect.  Labs    High Sensitivity Troponin:   Recent Labs  Lab 11/28/20 0613 11/28/20 0819  TROPONINIHS 1,622* 3,242*      Cardiac EnzymesNo results for input(s): TROPONINI in the last 168 hours. No results for input(s): TROPIPOC in the last 168 hours.   Chemistry Recent Labs  Lab 11/30/20 0139 12/01/20 0322 12/02/20 0154  NA 135 136 136  K 3.6 3.7 4.1  CL 104 104 106  CO2 25 24 23   GLUCOSE 133* 94 105*  BUN 16 15 14   CREATININE 1.17* 1.09* 1.06*  CALCIUM 8.8* 8.3* 8.6*  PROT  --  5.2*  --   ALBUMIN  --  2.7*  --   AST  --  35  --   ALT  --  18  --   ALKPHOS  --  28*  --   BILITOT  --  0.5  --   GFRNONAA 48* 52* 54*  ANIONGAP 6 8 7      Hematology Recent Labs  Lab 11/30/20 0139 12/01/20 0322 12/02/20 0154  WBC 8.5 7.5 7.5  RBC 4.35 4.03 3.96  HGB 12.0 11.1* 11.0*  HCT 36.2 34.0* 33.7*  MCV 83.2 84.4 85.1  MCH 27.6 27.5 27.8  MCHC 33.1 32.6 32.6  RDW 13.2 13.3 13.4  PLT 192 161 195    BNPNo results for input(s): BNP, PROBNP in the  last 168 hours.   DDimer  Recent Labs  Lab 11/28/20 [phone]  DDIMER 0.35     Radiology    VAS [place] LOWER EXTREMITY SAPHENOUS VEIN MAPPING  Result Date: 12/01/2020 LOWER EXTREMITY VEIN MAPPING Patient Name:  Krystal Mcpherson  Date of Exam:   12/01/2020 Medical Rec #: [id number]    Accession #:    [id number] Date of Birth: [birth year]   Patient Gender: F Patient Age:   19Y Exam Location:  [facility] Procedure:      VAS [place] LOWER EXTREMITY SAPHENOUS VEIN MAPPING Referring Phys: [id number] [facility] G RODDENBERRY --------------------------------------------------------------------------------  Indications: Pre-op History:     Histpry of varicose veins with BLE vein stripping and sclerosing              agent as treatments.  Comparison Study: No previous exams Performing Technologist: Rogelia Rohrer  Examination Guidelines: A complete evaluation includes B-mode imaging, spectral Doppler, color Doppler, and power Doppler as needed of all accessible  portions of each vessel. Bilateral testing is considered an integral part of a complete examination. Limited examinations for reoccurring indications may be performed as noted. +---------------+-----------+----------------------+---------------+-----------+   RT Diameter  RT Findings         GSV            LT Diameter  LT Findings      (cm)                                            (cm)                  +---------------+-----------+----------------------+---------------+-----------+      0.49       branching     Saphenofemoral         0.52       branching                                   Junction                                  +---------------+-----------+----------------------+---------------+-----------+                               Proximal thigh         0.27                  +---------------+-----------+----------------------+---------------+-----------+                                 Mid thigh            0.20       branching  +---------------+-----------+----------------------+---------------+-----------+                                Distal thigh          0.13       branching  +---------------+-----------+----------------------+---------------+-----------+ Diagnosing physician: Monica Martinez MD Electronically signed by Monica Martinez MD on 12/01/2020 at 5:17:54 PM.    Final    VAS US DOPPLER PRE CABG  Result Date: 12/01/2020 PREOPERATIVE VASCULAR  EVALUATION Patient Name:  Krystal Mcpherson  Date of Exam:   12/01/2020 Medical Rec #: [id number]    Accession #:    [id number] Date of Birth: 1940/10/02   Patient Gender: F Patient Age:   79Y Exam Location:  [facility] Procedure:      VAS US DOPPLER PRE CABG Referring Phys: [id number] Elaine --------------------------------------------------------------------------------  Indications:      Pre-CABG. Risk Factors:     Hypertension, hyperlipidemia, no history of smoking, coronary                    artery disease. Other Factors:    HX of cardiac stents. Comparison Study: No previous exams Performing Technologist: Hill, Jody RVT, RDMS  Examination Guidelines: A complete evaluation includes B-mode imaging, spectral Doppler, color Doppler, and power Doppler as needed of all accessible portions of each vessel. Bilateral testing is considered an integral part of a complete examination. Limited examinations for reoccurring indications may be performed as noted.  Right Carotid Findings: +----------+--------+--------+--------+--------+------------------+           PSV cm/sEDV cm/sStenosisDescribeComments           +----------+--------+--------+--------+--------+------------------+ CCA Prox  67      10                                         +----------+--------+--------+--------+--------+------------------+ CCA Distal55      11                      intimal thickening +----------+--------+--------+--------+--------+------------------+ ICA Prox  52      14                      intimal thickening +----------+--------+--------+--------+--------+------------------+ ICA Distal95      27                                         +----------+--------+--------+--------+--------+------------------+ ECA       41      0                                          +----------+--------+--------+--------+--------+------------------+ +----------+--------+-------+----------------+------------+           PSV cm/sEDV cmsDescribe        Arm Pressure +----------+--------+-------+----------------+------------+ Subclavian83             Multiphasic, WNL             +----------+--------+-------+----------------+------------+ +---------+--------+--+--------+-+---------+ VertebralPSV cm/s36EDV cm/s8Antegrade +---------+--------+--+--------+-+---------+ Left Carotid Findings: +----------+--------+--------+--------+---------------------+------------------+           PSV cm/sEDV  cm/sStenosisDescribe             Comments           +----------+--------+--------+--------+---------------------+------------------+ CCA Prox  81      20                                                      +----------+--------+--------+--------+---------------------+------------------+ CCA Distal60      16  heterogenous, focal  intimal thickening                                   and smooth                              +----------+--------+--------+--------+---------------------+------------------+ ICA Prox  51      17              heterogenous and                                                          smooth                                  +----------+--------+--------+--------+---------------------+------------------+ ICA Distal79      25                                                      +----------+--------+--------+--------+---------------------+------------------+ ECA       43      0                                                       +----------+--------+--------+--------+---------------------+------------------+ +----------+--------+--------+----------------+------------+ SubclavianPSV cm/sEDV cm/sDescribe        Arm Pressure +----------+--------+--------+----------------+------------+           95      83      Multiphasic, WNL             +----------+--------+--------+----------------+------------+ +---------+--------+--+--------+--+---------+ VertebralPSV cm/s63EDV cm/s16Antegrade +---------+--------+--+--------+--+---------+  ABI Findings: +--------+------------------+-----+---------+--------+ Right   Rt Pressure (mmHg)IndexWaveform Comment  +--------+------------------+-----+---------+--------+ [id number]                    triphasic         +--------+------------------+-----+---------+--------+ PTA     134               1.02 triphasic          +--------+------------------+-----+---------+--------+ DP      131               1.00 triphasic         +--------+------------------+-----+---------+--------+ +--------+------------------+-----+---------+-------+ Left    Lt Pressure (mmHg)IndexWaveform Comment +--------+------------------+-----+---------+-------+ [id number]                    triphasic        +--------+------------------+-----+---------+-------+ PTA     138               1.05 triphasic        +--------+------------------+-----+---------+-------+ DP      137               1.05 triphasic        +--------+------------------+-----+---------+-------+ +-------+---------------+----------------+ ABI/TBIToday's ABI/TBIPrevious ABI/TBI +-------+---------------+----------------+ Right  1.02                            +-------+---------------+----------------+  Left   1.05                            +-------+---------------+----------------+  Right Doppler Findings: +--------+--------+-----+---------+-----------+ Site    PressureIndexDoppler  Comments    +--------+--------+-----+---------+-----------+ [id number]          triphasic            +--------+--------+-----+---------+-----------+ Radial               triphasicRecent cath +--------+--------+-----+---------+-----------+ Ulnar                triphasic            +--------+--------+-----+---------+-----------+  Left Doppler Findings: +--------+--------+-----+---------+--------+ Site    PressureIndexDoppler  Comments +--------+--------+-----+---------+--------+ [id number]          triphasic         +--------+--------+-----+---------+--------+ Radial               triphasic         +--------+--------+-----+---------+--------+ Ulnar                triphasic         +--------+--------+-----+---------+--------+  Summary: Right Carotid: The extracranial vessels were near-normal with only minimal wall                 thickening or plaque. Left Carotid: The extracranial vessels were near-normal with only minimal wall               thickening or plaque. Vertebrals:  Bilateral vertebral arteries demonstrate antegrade flow. Subclavians: Normal flow hemodynamics were seen in bilateral subclavian              arteries. Right ABI: Resting right ankle-brachial index is within normal range. No evidence of significant right lower extremity arterial disease. Left ABI: Resting left ankle-brachial index is within normal range. No evidence of significant left lower extremity arterial disease. Right Upper Extremity: Abnormal PPG waveforms with radial artery compression suggest an incomplete palmar arch. Doppler waveform obliterate with right radial compression. Doppler waveforms remain within normal limits with right ulnar compression. Left Upper Extremity: Normal PPG waveforms with radial artery compression suggest palmar arch patency. Doppler waveforms remain within normal limits with left radial compression. Doppler waveforms remain within normal limits with left ulnar compression.  Electronically signed by Monica Martinez MD on 12/01/2020 at 5:17:02 PM.    Final     Cardiac Studies   2D echo 11/28/20 1. Left ventricular ejection fraction, by estimation, is 55 to 60%. The  left ventricle has normal function. The left ventricle demonstrates  regional wall motion abnormalities (see scoring diagram/findings for  description). Left ventricular diastolic  parameters are consistent with Grade I diastolic dysfunction (impaired  relaxation).  2. Right ventricular systolic function is normal. The right ventricular  size is normal. There is normal pulmonary artery systolic pressure. The  estimated right ventricular systolic pressure is 98.9 mmHg.  3. The mitral valve is grossly normal. Trivial mitral valve  regurgitation. No evidence of mitral stenosis.  4. The aortic valve is tricuspid. Aortic valve regurgitation is not   visualized. No aortic stenosis is present.  5. The inferior vena cava is normal in size with greater than 50%  respiratory variability, suggesting right atrial pressure of 3 mmHg.   Cath 11/28/20    Prox LAD to Mid LAD lesion is 90% stenosed.  1st Diag lesion is 80% stenosed.  1st Mrg lesion is 90% stenosed.  Mid Cx lesion  is 100% stenosed.  Prox RCA lesion is 30% stenosed.  Prox RCA to Mid RCA lesion is 50% stenosed.  LV end diastolic pressure is mildly elevated.  1. 3 vessel obstructive CAD - 90% proximal to mid LAD, 80% first diagonal - 90% large OM1, 100% LCX post OM1 with right to left collaterals. - Patent stent in the proximal RCA. 50% mid vessel 2. Mildly elevated LVEDP  Plan: recommend CT surgery consult for CABG.    Patient Profile     80 y.o. female with CAD with prior stenting RCA in 05/2011 with low risk Myoview in 2016, palpitations with brief runs of SVT noted on monitor in 2020, DVT diagnosed on 11/01/2020 on Eliquis, hypertension, hyperlipidemia not on statin, GERD, arthritis, rosacea currently on Doxycycline, migraines, Chiari malformation s/p repair 2001, and Raynaud's syndrome. Presented to [facility] with stuttering CP/indigestion for several days, found to have NSTEMI and MVCAD awaiting CABG.  Assessment & Plan    1. CAD with prior stent to RCA 05/2020, here with NSTEMI - seen by CVTS with plan for CABG 12/04/20 - echo 11/28/20 EF normal 55-60%, grade 1 Dd, trivial MR - continue ASA 81mg  daily - atenolol 25mg  daily was held due to bradycardia HR 40s-50s on admission - HR trend 56-70 - previously intolerant of atorvastatin to trialed on rosuvastatin his admission - continue heparin per pharmacy - NTG drip stopped 2/2 hypotension  2. SVT/NSVT  - 14 beats SVT 5/28, 4 beats NSVT 5/29, 18 beat run of NSVT overnight - K 4.1 - Mg 2.1 - Start low dose Toprol XL 12.5 mg daily today  3. HTN with soft BP this admission - home atenolol on hold due  to bradycardia - home ARB + HCTZ held for cath and not restarted due to episodic soft BPs - TSH normal  4. Hyperlipidemia - h/o intolerance to atorvastatin - started on low dose rosuvastatin this admission - If the patient is tolerating statin at time of follow-up appointment, would consider rechecking liver function/lipid panel in 6-8 weeks - if she shows intolerance to this, would evaluate as OP for PCSK9i in the lipid clinic  5. H/o recent DVT 11/01/20, on Eliquis prior to admission - on IV heparin at this time - anticipate she will need to restart [facility] post CABG since DVT was recent - recent vein mapping did not visualize resolution of DVT - this is a concern for the patient  6. Rosacea - on doxycycline 50mg  daily as outpatient - discontinued yesterday as patient stated only taking PRN. Pharmacy this AM clarified with patient she does take it regularly but just prefers not to take it while here  7. H/o migraines - on Topamax as OP (continued here), uses PRN Ubrelvy as OP  8. Suspect mild CKD stage IIIa by labs - prior baseline Cr 2018 1.05, with range 1.08-1.17 here - continue to follow  9. Constipation - MOM ineffective, will trial dulcolax PRN per patient's request  For questions or updates, please contact [place] Please consult [email or web address] for contact info under Cardiology/STEMI.  Pixie Casino, MD, [facility], [place] Director of the Advanced Lipid Disorders &  Cardiovascular Risk Reduction Clinic Diplomate of the American Board of Clinical Lipidology Attending Cardiologist  Direct Dial: [phone]  Fax: [phone]  Website:  [email or web address]  Pixie Casino, MD 12/02/2020, 7:46 AM

## 2020-12-02 NOTE — Progress Notes (Signed)
[place] for Heparin Indication: 3VCAD and recent DVT  Allergies  Allergen Reactions  . Metronidazole Hives  . Atorvastatin     Muscle pain   . Ciprofloxacin Hives  . Fentanyl Hives    Itching all over   . Morphine And Related     Hallucinations   . Tetanus Toxoids     Localized knot   . Augmentin [Amoxicillin-Pot Clavulanate] Nausea And Vomiting    Pt has taken Keflex (cephalexin) without adverse reaction. Started 08/06/16    Patient Measurements: Height: 5\' 6"  (167.6 cm) Weight: 88.5 kg (195 lb) IBW/kg (Calculated) : 59.3 kg Heparin Dosing Weight: 80 kg  Vital Signs: Temp: 98.4 F (36.9 C) (05/30 0604) Temp Source: Oral (05/30 0604) BP: 129/53 (05/30 0604) Pulse Rate: 65 (05/30 0604)  Labs: Recent Labs    11/30/20 0139 12/01/20 0322 12/02/20 0154  HGB 12.0 11.1* 11.0*  HCT 36.2 34.0* 33.7*  PLT 192 161 195  APTT 71* 78*  --   HEPARINUNFRC 0.77* 0.58 0.48  CREATININE 1.17* 1.09* 1.06*    Estimated Creatinine Clearance: 49.8 mL/min (A) (by C-G formula based on SCr of 1.06 mg/dL (H)).   Medical History: Past Medical History:  Diagnosis Date  . Allergy   . Anemia    Hx  . Anginal pain ([place])   . Arthritis    neck, lower back  . Asthma   . Change in bowel habits   . Chronic kidney disease   . Complication of anesthesia   . Coronary artery disease   . Dyspnea   . GERD (gastroesophageal reflux disease)   . HLD (hyperlipidemia)    hx - no med  . HTN (hypertension)   . Migraine   . Myocardial infarction ([place])   . Pneumonia   . PONV (postoperative nausea and vomiting)   . Stented coronary artery    right, no problems  . SVD (spontaneous vaginal delivery)    x 2  . Vitamin B12 deficiency    Assessment: 80 yo female admitted with chest pain found to have mvCAD. PTA the patient is on apixaban for recent DVT in 10/2020. The patient is planned for CABG on Wednesday 6/1. Pharmacy is consulted to dose  heparin.  Heparin level today is therapeutic at 0.48 on 1300 units/hour. CBC is stable, no s/sx bleeding reported.  Goal of Therapy:  Heparin level 0.3-0.7 units/ml  Monitor platelets by anticoagulation protocol: Yes   Plan:  -Continue heparin IV at 1300 units/hr -Obtain a daily heparin level and CBC -Monitor for signs and symptoms of bleeding  Fara Olden, PharmD PGY-1 Pharmacy Resident 12/02/2020 7:16 AM Please see AMION for all pharmacy numbers

## 2020-12-02 NOTE — Progress Notes (Signed)
Patient had 18 beat run of VT, noted to be lying in bed.  Denies CP/SOB, BP 117/54, HR 62.  Dr. Sallyanne Kuster notified.

## 2020-12-03 ENCOUNTER — Inpatient Hospital Stay (HOSPITAL_COMMUNITY): Payer: Medicare PPO

## 2020-12-03 ENCOUNTER — Encounter (HOSPITAL_COMMUNITY): Payer: Self-pay | Admitting: Cardiology

## 2020-12-03 ENCOUNTER — Ambulatory Visit: Payer: Medicare PPO | Admitting: Cardiology

## 2020-12-03 DIAGNOSIS — Z86718 Personal history of other venous thrombosis and embolism: Secondary | ICD-10-CM

## 2020-12-03 DIAGNOSIS — I214 Non-ST elevation (NSTEMI) myocardial infarction: Secondary | ICD-10-CM | POA: Diagnosis not present

## 2020-12-03 DIAGNOSIS — I7300 Raynaud's syndrome without gangrene: Secondary | ICD-10-CM

## 2020-12-03 DIAGNOSIS — I2510 Atherosclerotic heart disease of native coronary artery without angina pectoris: Secondary | ICD-10-CM

## 2020-12-03 DIAGNOSIS — I872 Venous insufficiency (chronic) (peripheral): Secondary | ICD-10-CM

## 2020-12-03 LAB — CBC
HCT: 34.8 % — ABNORMAL LOW (ref 36.0–46.0)
Hemoglobin: 11.2 g/dL — ABNORMAL LOW (ref 12.0–15.0)
MCH: 27.7 pg (ref 26.0–34.0)
MCHC: 32.2 g/dL (ref 30.0–36.0)
MCV: 86.1 fL (ref 80.0–100.0)
Platelets: 197 10*3/uL (ref 150–400)
RBC: 4.04 MIL/uL (ref 3.87–5.11)
RDW: 13.4 % (ref 11.5–15.5)
WBC: 6.5 10*3/uL (ref 4.0–10.5)
nRBC: 0 % (ref 0.0–0.2)

## 2020-12-03 LAB — BASIC METABOLIC PANEL
Anion gap: 9 (ref 5–15)
BUN: 15 mg/dL (ref 8–23)
CO2: 21 mmol/L — ABNORMAL LOW (ref 22–32)
Calcium: 8.8 mg/dL — ABNORMAL LOW (ref 8.9–10.3)
Chloride: 108 mmol/L (ref 98–111)
Creatinine, Ser: 0.99 mg/dL (ref 0.44–1.00)
GFR, Estimated: 59 mL/min — ABNORMAL LOW (ref >60)
Glucose, Bld: 104 mg/dL — ABNORMAL HIGH (ref 70–99)
Potassium: 4.1 mmol/L (ref 3.5–5.1)
Sodium: 138 mmol/L (ref 135–145)

## 2020-12-03 LAB — PULMONARY FUNCTION TEST
FEF 25-75 Pre: 1.55 L/sec
FEF2575-%Pred-Pre: 92 %
FEV1-%Pred-Pre: 74 %
FEV1-Pre: 1.66 L
FEV1FVC-%Pred-Pre: 105 %
FEV6-%Pred-Pre: 73 %
FEV6-Pre: 2.08 L
FEV6FVC-%Pred-Pre: 103 %
FVC-%Pred-Pre: 70 %
FVC-Pre: 2.11 L
Pre FEV1/FVC ratio: 79 %
Pre FEV6/FVC Ratio: 99 %

## 2020-12-03 LAB — PROTIME-INR
INR: 1 (ref 0.8–1.2)
Prothrombin Time: 13.1 seconds (ref 11.4–15.2)

## 2020-12-03 LAB — HEPARIN LEVEL (UNFRACTIONATED): Heparin Unfractionated: 0.39 IU/mL (ref 0.30–0.70)

## 2020-12-03 LAB — ABO/RH: ABO/RH(D): AB POS

## 2020-12-03 MED ORDER — METOPROLOL TARTRATE 12.5 MG HALF TABLET
12.5000 mg | ORAL_TABLET | Freq: Once | ORAL | Status: AC
  Administered 2020-12-04: 12.5 mg via ORAL
  Filled 2020-12-03: qty 1

## 2020-12-03 MED ORDER — CEFAZOLIN SODIUM-DEXTROSE 2-4 GM/100ML-% IV SOLN
2.0000 g | INTRAVENOUS | Status: AC
  Administered 2020-12-04 (×2): 2 g via INTRAVENOUS
  Filled 2020-12-03: qty 100

## 2020-12-03 MED ORDER — CEFAZOLIN SODIUM-DEXTROSE 2-4 GM/100ML-% IV SOLN
2.0000 g | INTRAVENOUS | Status: DC
  Filled 2020-12-03: qty 100

## 2020-12-03 MED ORDER — TRANEXAMIC ACID (OHS) PUMP PRIME SOLUTION
2.0000 mg/kg | INTRAVENOUS | Status: DC
  Filled 2020-12-03: qty 1.77

## 2020-12-03 MED ORDER — TRANEXAMIC ACID 1000 MG/10ML IV SOLN
1.5000 mg/kg/h | INTRAVENOUS | Status: AC
  Administered 2020-12-04: 1.5 mg/kg/h via INTRAVENOUS
  Filled 2020-12-03: qty 20

## 2020-12-03 MED ORDER — NITROGLYCERIN IN D5W 200-5 MCG/ML-% IV SOLN
2.0000 ug/min | INTRAVENOUS | Status: AC
  Administered 2020-12-04: 10 ug/min via INTRAVENOUS
  Filled 2020-12-03: qty 250

## 2020-12-03 MED ORDER — VANCOMYCIN HCL 1500 MG/300ML IV SOLN
1500.0000 mg | INTRAVENOUS | Status: AC
  Administered 2020-12-04: 1500 mg via INTRAVENOUS
  Filled 2020-12-03: qty 300

## 2020-12-03 MED ORDER — TEMAZEPAM 15 MG PO CAPS
15.0000 mg | ORAL_CAPSULE | Freq: Once | ORAL | Status: AC | PRN
  Administered 2020-12-03: 15 mg via ORAL
  Filled 2020-12-03: qty 1

## 2020-12-03 MED ORDER — BISACODYL 5 MG PO TBEC
5.0000 mg | DELAYED_RELEASE_TABLET | Freq: Once | ORAL | Status: AC
  Administered 2020-12-03: 5 mg via ORAL
  Filled 2020-12-03: qty 1

## 2020-12-03 MED ORDER — SODIUM CHLORIDE 0.9 % IV SOLN
INTRAVENOUS | Status: DC
  Filled 2020-12-03: qty 30

## 2020-12-03 MED ORDER — INSULIN REGULAR(HUMAN) IN NACL 100-0.9 UT/100ML-% IV SOLN
INTRAVENOUS | Status: AC
  Administered 2020-12-04: .7 [IU]/h via INTRAVENOUS
  Filled 2020-12-03: qty 100

## 2020-12-03 MED ORDER — EPINEPHRINE HCL 5 MG/250ML IV SOLN IN NS
0.0000 ug/min | INTRAVENOUS | Status: AC
  Administered 2020-12-04: 1 ug/min via INTRAVENOUS
  Filled 2020-12-03: qty 250

## 2020-12-03 MED ORDER — CHLORHEXIDINE GLUCONATE CLOTH 2 % EX PADS: 6.0000 | MEDICATED_PAD | Freq: Once | CUTANEOUS | Status: DC

## 2020-12-03 MED ORDER — PHENYLEPHRINE HCL-NACL 20-0.9 MG/250ML-% IV SOLN
30.0000 ug/min | INTRAVENOUS | Status: AC
  Administered 2020-12-04: 20 ug/min via INTRAVENOUS
  Filled 2020-12-03: qty 250

## 2020-12-03 MED ORDER — DEXMEDETOMIDINE HCL IN NACL 400 MCG/100ML IV SOLN
0.1000 ug/kg/h | INTRAVENOUS | Status: AC
  Administered 2020-12-04: .7 ug/kg/h via INTRAVENOUS
  Filled 2020-12-03: qty 100

## 2020-12-03 MED ORDER — PLASMA-LYTE A IV SOLN
INTRAVENOUS | Status: DC
  Filled 2020-12-03: qty 1000

## 2020-12-03 MED ORDER — CHLORHEXIDINE GLUCONATE CLOTH 2 % EX PADS
6.0000 | MEDICATED_PAD | Freq: Once | CUTANEOUS | Status: AC
  Administered 2020-12-03: 6 via TOPICAL

## 2020-12-03 MED ORDER — NOREPINEPHRINE 4 MG/250ML-% IV SOLN
0.0000 ug/min | INTRAVENOUS | Status: DC
Start: 2020-12-04 — End: 2020-12-04
  Filled 2020-12-03: qty 250

## 2020-12-03 MED ORDER — TRANEXAMIC ACID (OHS) BOLUS VIA INFUSION
15.0000 mg/kg | INTRAVENOUS | Status: AC
  Administered 2020-12-04: 1327.5 mg via INTRAVENOUS
  Filled 2020-12-03: qty 1328

## 2020-12-03 MED ORDER — MAGNESIUM SULFATE 50 % IJ SOLN
40.0000 meq | INTRAMUSCULAR | Status: DC
  Filled 2020-12-03: qty 9.85

## 2020-12-03 MED ORDER — CHLORHEXIDINE GLUCONATE 0.12 % MT SOLN
15.0000 mL | Freq: Once | OROMUCOSAL | Status: AC
  Administered 2020-12-04: 15 mL via OROMUCOSAL
  Filled 2020-12-03: qty 15

## 2020-12-03 MED ORDER — MILRINONE LACTATE IN DEXTROSE 20-5 MG/100ML-% IV SOLN
0.3000 ug/kg/min | INTRAVENOUS | Status: DC
  Filled 2020-12-03: qty 100

## 2020-12-03 MED ORDER — POTASSIUM CHLORIDE 2 MEQ/ML IV SOLN
80.0000 meq | INTRAVENOUS | Status: DC
  Filled 2020-12-03: qty 40

## 2020-12-03 NOTE — Progress Notes (Signed)
TCTS Progress Notes  Pt seen and examined, chart and films independently reviewed. 80 yo lady with h/o CAD s/p PCI of RCA now admitted with NSTEMI and awaiting CABG. She has been treated for DVT with apixiban, hence the justified delay in surgery until tomorrow.   Upon review, the patient has no suitable venous conduit due to longstanding LE varicosities and s/p bilateral vein stripping. She carries a diagnosis of Raynaud's somewhat complicating the decision for radial artery harvesting. Appreciate Dr. Nicole Cella evaluation and assessment. Left radial artery harvesting seems reasonable, but it is unclear if her Raynaud sx would be worsened. The other option for conduit if RIMA harvesting with slightly increased risk for sternal wound healing difficulties in a nondiabetic.  She relates a history of intermittent/paroxysmal atrial fibrillation and has taken atenolol for years for "palpitations."  Plan CABG with possible left radial, possible BIMA, and possible modified Maze procedure including left atrial appendage clipping tomorrow.  She verbalizes understanding of this recommendation and wishes to proceed.   Krystal Mcpherson Z. Orvan Seen, [place]

## 2020-12-03 NOTE — Progress Notes (Addendum)
Progress Note  Patient Name: Krystal Mcpherson Date of Encounter: 12/03/2020  [facility] Cardiologist: Candee Furbish, MD   Subjective   Concerned about her BP. But now 001 systolic.  No chest pain and no SOB  Inpatient Medications    Scheduled Meds: . aspirin EC  81 mg Oral Daily  . loratadine  10 mg Oral Daily  . metoprolol succinate  12.5 mg Oral Daily  . rosuvastatin  5 mg Oral Daily  . sodium chloride flush  3 mL Intravenous Q12H  . topiramate  50 mg Oral Q2200   Continuous Infusions: . sodium chloride    . heparin 1,300 Units/hr (12/03/20 0459)   PRN Meds: sodium chloride, acetaminophen, albuterol, bisacodyl, magnesium hydroxide, menthol-cetylpyridinium, nitroGLYCERIN, ondansetron (ZOFRAN) IV, sodium chloride flush   Vital Signs    Vitals:   12/02/20 1515 12/02/20 2022 12/02/20 2118 12/03/20 0330  BP: (!) 143/54 (!) 150/56 (!) 138/48 (!) 146/62  Pulse: 66   80  Resp: 16 16  15   Temp: 98.4 F (36.9 C) 99 F (37.2 C)  98.4 F (36.9 C)  TempSrc: Oral Oral  Oral  SpO2:    98%  Weight:      Height:        Intake/Output Summary (Last 24 hours) at 12/03/2020 0716 Last data filed at 12/03/2020 0300 Gross per 24 hour  Intake 969.77 ml  Output 500 ml  Net 469.77 ml   Last 3 Weights 11/28/2020 11/28/2020 05/24/2020  Weight (lbs) 195 lb 195 lb 201 lb  Weight (kg) 88.451 kg 88.451 kg 91.173 kg      Telemetry    NSVT 18 beats, also SVT   - Personally Reviewed  ECG    No new - Personally Reviewed  Physical Exam   GEN: No acute distress.   Neck: No JVD Cardiac: RRR, no murmurs, rubs, or gallops.  Respiratory: Clear to auscultation bilaterally. GI: Soft, nontender, non-distended  MS: No edema; No deformity. Neuro:  Nonfocal  Psych: Normal affect   Labs    High Sensitivity Troponin:   Recent Labs  Lab 11/28/20 0613 11/28/20 0819  TROPONINIHS 1,622* 3,242*      Chemistry Recent Labs  Lab 12/01/20 0322 12/02/20 0154 12/03/20 0321  NA 136 136 138   K 3.7 4.1 4.1  CL 104 106 108  CO2 24 23 21*  GLUCOSE 94 105* 104*  BUN 15 14 15   CREATININE 1.09* 1.06* 0.99  CALCIUM 8.3* 8.6* 8.8*  PROT 5.2*  --   --   ALBUMIN 2.7*  --   --   AST 35  --   --   ALT 18  --   --   ALKPHOS 28*  --   --   BILITOT 0.5  --   --   GFRNONAA 52* 54* 59*  ANIONGAP 8 7 9      Hematology Recent Labs  Lab 12/01/20 0322 12/02/20 0154 12/03/20 0321  WBC 7.5 7.5 6.5  RBC 4.03 3.96 4.04  HGB 11.1* 11.0* 11.2*  HCT 34.0* 33.7* 34.8*  MCV 84.4 85.1 86.1  MCH 27.5 27.8 27.7  MCHC 32.6 32.6 32.2  RDW 13.3 13.4 13.4  PLT 161 195 197    BNPNo results for input(s): BNP, PROBNP in the last 168 hours.   DDimer  Recent Labs  Lab 11/28/20 [phone]  DDIMER 0.35     Radiology    VAS [place] LOWER EXTREMITY SAPHENOUS VEIN MAPPING  Result Date: 12/01/2020 LOWER EXTREMITY VEIN MAPPING Patient  Name:  Jacquelin Hawking  Date of Exam:   12/01/2020 Medical Rec #: [id number]    Accession #:    [id number] Date of Birth: [birth year]   Patient Gender: F Patient Age:   68Y Exam Location:  [facility] Procedure:      VAS [place] LOWER EXTREMITY SAPHENOUS VEIN MAPPING Referring Phys: [id number] [facility] G RODDENBERRY --------------------------------------------------------------------------------  Indications: Pre-op History:     Histpry of varicose veins with BLE vein stripping and sclerosing              agent as treatments.  Comparison Study: No previous exams Performing Technologist: Rogelia Rohrer  Examination Guidelines: A complete evaluation includes B-mode imaging, spectral Doppler, color Doppler, and power Doppler as needed of all accessible portions of each vessel. Bilateral testing is considered an integral part of a complete examination. Limited examinations for reoccurring indications may be performed as noted. +---------------+-----------+----------------------+---------------+-----------+   RT Diameter  RT Findings         GSV            LT Diameter  LT Findings      (cm)                                             (cm)                  +---------------+-----------+----------------------+---------------+-----------+      0.49       branching     Saphenofemoral         0.52       branching                                   Junction                                  +---------------+-----------+----------------------+---------------+-----------+                               Proximal thigh         0.27                  +---------------+-----------+----------------------+---------------+-----------+                                 Mid thigh            0.20       branching  +---------------+-----------+----------------------+---------------+-----------+                                Distal thigh          0.13       branching  +---------------+-----------+----------------------+---------------+-----------+ Diagnosing physician: Monica Martinez MD Electronically signed by Monica Martinez MD on 12/01/2020 at 5:17:54 PM.    Final    VAS US DOPPLER PRE CABG  Result Date: 12/01/2020 PREOPERATIVE VASCULAR EVALUATION Patient Name:  KILEEN LANGE  Date of Exam:   12/01/2020 Medical Rec #: [id number]    Accession #:    [id number] Date of Birth: Dec 11, 1940   Patient Gender: F Patient Age:  45Y Exam Location:  [facility] Procedure:      VAS US DOPPLER PRE CABG Referring Phys: [id number] BROADUS Z ATKINS --------------------------------------------------------------------------------  Indications:      Pre-CABG. Risk Factors:     Hypertension, hyperlipidemia, no history of smoking, coronary                   artery disease. Other Factors:    HX of cardiac stents. Comparison Study: No previous exams Performing Technologist: Hill, Jody RVT, RDMS  Examination Guidelines: A complete evaluation includes B-mode imaging, spectral Doppler, color Doppler, and power Doppler as needed of all accessible portions of each vessel. Bilateral testing is considered an integral part  of a complete examination. Limited examinations for reoccurring indications may be performed as noted.  Right Carotid Findings: +----------+--------+--------+--------+--------+------------------+           PSV cm/sEDV cm/sStenosisDescribeComments           +----------+--------+--------+--------+--------+------------------+ CCA Prox  67      10                                         +----------+--------+--------+--------+--------+------------------+ CCA Distal55      11                      intimal thickening +----------+--------+--------+--------+--------+------------------+ ICA Prox  52      14                      intimal thickening +----------+--------+--------+--------+--------+------------------+ ICA Distal95      27                                         +----------+--------+--------+--------+--------+------------------+ ECA       41      0                                          +----------+--------+--------+--------+--------+------------------+ +----------+--------+-------+----------------+------------+           PSV cm/sEDV cmsDescribe        Arm Pressure +----------+--------+-------+----------------+------------+ Subclavian83             Multiphasic, WNL             +----------+--------+-------+----------------+------------+ +---------+--------+--+--------+-+---------+ VertebralPSV cm/s36EDV cm/s8Antegrade +---------+--------+--+--------+-+---------+ Left Carotid Findings: +----------+--------+--------+--------+---------------------+------------------+           PSV cm/sEDV cm/sStenosisDescribe             Comments           +----------+--------+--------+--------+---------------------+------------------+ CCA Prox  81      20                                                      +----------+--------+--------+--------+---------------------+------------------+ CCA Distal60      16              heterogenous, focal  intimal  thickening  and smooth                              +----------+--------+--------+--------+---------------------+------------------+ ICA Prox  51      17              heterogenous and                                                          smooth                                  +----------+--------+--------+--------+---------------------+------------------+ ICA Distal79      25                                                      +----------+--------+--------+--------+---------------------+------------------+ ECA       43      0                                                       +----------+--------+--------+--------+---------------------+------------------+ +----------+--------+--------+----------------+------------+ SubclavianPSV cm/sEDV cm/sDescribe        Arm Pressure +----------+--------+--------+----------------+------------+           95      83      Multiphasic, WNL             +----------+--------+--------+----------------+------------+ +---------+--------+--+--------+--+---------+ VertebralPSV cm/s63EDV cm/s16Antegrade +---------+--------+--+--------+--+---------+  ABI Findings: +--------+------------------+-----+---------+--------+ Right   Rt Pressure (mmHg)IndexWaveform Comment  +--------+------------------+-----+---------+--------+ [id number]                    triphasic         +--------+------------------+-----+---------+--------+ PTA     134               1.02 triphasic         +--------+------------------+-----+---------+--------+ DP      131               1.00 triphasic         +--------+------------------+-----+---------+--------+ +--------+------------------+-----+---------+-------+ Left    Lt Pressure (mmHg)IndexWaveform Comment +--------+------------------+-----+---------+-------+ [id number]                    triphasic         +--------+------------------+-----+---------+-------+ PTA     138               1.05 triphasic        +--------+------------------+-----+---------+-------+ DP      137               1.05 triphasic        +--------+------------------+-----+---------+-------+ +-------+---------------+----------------+ ABI/TBIToday's ABI/TBIPrevious ABI/TBI +-------+---------------+----------------+ Right  1.02                            +-------+---------------+----------------+ Left   1.05                            +-------+---------------+----------------+  Right Doppler Findings: +--------+--------+-----+---------+-----------+ Site    PressureIndexDoppler  Comments    +--------+--------+-----+---------+-----------+ [id number]          triphasic            +--------+--------+-----+---------+-----------+ Radial               triphasicRecent cath +--------+--------+-----+---------+-----------+ Ulnar                triphasic            +--------+--------+-----+---------+-----------+  Left Doppler Findings: +--------+--------+-----+---------+--------+ Site    PressureIndexDoppler  Comments +--------+--------+-----+---------+--------+ [id number]          triphasic         +--------+--------+-----+---------+--------+ Radial               triphasic         +--------+--------+-----+---------+--------+ Ulnar                triphasic         +--------+--------+-----+---------+--------+  Summary: Right Carotid: The extracranial vessels were near-normal with only minimal wall                thickening or plaque. Left Carotid: The extracranial vessels were near-normal with only minimal wall               thickening or plaque. Vertebrals:  Bilateral vertebral arteries demonstrate antegrade flow. Subclavians: Normal flow hemodynamics were seen in bilateral subclavian              arteries. Right ABI: Resting right ankle-brachial index is within normal range. No evidence of  significant right lower extremity arterial disease. Left ABI: Resting left ankle-brachial index is within normal range. No evidence of significant left lower extremity arterial disease. Right Upper Extremity: Abnormal PPG waveforms with radial artery compression suggest an incomplete palmar arch. Doppler waveform obliterate with right radial compression. Doppler waveforms remain within normal limits with right ulnar compression. Left Upper Extremity: Normal PPG waveforms with radial artery compression suggest palmar arch patency. Doppler waveforms remain within normal limits with left radial compression. Doppler waveforms remain within normal limits with left ulnar compression.  Electronically signed by Monica Martinez MD on 12/01/2020 at 5:17:02 PM.    Final     Cardiac Studies   2D echo 11/28/20 1. Left ventricular ejection fraction, by estimation, is 55 to 60%. The  left ventricle has normal function. The left ventricle demonstrates  regional wall motion abnormalities (see scoring diagram/findings for  description). Left ventricular diastolic  parameters are consistent with Grade I diastolic dysfunction (impaired  relaxation).  2. Right ventricular systolic function is normal. The right ventricular  size is normal. There is normal pulmonary artery systolic pressure. The  estimated right ventricular systolic pressure is 51.7 mmHg.  3. The mitral valve is grossly normal. Trivial mitral valve  regurgitation. No evidence of mitral stenosis.  4. The aortic valve is tricuspid. Aortic valve regurgitation is not  visualized. No aortic stenosis is present.  5. The inferior vena cava is normal in size with greater than 50%  respiratory variability, suggesting right atrial pressure of 3 mmHg.   Cath 11/28/20    Prox LAD to Mid LAD lesion is 90% stenosed.  1st Diag lesion is 80% stenosed.  1st Mrg lesion is 90% stenosed.  Mid Cx lesion is 100% stenosed.  Prox RCA lesion is 30%  stenosed.  Prox RCA to Mid RCA lesion is 50% stenosed.  LV end diastolic pressure is mildly elevated.  1. 3 vessel obstructive  CAD - 90% proximal to mid LAD, 80% first diagonal - 90% large OM1, 100% LCX post OM1 with right to left collaterals. - Patent stent in the proximal RCA. 50% mid vessel 2. Mildly elevated LVEDP  Plan: recommend CT surgery consult for CABG.    Patient Profile     80 y.o. female withCAD with prior stenting RCA in 05/2011 with low risk Myoview in 2016, palpitations with brief runs of SVT noted on monitor in 2020, DVT diagnosed on 11/01/2020 on Eliquis, hypertension, hyperlipidemia not on statin, GERD, arthritis, rosacea currently on Doxycycline, migraines,Chiari malformation s/p repair 2001,and Raynaud's syndrome. Presented to [facility] with stuttering CP/indigestion for several days, found to have NSTEMI and MVCAD awaiting CABG.  Assessment & Plan    1. CAD with prior stent to RCA 05/2020, here with NSTEMI - seen by CVTS with plan for CABG 12/04/20 - echo 11/28/20 EF normal 55-60%, grade 1 Dd, trivial MR - continue ASA 81mg  daily - atenolol 25mg  daily was held due to bradycardia HR 40s-50s on admission - HR trend 56-70 - previously intolerant of atorvastatin to trialed on rosuvastatin his admission - continue heparin per pharmacy - NTG drip stopped 2/2 hypotension  2. SVT/NSVT  - 14 beats SVT 5/28, 4 beats NSVT 5/29, 18 beat run of NSVT overnight - K 4.1 - Mg 2.1 - Started low dose Toprol XL 12.5 mg daily no further episodes  3. HTN with soft BP this admission - home atenolol on hold due to bradycardia - home ARB + HCTZ held for cath and not restarted due to episodic soft BPs - today pt is having higher BPs  146/62  150-56 and 138/48  - TSH normal  4. Hyperlipidemia - h/o intolerance to atorvastatin - started on low dose rosuvastatin this admission -If the patient is tolerating statin at time of follow-up appointment, would consider  rechecking liver function/lipid panel in 6-8 weeks - if she shows intolerance to this, would evaluate as OP for PCSK9i in the lipid clinic  5. H/o recent DVT 11/01/20, on Eliquis prior to admission - on IV heparin at this time - anticipate she will need to restart [facility] post CABG since DVT was recent - recent vein mapping did not visualize resolution of DVT - this is a concern for the patient  6. Rosacea - on doxycycline 50mg  daily as outpatient - discontinued yesterday as patient stated only taking PRN. Pharmacy this AM clarified with patient she does take it regularly but just prefers not to take it while here  7. H/o migraines - on Topamax as OP (continued here), uses PRN Ubrelvy as OP  8. Suspect mild CKD stage IIIa by labs - prior baseline Cr 2018 1.05, with range 1.08-1.17 here - continue to follow  9. Constipation - MOM ineffective, will trial dulcolax PRN per patient's request   For questions or updates, please contact [place] Please consult [email or web address] for contact info under        Signed, Cecilie Kicks, NP  12/03/2020, 7:16 AM    I have seen and examined the patient along with Cecilie Kicks, NP .  I have reviewed the chart, notes and new data.  I agree with PA/NP's note.  Key new complaints: no angina with light activity, no dyspnea Key examination changes: normal CV exam Key new findings / data: a couple of NSVT on telemetry.  PLAN: CABG tomorrow.  Sanda Klein, MD, [place] [phone] 12/03/2020, 1:28 PM

## 2020-12-03 NOTE — Progress Notes (Signed)
CARDIAC REHAB PHASE I   PRE:  Rate/Rhythm: 60 SR    BP: sitting 117/46    SaO2:   MODE:  Ambulation: 470 ft   POST:  Rate/Rhythm: 70 SR    BP: sitting 103/59     SaO2:    Tolerated well, no CP. To recliner. BP lower (pt had been concerned that it has been too high). Discussed IS, sternal precautions, mobility post op, and d/c planning. Pt receptive and has already read OHS booklet and watched preop video. 1500 mL on IS. Her family will be with her at d/c. [phone]  [place], ACSM 12/03/2020 10:29 AM

## 2020-12-03 NOTE — Progress Notes (Signed)
[place] for Heparin Indication: 3VCAD and recent DVT  Allergies  Allergen Reactions  . Metronidazole Hives  . Atorvastatin     Muscle pain   . Ciprofloxacin Hives  . Fentanyl Hives    Itching all over   . Morphine And Related     Hallucinations   . Tetanus Toxoids     Localized knot   . Augmentin [Amoxicillin-Pot Clavulanate] Nausea And Vomiting    Pt has taken Keflex (cephalexin) without adverse reaction. Started 08/06/16    Patient Measurements: Height: 5\' 6"  (167.6 cm) Weight: 88.5 kg (195 lb) IBW/kg (Calculated) : 59.3 kg Heparin Dosing Weight: 80 kg  Vital Signs: Temp: 98.4 F (36.9 C) (05/31 0330) Temp Source: Oral (05/31 0330) BP: 146/62 (05/31 0330) Pulse Rate: 80 (05/31 0330)  Labs: Recent Labs    12/01/20 0322 12/02/20 0154 12/03/20 0321  HGB 11.1* 11.0* 11.2*  HCT 34.0* 33.7* 34.8*  PLT 161 195 197  APTT 78*  --   --   LABPROT  --   --  13.1  INR  --   --  1.0  HEPARINUNFRC 0.58 0.48 0.39  CREATININE 1.09* 1.06* 0.99    Estimated Creatinine Clearance: 53.3 mL/min (by C-G formula based on SCr of 0.99 mg/dL).   Medical History: Past Medical History:  Diagnosis Date  . Allergy   . Anemia    Hx  . Anginal pain ([place])   . Arthritis    neck, lower back  . Asthma   . Change in bowel habits   . Chronic kidney disease   . Complication of anesthesia   . Coronary artery disease   . Dyspnea   . GERD (gastroesophageal reflux disease)   . HLD (hyperlipidemia)    hx - no med  . HTN (hypertension)   . Migraine   . Myocardial infarction ([place])   . Pneumonia   . PONV (postoperative nausea and vomiting)   . Stented coronary artery    right, no problems  . SVD (spontaneous vaginal delivery)    x 2  . Vitamin B12 deficiency    Assessment: 80 yo female admitted with chest pain found to have mvCAD. PTA the patient is on apixaban for recent DVT in 10/2020. The patient is planned for CABG on Wednesday 6/1.  Pharmacy is consulted to dose heparin.  Heparin level today is therapeutic at 0.4 on heparin drip rate 1300 units/hour. CBC is stable, no s/sx bleeding reported.  Goal of Therapy:  Heparin level 0.3-0.7 units/ml  Monitor platelets by anticoagulation protocol: Yes   Plan:  -Continue heparin IV at 1300 units/hr -Obtain a daily heparin level and CBC -Monitor for signs and symptoms of bleeding    Bonnita Nasuti Pharm.D. CPP, BCPS Clinical Pharmacist [phone] 12/03/2020 2:04 PM    12/03/2020 2:02 PM Please see AMION for all pharmacy numbers

## 2020-12-03 NOTE — Anesthesia Preprocedure Evaluation (Addendum)
Anesthesia Evaluation  Patient identified by MRN, date of birth, ID band Patient awake    Reviewed: Allergy & Precautions, NPO status , Patient's Chart, lab work & pertinent test results, reviewed documented beta blocker date and time   History of Anesthesia Complications (+) PONV and history of anesthetic complications  Airway Mallampati: III  TM Distance: >3 FB Neck ROM: Full    Dental  (+) Dental Advisory Given, Teeth Intact   Pulmonary asthma ,    Pulmonary exam normal        Cardiovascular hypertension, Pt. on home beta blockers and Pt. on medications + CAD, + Past MI and + Cardiac Stents  Normal cardiovascular exam   '22 TTE - EF 55-60% with LV posterior wall hypokinesis. Grade I diastolic dysfunction. Trivial MR, TR, and PR.  '22 Cath - 1. 3 vessel obstructive CAD    - 90% proximal to mid LAD, 80% first diagonal    - 90% large OM1, 100% LCX post OM1 with right to left collaterals.    - Patent stent in the proximal RCA. 50% mid vessel 2. Mildly elevated LVEDP  '22 Carotid US - Near normal vessels   Neuro/Psych  Headaches, negative psych ROS   GI/Hepatic Neg liver ROS, GERD  Controlled,  Endo/Other   Obesity   Renal/GU CRFRenal disease     Musculoskeletal  (+) Arthritis ,   Abdominal   Peds  Hematology  (+) Blood dyscrasia, anemia ,  On eliquis    Anesthesia Other Findings Covid test negative   Reproductive/Obstetrics                            Anesthesia Physical Anesthesia Plan  ASA: IV  Anesthesia Plan: General   Post-op Pain Management:    Induction: Intravenous  PONV Risk Score and Plan: 4 or greater and Treatment may vary due to age or medical condition  Airway Management Planned: Oral ETT  Additional Equipment: Arterial line, CVP, PA Cath, TEE and Ultrasound Guidance Line Placement  Intra-op Plan:   Post-operative Plan: Post-operative  intubation/ventilation  Informed Consent: I have reviewed the patients History and Physical, chart, labs and discussed the procedure including the risks, benefits and alternatives for the proposed anesthesia with the patient or authorized representative who has indicated his/her understanding and acceptance.     Dental advisory given  Plan Discussed with: CRNA and Anesthesiologist  Anesthesia Plan Comments:        Anesthesia Quick Evaluation

## 2020-12-03 NOTE — Consult Note (Signed)
ASSESSMENT & PLAN   CHRONIC VENOUS INSUFFICIENCY: This patient does have significant venous disease with a right popliteal DVT and a history of previous vein stripping bilaterally.  She has no vein conduits in either lower extremity based on her vein map.  She has normal arterial flow.  Postoperatively she would benefit from leg elevation, mild compression, and exercise as tolerated.  HISTORY OF RAYNAUD'S SYNDROME: She does have a history would suggest some underlying vasospastic Raynaud's associated with cold exposure.  However her noninvasive studies show no evidence of upper extremity arterial occlusive disease on the left and she has a patent palmar arch on the left.  For this reason I think it would be safe to take her left radial artery for upcoming coronary revascularization.  She has evidence of an incomplete palmar arch on the right so would not take right radial artery.    REASON FOR CONSULT:    Chronic venous insufficiency and history of Raynaud's syndrome.  The consult is requested by Dr. Orvan Seen.  HPI:   Krystal Mcpherson is a 80 y.o. female who is scheduled for coronary revascularization tomorrow.  She has a history of chronic venous insufficiency and also a history of Raynaud's syndrome and for this reason vascular surgery was consulted.  The patient has a history of a recent right popliteal DVT that was diagnosed on 11/02/2020. (cf duplex report below.)  She has been on Eliquis for this. The patient has had bilateral lower extremity vein stripping.  She denies significant symptoms associated with her venous disease.  She had no prior history of DVT.  This winter, when we had a real cold spell she began having problems with her hands turning white and the fingers getting numb when she was exposed to cold.  She learned to keep gloves on and this helped significantly.  She has no previous history of severe cold exposure or frostbite.  She has always lived in this area and has not lived in  a cold damp climate in the past.  She denies any history of claudication, rest pain, or nonhealing ulcers.,  She is had no history of stroke, TIAs, expressive or receptive aphasia, or amaurosis fugax.  Past Medical History:  Diagnosis Date  . Allergy   . Anemia    Hx  . Anginal pain ([place])   . Arthritis    neck, lower back  . Asthma   . Change in bowel habits   . Chronic kidney disease   . Complication of anesthesia   . Coronary artery disease   . Dyspnea   . GERD (gastroesophageal reflux disease)   . HLD (hyperlipidemia)    hx - no med  . HTN (hypertension)   . Migraine   . Myocardial infarction ([place])   . Pneumonia   . PONV (postoperative nausea and vomiting)   . Stented coronary artery    right, no problems  . SVD (spontaneous vaginal delivery)    x 2  . Vitamin B12 deficiency     Family History  Problem Relation Age of Onset  . Colon cancer Paternal Grandmother   . Hypertension Mother   . Heart attack Mother   . Hypertension Father   . Heart attack Father   . Arthritis Sister   . Obesity Sister   . Hypertension Sister   . Thyroid cancer Sister   . Kidney cancer Sister   . Breast cancer Sister   . Hypertension Sister   . Pancreatitis Sister   .  Melanoma Sister   . Hypertension Sister   . Esophageal cancer Neg Hx   . Rectal cancer Neg Hx   . Stomach cancer Neg Hx     SOCIAL HISTORY: Social History   Tobacco Use  . Smoking status: Never Smoker  . Smokeless tobacco: Never Used  Substance Use Topics  . Alcohol use: No    Alcohol/week: 0.0 standard drinks    Allergies  Allergen Reactions  . Metronidazole Hives  . Atorvastatin     Muscle pain   . Ciprofloxacin Hives  . Fentanyl Hives    Itching all over   . Morphine And Related     Hallucinations   . Tetanus Toxoids     Localized knot   . Augmentin [Amoxicillin-Pot Clavulanate] Nausea And Vomiting    Pt has taken Keflex (cephalexin) without adverse reaction. Started 08/06/16    Current  Facility-Administered Medications  Medication Dose Route Frequency Provider Last Rate Last Admin  . 0.9 %  sodium chloride infusion  250 mL Intravenous PRN Martinique, Peter M, MD      . acetaminophen (TYLENOL) tablet 1,000 mg  1,000 mg Oral Q6H PRN Buford Dresser, MD   1,000 mg at 11/30/20 2046  . albuterol (VENTOLIN HFA) 108 (90 Base) MCG/ACT inhaler 2 puff  2 puff Inhalation Q4H PRN Sande Rives E, PA-C      . aspirin EC tablet 81 mg  81 mg Oral Daily Sande Rives E, PA-C   81 mg at 12/03/20 0843  . bisacodyl (DULCOLAX) EC tablet 5-10 mg  5-10 mg Oral Daily PRN Melina Copa N, PA-C   5 mg at 12/01/20 2141  . [START ON 12/04/2020] ceFAZolin (ANCEF) IVPB 2g/100 mL premix  2 g Intravenous To OR Atkins, Glenice Bow, MD      . Derrill Memo ON 12/04/2020] ceFAZolin (ANCEF) IVPB 2g/100 mL premix  2 g Intravenous To OR Atkins, Glenice Bow, MD      . Derrill Memo ON 12/04/2020] dexmedetomidine (PRECEDEX) 400 MCG/100ML (4 mcg/mL) infusion  0.1-0.7 mcg/kg/hr Intravenous To OR Atkins, Glenice Bow, MD      . Derrill Memo ON 12/04/2020] electrolyte-A (PLASMALYTE-A PH 7.4) 1,000 mL with heparin sodium (porcine) 5,000 units, papaverine 60 mg irrigation   Irrigation On Call to OR Wonda Olds, MD      . Derrill Memo ON 12/04/2020] EPINEPHrine (ADRENALIN) 4 mg in NS 250 mL (0.016 mg/mL) premix infusion  0-10 mcg/min Intravenous To OR Atkins, Glenice Bow, MD      . Derrill Memo ON 12/04/2020] heparin 30,000 units/NS 1000 mL solution for CELLSAVER   Other To OR Atkins, Broadus Z, MD      . heparin ADULT infusion 100 units/mL (25000 units/281mL)  1,300 Units/hr Intravenous Continuous Karren Cobble, RPH 13 mL/hr at 12/03/20 0459 1,300 Units/hr at 12/03/20 0459  . [START ON 12/04/2020] insulin regular, human (MYXREDLIN) 100 units/ 100 mL infusion   Intravenous To OR Atkins, Glenice Bow, MD      . loratadine (CLARITIN) tablet 10 mg  10 mg Oral Daily Sande Rives E, PA-C   10 mg at 12/03/20 0843  . magnesium hydroxide (MILK OF MAGNESIA) suspension  30 mL  30 mL Oral Daily PRN Buford Dresser, MD   30 mL at 11/30/20 2048  . [START ON 12/04/2020] magnesium sulfate (IV Push/IM) injection 40 mEq  40 mEq Other To OR Atkins, Glenice Bow, MD      . menthol-cetylpyridinium (CEPACOL) lozenge 3 mg  1 lozenge Oral PRN Buford Dresser, MD      .  metoprolol succinate (TOPROL-XL) 24 hr tablet 12.5 mg  12.5 mg Oral Daily Pixie Casino, MD   12.5 mg at 12/03/20 0843  . [START ON 12/04/2020] milrinone (PRIMACOR) 20 MG/100 ML (0.2 mg/mL) infusion  0.3 mcg/kg/min Intravenous To OR Atkins, Glenice Bow, MD      . nitroGLYCERIN (NITROSTAT) SL tablet 0.4 mg  0.4 mg Sublingual Q5 min PRN Sande Rives E, PA-C   0.4 mg at 12/01/20 1412  . [START ON 12/04/2020] nitroGLYCERIN 50 mg in dextrose 5 % 250 mL (0.2 mg/mL) infusion  2-200 mcg/min Intravenous To OR Atkins, Glenice Bow, MD      . Derrill Memo ON 12/04/2020] norepinephrine (LEVOPHED) 4mg  in 242mL premix infusion  0-40 mcg/min Intravenous To OR Atkins, Glenice Bow, MD      . ondansetron (ZOFRAN) injection 4 mg  4 mg Intravenous Q6H PRN Martinique, Peter M, MD   4 mg at 11/29/20 0432  . [START ON 12/04/2020] phenylephrine (NEOSYNEPHRINE) 20-0.9 MG/250ML-% infusion  30-200 mcg/min Intravenous To OR Atkins, Glenice Bow, MD      . Derrill Memo ON 12/04/2020] potassium chloride injection 80 mEq  80 mEq Other To OR Atkins, Glenice Bow, MD      . rosuvastatin (CRESTOR) tablet 5 mg  5 mg Oral Daily Buford Dresser, MD   5 mg at 12/03/20 0843  . sodium chloride flush (NS) 0.9 % injection 3 mL  3 mL Intravenous Q12H Martinique, Peter M, MD   3 mL at 12/02/20 2149  . sodium chloride flush (NS) 0.9 % injection 3 mL  3 mL Intravenous PRN Martinique, Peter M, MD      . topiramate (TOPAMAX) tablet 50 mg  50 mg Oral Q2200 Buford Dresser, MD   50 mg at 12/02/20 2149  . [START ON 12/04/2020] tranexamic acid (CYKLOKAPRON) 2,500 mg in sodium chloride 0.9 % 250 mL (10 mg/mL) infusion  1.5 mg/kg/hr Intravenous To OR Atkins, Glenice Bow, MD      . Derrill Memo  ON 12/04/2020] tranexamic acid (CYKLOKAPRON) bolus via infusion - over 30 minutes 1,327.5 mg  15 mg/kg Intravenous To OR Atkins, Glenice Bow, MD      . Derrill Memo ON 12/04/2020] tranexamic acid (CYKLOKAPRON) pump prime solution 177 mg  2 mg/kg Intracatheter To OR Atkins, Glenice Bow, MD      . Derrill Memo ON 12/04/2020] vancomycin (VANCOREADY) IVPB 1500 mg/300 mL  1,500 mg Intravenous To OR Atkins, Glenice Bow, MD        REVIEW OF SYSTEMS:  [X]  denotes positive finding, [ ]  denotes negative finding Cardiac  Comments:  Chest pain or chest pressure:    Shortness of breath upon exertion:    Short of breath when lying flat:    Irregular heart rhythm:        Vascular    Pain in calf, thigh, or hip brought on by ambulation:    Pain in feet at night that wakes you up from your sleep:     Blood clot in your veins:    Leg swelling:         Pulmonary    Oxygen at home:    Productive cough:     Wheezing:         Neurologic    Sudden weakness in arms or legs:     Sudden numbness in arms or legs:     Sudden onset of difficulty speaking or slurred speech:    Temporary loss of vision in one eye:     Problems with dizziness:  Gastrointestinal    Blood in stool:     Vomited blood:         Genitourinary    Burning when urinating:     Blood in urine:        Psychiatric    Major depression:         Hematologic    Bleeding problems:    Problems with blood clotting too easily:        Skin    Rashes or ulcers:        Constitutional    Fever or chills:    -  PHYSICAL EXAM:   Vitals:   12/02/20 1515 12/02/20 2022 12/02/20 2118 12/03/20 0330  BP: (!) 143/54 (!) 150/56 (!) 138/48 (!) 146/62  Pulse: 66   80  Resp: 16 16  15   Temp: 98.4 F (36.9 C) 99 F (37.2 C)  98.4 F (36.9 C)  TempSrc: Oral Oral  Oral  SpO2:    98%  Weight:      Height:       Body mass index is 31.47 kg/m.   GENERAL: The patient is a well-nourished female, in no acute distress. The vital signs are documented  above. CARDIAC: There is a regular rate and rhythm.  VASCULAR: I do not detect carotid bruits. I cannot palpate pedal pulses however both feet are warm and well-perfused. She has mild bilateral lower extremity swelling and some small varicose veins bilaterally. PULMONARY: There is good air exchange bilaterally without wheezing or rales. ABDOMEN: Soft and non-tender with normal pitched bowel sounds.  MUSCULOSKELETAL: There are no major deformities. NEUROLOGIC: No focal weakness or paresthesias are detected. SKIN: There are no ulcers or rashes noted. PSYCHIATRIC: The patient has a normal affect.  DATA:    VENOUS DUPLEX: I reviewed the venous duplex scan that was done on 11/01/2020.  On the right side there is a bifid distal femoral vein and bifid popliteal vein.  One of the paired popliteal veins has occlusive thrombus and the other is patent.  One of the proximal posterior tibial veins likely has proximal thrombus.  On the left side there is no evidence of DVT.  BILATERAL LOWER EXTREMITY VEIN MAP: The patient has had previous bilateral lower extremity vein stripping.   CAROTID DUPLEX: Her preoperative carotid duplex scan shows no evidence of carotid disease bilaterally.  Both vertebral arteries are patent with antegrade flow.  There is no evidence of subclavian artery disease bilaterally.  LOWER EXTREMITY DOPPLER STUDY: I have reviewed the lower extremity Doppler study that was done.  On the right side there is a triphasic dorsalis pedis and posterior tibial signal with an ABI of 100%.  On the left side there is a triphasic dorsalis pedis and posterior tibial signal with an ABI of 100%.  UPPER EXTREMITY ARTERIAL DOPPLER STUDY: I have reviewed the upper extremity arterial Doppler study.  On the right side there is a triphasic radial and ulnar signal.  There are abnormal PPG waveforms with radial artery compression suggesting an incomplete palmar arch.  Doppler waveforms obliterate with  right radial artery compression.  Doppler waveforms remain within normal limits with right ulnar artery compression.  On the left side there is a triphasic radial and ulnar signal.  There are normal PPG waveforms with radial artery compression suggesting the palmar arch is patent.  The Doppler waveforms remain within normal limits with left radial artery compression.  The Doppler waveforms remain within normal limits with left ulnar artery compression.  Lab Results  Component Value Date   WBC 6.5 12/03/2020   HGB 11.2 (L) 12/03/2020   HCT 34.8 (L) 12/03/2020   MCV 86.1 12/03/2020   PLT 197 12/03/2020   Lab Results  Component Value Date   NA 138 12/03/2020   K 4.1 12/03/2020   CL 108 12/03/2020   CO2 21 (L) 12/03/2020   Lab Results  Component Value Date   CREATININE 0.99 12/03/2020   Lab Results  Component Value Date   INR 1.0 12/03/2020   Lab Results  Component Value Date   HGBA1C 5.8 (H) 11/29/2020   [facility]: [phone] Office: [phone]

## 2020-12-03 NOTE — Progress Notes (Incomplete)
Cardiology Office Note:    Date:  12/03/2020   ID:  [name], DOB [birth year], MRN [id number]  PCP:  Kristen Loader, FNP   [facility] Providers Cardiologist:  Candee Furbish, MD { Click to update primary MD,subspecialty MD or APP then REFRESH:1}    Referring MD: Kristen Loader, FNP    History of Present Illness:    Krystal Mcpherson is a 80 y.o. female here for follow up of left arm numbness and pain.   Right coronary artery stent multiple nuclear stress test have been low risk.  No perfusion defects.   Today, ***   She denies any exertional chest pain, tightness, or pressure. She has no orthopnea, PND, LE edema, or lightheadedness     Past Medical History:  Diagnosis Date  . Allergy   . Anemia    Hx  . Anginal pain ([place])   . Arthritis    neck, lower back  . Asthma   . Change in bowel habits   . Chronic kidney disease   . Complication of anesthesia   . Coronary artery disease   . Dyspnea   . GERD (gastroesophageal reflux disease)   . HLD (hyperlipidemia)    hx - no med  . HTN (hypertension)   . Migraine   . Myocardial infarction ([place])   . Pneumonia   . PONV (postoperative nausea and vomiting)   . Stented coronary artery    right, no problems  . SVD (spontaneous vaginal delivery)    x 2  . Vitamin B12 deficiency     Past Surgical History:  Procedure Laterality Date  . bladder tact    . 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 CHOLECYSTECTOMY    . COLONOSCOPY  2009    st[facility]  . cornary stent Right 05/2011  . LEFT HEART CATH AND CORONARY ANGIOGRAPHY N/A 11/28/2020   Procedure: LEFT HEART CATH AND CORONARY ANGIOGRAPHY;  Surgeon: Martinique, Peter M, MD;  Location: [facility];  Service: Cardiovascular;  Laterality: N/A;  . NASAL SINUS SURGERY     x 2  . TOTAL ABDOMINAL HYSTERECTOMY W/ BILATERAL SALPINGOOPHORECTOMY      Current Medications: No outpatient medications have been marked as taking for the 12/03/20 encounter (Appointment)  with Jerline Pain, MD.     Allergies:   Metronidazole, Atorvastatin, Ciprofloxacin, Fentanyl, Morphine and related, Tetanus toxoids, and Augmentin [amoxicillin-pot clavulanate]   Social History   Socioeconomic History  . Marital status: Widowed    Spouse name: Not on file  . Number of children: Not on file  . Years of education: Not on file  . Highest education level: Not on file  Occupational History  . Not on file  Tobacco Use  . Smoking status: Never Smoker  . Smokeless tobacco: Never Used  Vaping Use  . Vaping Use: Never used  Substance and Sexual Activity  . Alcohol use: No    Alcohol/week: 0.0 standard drinks  . Drug use: No  . Sexual activity: Never    Birth control/protection: Post-menopausal  Other Topics Concern  . Not on file  Social History Narrative  . Not on file   Social Determinants of Health   Financial Resource Strain: Not on file  Food Insecurity: Not on file  Transportation Needs: Not on file  Physical Activity: Not on file  Stress: Not on file  Social Connections: Not on file     Family History: The patient's ***family history includes  Arthritis in her sister; Breast cancer in her sister; Colon cancer in her paternal grandmother; Heart attack in her father and mother; Hypertension in her father, mother, sister, sister, and sister; Kidney cancer in her sister; Melanoma in her sister; Obesity in her sister; Pancreatitis in her sister; Thyroid cancer in her sister. There is no history of Esophageal cancer, Rectal cancer, or Stomach cancer.  ROS:   Please see the history of present illness. All other systems reviewed and are negative.  EKGs/Labs/Other Studies Reviewed:    The following studies were reviewed today: Vas US Doppler Pre CABG Carotid and Arms/Leg 12/01/20- Summary:  Right Carotid: The extracranial vessels were near-normal with only minimal  wall thickening or plaque.   Left Carotid: The extracranial vessels were near-normal with  only minimal  wall thickening or plaque.  Vertebrals: Bilateral vertebral arteries demonstrate antegrade flow.  Subclavians: Normal flow hemodynamics were seen in bilateral subclavian arteries.   Right ABI: Resting right ankle-brachial index is within normal range. No  evidence of significant right lower extremity arterial disease.  Left ABI: Resting left ankle-brachial index is within normal range. No  evidence of significant left lower extremity arterial disease.  Right Upper Extremity: Abnormal PPG waveforms with radial artery  compression suggest an incomplete palmar arch. Doppler waveform obliterate with right radial compression. Doppler waveforms remain within normal limits with right ulnar compression.  Left Upper Extremity: Normal PPG waveforms with radial artery compression suggest palmar arch patency. Doppler waveforms remain within normal limits with left radial compression. Doppler waveforms remain within normal limits with left ulnar compression.   Echo 11/28/20- FINDINGS  Left Ventricle: Left ventricular ejection fraction, by estimation, is 55  to 60%. The left ventricle has normal function. The left ventricle  demonstrates regional wall motion abnormalities. The left ventricular  internal cavity size was normal in size.  There is no left ventricular hypertrophy. Left ventricular diastolic  parameters are consistent with Grade I diastolic dysfunction (impaired  relaxation).    Left Heart Cath and Coronary Angio 11/28/20-  Prox LAD to Mid LAD lesion is 90% stenosed.  1st Diag lesion is 80% stenosed.  1st Mrg lesion is 90% stenosed.  Mid Cx lesion is 100% stenosed.  Prox RCA lesion is 30% stenosed.  Prox RCA to Mid RCA lesion is 50% stenosed.  LV end diastolic pressure is mildly elevated.   1. 3 vessel obstructive CAD    - 90% proximal to mid LAD, 80% first diagonal    - 90% large OM1, 100% LCX post OM1 with right to left collaterals.    - Patent stent in the  proximal RCA. 50% mid vessel 2. Mildly elevated LVEDP   EKG:   12/03/20-EKG is *** ordered today.    Recent Labs: 12/01/2020: ALT 18; Magnesium 2.1 12/02/2020: TSH 2.743 12/03/2020: BUN 15; Creatinine, Ser 0.99; Hemoglobin 11.2; Platelets 197; Potassium 4.1; Sodium 138  Recent Lipid Panel    Component Value Date/Time   CHOL 154 11/29/2020 0609   TRIG 54 11/29/2020 0609   HDL 49 11/29/2020 0609   CHOLHDL 3.1 11/29/2020 0609   VLDL 11 11/29/2020 0609   LDLCALC 94 11/29/2020 0609     Risk Assessment/Calculations:   {Does this patient have ATRIAL FIBRILLATION?[phone]   Physical Exam:    VS:  There were no vitals taken for this visit.    Wt Readings from Last 3 Encounters:  11/28/20 195 lb (88.5 kg)  05/24/20 201 lb (91.2 kg)  11/04/19 200 lb (90.7 kg)  GEN: Well nourished, well developed in no acute distress HEENT: Normal NECK: No JVD; No carotid bruits LYMPHATICS: No lymphadenopathy CARDIAC: RRR, no murmurs, rubs, gallops RESPIRATORY:  Clear to auscultation without rales, wheezing or rhonchi  ABDOMEN: Soft, non-tender, non-distended MUSCULOSKELETAL:  No edema; No deformity  SKIN: Warm and dry NEUROLOGIC:  Alert and oriented x 3 PSYCHIATRIC:  Normal affect   ASSESSMENT:    No diagnosis found. PLAN:    In order of problems listed above: Prior tachycardia palpitations -ZIO monitor 11/21/2018: Brief, less than 5 beat runs of supraventricular tachycardia, fastest at 160 bpm, appears to be paroxysmal atrial tachycardia.  No evidence of atrial fibrillation or flutter.  No significant pauses noted.  Occasionally symptoms of heart fluttering are associated with the short SVT lasting 4-5 beats.  Occasional heart fluttering or skipping could be associated with the short-lived SVT episodes. These could be conservatively with no further medication as they should be benign. However, if desired, we can increase the atenolol from 12.5 mg once a day to 25 mg a day.  This may help calm down some of these skipped beats. ***  Maybe one or 2 skips.   Migraine headaches ***  Coronary artery disease post right coronary stent *** Ankle swelling ***   {Are you ordering a CV Procedure (e.g. stress test, cath, DCCV, TEE, etc)?   Press F2        :[id number]}    Medication Adjustments/Labs and Tests Ordered: Current medicines are reviewed at length with the patient today.  Concerns regarding medicines are outlined above.  No orders of the defined types were placed in this encounter.  No orders of the defined types were placed in this encounter.   There are no Patient Instructions on file for this visit.   Bridgett Larsson  12/03/2020 12:45 PM    [facility]

## 2020-12-04 ENCOUNTER — Inpatient Hospital Stay (HOSPITAL_COMMUNITY): Payer: Medicare PPO

## 2020-12-04 ENCOUNTER — Encounter (HOSPITAL_COMMUNITY): Payer: Self-pay | Admitting: Cardiology

## 2020-12-04 ENCOUNTER — Inpatient Hospital Stay (HOSPITAL_COMMUNITY)
Admission: EM | Disposition: A | Discharge status: Home / Self Care | Payer: Self-pay | Source: Home / Self Care | Attending: Cardiothoracic Surgery

## 2020-12-04 ENCOUNTER — Inpatient Hospital Stay (HOSPITAL_COMMUNITY): Payer: Medicare PPO | Admitting: Certified Registered Nurse Anesthetist

## 2020-12-04 DIAGNOSIS — I2510 Atherosclerotic heart disease of native coronary artery without angina pectoris: Secondary | ICD-10-CM | POA: Diagnosis present

## 2020-12-04 DIAGNOSIS — Z951 Presence of aortocoronary bypass graft: Secondary | ICD-10-CM

## 2020-12-04 HISTORY — PX: CORONARY ARTERY BYPASS GRAFT: SHX141

## 2020-12-04 HISTORY — PX: CLIPPING OF ATRIAL APPENDAGE: SHX5773

## 2020-12-04 HISTORY — PX: TEE WITHOUT CARDIOVERSION: SHX5443

## 2020-12-04 HISTORY — PX: RADIAL ARTERY HARVEST: SHX5067

## 2020-12-04 LAB — POCT I-STAT 7, (LYTES, BLD GAS, ICA,H+H)
Acid-base deficit: 2 mmol/L (ref 0.0–2.0)
Acid-base deficit: 1 mmol/L (ref 0.0–2.0)
Acid-base deficit: 1 mmol/L (ref 0.0–2.0)
Acid-base deficit: 1 mmol/L (ref 0.0–2.0)
Acid-base deficit: 5 mmol/L — ABNORMAL HIGH (ref 0.0–2.0)
Acid-base deficit: 3 mmol/L — ABNORMAL HIGH (ref 0.0–2.0)
Acid-base deficit: 3 mmol/L — ABNORMAL HIGH (ref 0.0–2.0)
Bicarbonate: 24.3 mmol/L (ref 20.0–28.0)
Bicarbonate: 22.9 mmol/L (ref 20.0–28.0)
Bicarbonate: 23.4 mmol/L (ref 20.0–28.0)
Bicarbonate: 24.1 mmol/L (ref 20.0–28.0)
Bicarbonate: 20.5 mmol/L (ref 20.0–28.0)
Bicarbonate: 22.8 mmol/L (ref 20.0–28.0)
Bicarbonate: 22.8 mmol/L (ref 20.0–28.0)
Calcium, Ion: 1.31 mmol/L (ref 1.15–1.40)
Calcium, Ion: 0.98 mmol/L — ABNORMAL LOW (ref 1.15–1.40)
Calcium, Ion: 1.03 mmol/L — ABNORMAL LOW (ref 1.15–1.40)
Calcium, Ion: 1.1 mmol/L — ABNORMAL LOW (ref 1.15–1.40)
Calcium, Ion: 1.16 mmol/L (ref 1.15–1.40)
Calcium, Ion: 1.12 mmol/L — ABNORMAL LOW (ref 1.15–1.40)
Calcium, Ion: 1.16 mmol/L (ref 1.15–1.40)
HCT: 31 % — ABNORMAL LOW (ref 36.0–46.0)
HCT: 23 % — ABNORMAL LOW (ref 36.0–46.0)
HCT: 27 % — ABNORMAL LOW (ref 36.0–46.0)
HCT: 27 % — ABNORMAL LOW (ref 36.0–46.0)
HCT: 34 % — ABNORMAL LOW (ref 36.0–46.0)
HCT: 30 % — ABNORMAL LOW (ref 36.0–46.0)
HCT: 29 % — ABNORMAL LOW (ref 36.0–46.0)
Hemoglobin: 10.5 g/dL — ABNORMAL LOW (ref 12.0–15.0)
Hemoglobin: 7.8 g/dL — ABNORMAL LOW (ref 12.0–15.0)
Hemoglobin: 9.2 g/dL — ABNORMAL LOW (ref 12.0–15.0)
Hemoglobin: 9.2 g/dL — ABNORMAL LOW (ref 12.0–15.0)
Hemoglobin: 11.6 g/dL — ABNORMAL LOW (ref 12.0–15.0)
Hemoglobin: 10.2 g/dL — ABNORMAL LOW (ref 12.0–15.0)
Hemoglobin: 9.9 g/dL — ABNORMAL LOW (ref 12.0–15.0)
O2 Saturation: 100 %
O2 Saturation: 100 %
O2 Saturation: 100 %
O2 Saturation: 100 %
O2 Saturation: 97 %
O2 Saturation: 96 %
O2 Saturation: 98 %
Patient temperature: 35.8
Patient temperature: 36.4
Patient temperature: 36.4
Potassium: 4.1 mmol/L (ref 3.5–5.1)
Potassium: 4.2 mmol/L (ref 3.5–5.1)
Potassium: 5 mmol/L (ref 3.5–5.1)
Potassium: 5.3 mmol/L — ABNORMAL HIGH (ref 3.5–5.1)
Potassium: 4 mmol/L (ref 3.5–5.1)
Potassium: 3.9 mmol/L (ref 3.5–5.1)
Potassium: 3.9 mmol/L (ref 3.5–5.1)
Sodium: 140 mmol/L (ref 135–145)
Sodium: 139 mmol/L (ref 135–145)
Sodium: 137 mmol/L (ref 135–145)
Sodium: 139 mmol/L (ref 135–145)
Sodium: 140 mmol/L (ref 135–145)
Sodium: 141 mmol/L (ref 135–145)
Sodium: 139 mmol/L (ref 135–145)
TCO2: 26 mmol/L (ref 22–32)
TCO2: 24 mmol/L (ref 22–32)
TCO2: 24 mmol/L (ref 22–32)
TCO2: 25 mmol/L (ref 22–32)
TCO2: 22 mmol/L (ref 22–32)
TCO2: 24 mmol/L (ref 22–32)
TCO2: 24 mmol/L (ref 22–32)
pCO2 arterial: 46.4 mmHg (ref 32.0–48.0)
pCO2 arterial: 35.3 mmHg (ref 32.0–48.0)
pCO2 arterial: 35.4 mmHg (ref 32.0–48.0)
pCO2 arterial: 38.6 mmHg (ref 32.0–48.0)
pCO2 arterial: 38.3 mmHg (ref 32.0–48.0)
pCO2 arterial: 41.2 mmHg (ref 32.0–48.0)
pCO2 arterial: 43.5 mmHg (ref 32.0–48.0)
pH, Arterial: 7.327 — ABNORMAL LOW (ref 7.350–7.450)
pH, Arterial: 7.42 (ref 7.350–7.450)
pH, Arterial: 7.403 (ref 7.350–7.450)
pH, Arterial: 7.428 (ref 7.350–7.450)
pH, Arterial: 7.331 — ABNORMAL LOW (ref 7.350–7.450)
pH, Arterial: 7.348 — ABNORMAL LOW (ref 7.350–7.450)
pH, Arterial: 7.324 — ABNORMAL LOW (ref 7.350–7.450)
pO2, Arterial: 522 mmHg — ABNORMAL HIGH (ref 83.0–108.0)
pO2, Arterial: 357 mmHg — ABNORMAL HIGH (ref 83.0–108.0)
pO2, Arterial: 376 mmHg — ABNORMAL HIGH (ref 83.0–108.0)
pO2, Arterial: 377 mmHg — ABNORMAL HIGH (ref 83.0–108.0)
pO2, Arterial: 87 mmHg (ref 83.0–108.0)
pO2, Arterial: 85 mmHg (ref 83.0–108.0)
pO2, Arterial: 112 mmHg — ABNORMAL HIGH (ref 83.0–108.0)

## 2020-12-04 LAB — BASIC METABOLIC PANEL
Anion gap: 9 (ref 5–15)
Anion gap: 10 (ref 5–15)
BUN: 19 mg/dL (ref 8–23)
BUN: 14 mg/dL (ref 8–23)
CO2: 25 mmol/L (ref 22–32)
CO2: 23 mmol/L (ref 22–32)
Calcium: 8.9 mg/dL (ref 8.9–10.3)
Calcium: 7.2 mg/dL — ABNORMAL LOW (ref 8.9–10.3)
Chloride: 105 mmol/L (ref 98–111)
Chloride: 106 mmol/L (ref 98–111)
Creatinine, Ser: 1.37 mg/dL — ABNORMAL HIGH (ref 0.44–1.00)
Creatinine, Ser: 0.96 mg/dL (ref 0.44–1.00)
GFR, Estimated: 40 mL/min — ABNORMAL LOW (ref >60)
GFR, Estimated: >60 mL/min (ref >60)
Glucose, Bld: 97 mg/dL (ref 70–99)
Glucose, Bld: 137 mg/dL — ABNORMAL HIGH (ref 70–99)
Potassium: 4 mmol/L (ref 3.5–5.1)
Potassium: 3.7 mmol/L (ref 3.5–5.1)
Sodium: 139 mmol/L (ref 135–145)
Sodium: 139 mmol/L (ref 135–145)

## 2020-12-04 LAB — COOXEMETRY PANEL
Carboxyhemoglobin: 1.5 % (ref 0.5–1.5)
Methemoglobin: 1.1 % (ref 0.0–1.5)
O2 Saturation: 72.4 %
Total hemoglobin: 10.8 g/dL — ABNORMAL LOW (ref 12.0–16.0)

## 2020-12-04 LAB — POCT I-STAT, CHEM 8
BUN: 19 mg/dL (ref 8–23)
BUN: 17 mg/dL (ref 8–23)
BUN: 16 mg/dL (ref 8–23)
BUN: 17 mg/dL (ref 8–23)
Calcium, Ion: 1.32 mmol/L (ref 1.15–1.40)
Calcium, Ion: 1.27 mmol/L (ref 1.15–1.40)
Calcium, Ion: 1.11 mmol/L — ABNORMAL LOW (ref 1.15–1.40)
Calcium, Ion: 1.1 mmol/L — ABNORMAL LOW (ref 1.15–1.40)
Chloride: 108 mmol/L (ref 98–111)
Chloride: 107 mmol/L (ref 98–111)
Chloride: 105 mmol/L (ref 98–111)
Chloride: 103 mmol/L (ref 98–111)
Creatinine, Ser: 0.9 mg/dL (ref 0.44–1.00)
Creatinine, Ser: 0.9 mg/dL (ref 0.44–1.00)
Creatinine, Ser: 0.8 mg/dL (ref 0.44–1.00)
Creatinine, Ser: 0.9 mg/dL (ref 0.44–1.00)
Glucose, Bld: 92 mg/dL (ref 70–99)
Glucose, Bld: 105 mg/dL — ABNORMAL HIGH (ref 70–99)
Glucose, Bld: 95 mg/dL (ref 70–99)
Glucose, Bld: 125 mg/dL — ABNORMAL HIGH (ref 70–99)
HCT: 33 % — ABNORMAL LOW (ref 36.0–46.0)
HCT: 26 % — ABNORMAL LOW (ref 36.0–46.0)
HCT: 23 % — ABNORMAL LOW (ref 36.0–46.0)
HCT: 25 % — ABNORMAL LOW (ref 36.0–46.0)
Hemoglobin: 11.2 g/dL — ABNORMAL LOW (ref 12.0–15.0)
Hemoglobin: 8.8 g/dL — ABNORMAL LOW (ref 12.0–15.0)
Hemoglobin: 7.8 g/dL — ABNORMAL LOW (ref 12.0–15.0)
Hemoglobin: 8.5 g/dL — ABNORMAL LOW (ref 12.0–15.0)
Potassium: 4.1 mmol/L (ref 3.5–5.1)
Potassium: 3.8 mmol/L (ref 3.5–5.1)
Potassium: 4.7 mmol/L (ref 3.5–5.1)
Potassium: 5.5 mmol/L — ABNORMAL HIGH (ref 3.5–5.1)
Sodium: 141 mmol/L (ref 135–145)
Sodium: 139 mmol/L (ref 135–145)
Sodium: 137 mmol/L (ref 135–145)
Sodium: 137 mmol/L (ref 135–145)
TCO2: 24 mmol/L (ref 22–32)
TCO2: 23 mmol/L (ref 22–32)
TCO2: 23 mmol/L (ref 22–32)
TCO2: 24 mmol/L (ref 22–32)

## 2020-12-04 LAB — HEPARIN LEVEL (UNFRACTIONATED): Heparin Unfractionated: 0.31 IU/mL (ref 0.30–0.70)

## 2020-12-04 LAB — CBC
HCT: 34 % — ABNORMAL LOW (ref 36.0–46.0)
HCT: 36.7 % (ref 36.0–46.0)
HCT: 33 % — ABNORMAL LOW (ref 36.0–46.0)
Hemoglobin: 10.9 g/dL — ABNORMAL LOW (ref 12.0–15.0)
Hemoglobin: 11.9 g/dL — ABNORMAL LOW (ref 12.0–15.0)
Hemoglobin: 10.7 g/dL — ABNORMAL LOW (ref 12.0–15.0)
MCH: 27.7 pg (ref 26.0–34.0)
MCH: 27.7 pg (ref 26.0–34.0)
MCH: 27.8 pg (ref 26.0–34.0)
MCHC: 32.1 g/dL (ref 30.0–36.0)
MCHC: 32.4 g/dL (ref 30.0–36.0)
MCHC: 32.4 g/dL (ref 30.0–36.0)
MCV: 86.5 fL (ref 80.0–100.0)
MCV: 85.3 fL (ref 80.0–100.0)
MCV: 85.7 fL (ref 80.0–100.0)
Platelets: 203 10*3/uL (ref 150–400)
Platelets: 131 10*3/uL — ABNORMAL LOW (ref 150–400)
Platelets: 127 10*3/uL — ABNORMAL LOW (ref 150–400)
RBC: 3.93 MIL/uL (ref 3.87–5.11)
RBC: 4.3 MIL/uL (ref 3.87–5.11)
RBC: 3.85 MIL/uL — ABNORMAL LOW (ref 3.87–5.11)
RDW: 13.3 % (ref 11.5–15.5)
RDW: 13.6 % (ref 11.5–15.5)
RDW: 13.7 % (ref 11.5–15.5)
WBC: 5.8 10*3/uL (ref 4.0–10.5)
WBC: 7.8 10*3/uL (ref 4.0–10.5)
WBC: 8 10*3/uL (ref 4.0–10.5)
nRBC: 0 % (ref 0.0–0.2)
nRBC: 0 % (ref 0.0–0.2)
nRBC: 0 % (ref 0.0–0.2)

## 2020-12-04 LAB — HEMOGLOBIN AND HEMATOCRIT, BLOOD
HCT: 29.7 % — ABNORMAL LOW (ref 36.0–46.0)
Hemoglobin: 9.8 g/dL — ABNORMAL LOW (ref 12.0–15.0)

## 2020-12-04 LAB — PREPARE RBC (CROSSMATCH)

## 2020-12-04 LAB — POCT I-STAT EG7
Acid-base deficit: 2 mmol/L (ref 0.0–2.0)
Bicarbonate: 21.9 mmol/L (ref 20.0–28.0)
Calcium, Ion: 1.08 mmol/L — ABNORMAL LOW (ref 1.15–1.40)
HCT: 24 % — ABNORMAL LOW (ref 36.0–46.0)
Hemoglobin: 8.2 g/dL — ABNORMAL LOW (ref 12.0–15.0)
O2 Saturation: 81 %
Potassium: 4 mmol/L (ref 3.5–5.1)
Sodium: 141 mmol/L (ref 135–145)
TCO2: 23 mmol/L (ref 22–32)
pCO2, Ven: 34.7 mmHg — ABNORMAL LOW (ref 44.0–60.0)
pH, Ven: 7.409 (ref 7.250–7.430)
pO2, Ven: 44 mmHg (ref 32.0–45.0)

## 2020-12-04 LAB — GLUCOSE, CAPILLARY
Glucose-Capillary: 116 mg/dL — ABNORMAL HIGH (ref 70–99)
Glucose-Capillary: 122 mg/dL — ABNORMAL HIGH (ref 70–99)
Glucose-Capillary: 130 mg/dL — ABNORMAL HIGH (ref 70–99)
Glucose-Capillary: 131 mg/dL — ABNORMAL HIGH (ref 70–99)
Glucose-Capillary: 132 mg/dL — ABNORMAL HIGH (ref 70–99)
Glucose-Capillary: 136 mg/dL — ABNORMAL HIGH (ref 70–99)
Glucose-Capillary: 139 mg/dL — ABNORMAL HIGH (ref 70–99)
Glucose-Capillary: 144 mg/dL — ABNORMAL HIGH (ref 70–99)
Glucose-Capillary: 166 mg/dL — ABNORMAL HIGH (ref 70–99)

## 2020-12-04 LAB — ECHO INTRAOPERATIVE TEE
Height: 66 in
Weight: 3206.4 oz

## 2020-12-04 LAB — PLATELET COUNT: Platelets: 160 10*3/uL (ref 150–400)

## 2020-12-04 LAB — APTT: aPTT: 30 seconds (ref 24–36)

## 2020-12-04 LAB — MAGNESIUM: Magnesium: 3.5 mg/dL — ABNORMAL HIGH (ref 1.7–2.4)

## 2020-12-04 LAB — PROTIME-INR
INR: 1.3 — ABNORMAL HIGH (ref 0.8–1.2)
Prothrombin Time: 16 seconds — ABNORMAL HIGH (ref 11.4–15.2)

## 2020-12-04 SURGERY — CORONARY ARTERY BYPASS GRAFTING (CABG)
Anesthesia: General | Site: Chest

## 2020-12-04 MED ORDER — ACETAMINOPHEN 160 MG/5ML PO SOLN: 1000.0000 mg | Freq: Four times a day (QID) | ORAL | Status: AC

## 2020-12-04 MED ORDER — 0.9 % SODIUM CHLORIDE (POUR BTL) OPTIME
TOPICAL | Status: DC | PRN
  Administered 2020-12-04: 4000 mL

## 2020-12-04 MED ORDER — MAGNESIUM SULFATE 4 GM/100ML IV SOLN
4.0000 g | Freq: Once | INTRAVENOUS | Status: AC
  Administered 2020-12-04: 4 g via INTRAVENOUS
  Filled 2020-12-04: qty 100

## 2020-12-04 MED ORDER — DIPHENHYDRAMINE HCL 50 MG/ML IJ SOLN
INTRAMUSCULAR | Status: DC | PRN
  Administered 2020-12-04: 25 mg via INTRAVENOUS

## 2020-12-04 MED ORDER — MORPHINE SULFATE (PF) 2 MG/ML IV SOLN: 1.0000 mg | INTRAVENOUS | Status: DC | PRN

## 2020-12-04 MED ORDER — PROPOFOL 10 MG/ML IV BOLUS
INTRAVENOUS | Status: AC
  Filled 2020-12-04: qty 20

## 2020-12-04 MED ORDER — ASPIRIN 81 MG PO CHEW
324.0000 mg | CHEWABLE_TABLET | Freq: Every day | ORAL | Status: DC
  Administered 2020-12-07: 324 mg
  Filled 2020-12-04: qty 4

## 2020-12-04 MED ORDER — SODIUM CHLORIDE 0.9 % IV SOLN: 250.0000 mL | INTRAVENOUS | Status: DC

## 2020-12-04 MED ORDER — CHLORHEXIDINE GLUCONATE 0.12 % MT SOLN
15.0000 mL | OROMUCOSAL | Status: AC
  Administered 2020-12-04: 15 mL via OROMUCOSAL

## 2020-12-04 MED ORDER — MIDAZOLAM HCL 2 MG/2ML IJ SOLN: 2.0000 mg | INTRAMUSCULAR | Status: DC | PRN

## 2020-12-04 MED ORDER — SODIUM CHLORIDE 0.9% FLUSH
3.0000 mL | Freq: Two times a day (BID) | INTRAVENOUS | Status: DC
  Administered 2020-12-05: 3 mL via INTRAVENOUS

## 2020-12-04 MED ORDER — FENTANYL CITRATE (PF) 100 MCG/2ML IJ SOLN
INTRAMUSCULAR | Status: DC | PRN
  Administered 2020-12-04: 150 ug via INTRAVENOUS
  Administered 2020-12-04: 50 ug via INTRAVENOUS
  Administered 2020-12-04: 150 ug via INTRAVENOUS
  Administered 2020-12-04: 100 ug via INTRAVENOUS
  Administered 2020-12-04: 200 ug via INTRAVENOUS
  Administered 2020-12-04: 100 ug via INTRAVENOUS
  Administered 2020-12-04: 50 ug via INTRAVENOUS
  Administered 2020-12-04: 100 ug via INTRAVENOUS

## 2020-12-04 MED ORDER — ACETAMINOPHEN 500 MG PO TABS
1000.0000 mg | ORAL_TABLET | Freq: Four times a day (QID) | ORAL | Status: AC
  Administered 2020-12-05 – 2020-12-09 (×13): 1000 mg via ORAL
  Filled 2020-12-04 (×15): qty 2

## 2020-12-04 MED ORDER — STERILE WATER FOR INJECTION IV SOLN
INTRAVENOUS | Status: DC | PRN
  Administered 2020-12-04: 30 mL

## 2020-12-04 MED ORDER — ACETAMINOPHEN 650 MG RE SUPP
650.0000 mg | Freq: Once | RECTAL | Status: AC
  Administered 2020-12-04: 650 mg via RECTAL

## 2020-12-04 MED ORDER — ORAL CARE MOUTH RINSE
15.0000 mL | Freq: Two times a day (BID) | OROMUCOSAL | Status: DC
  Administered 2020-12-05 – 2020-12-11 (×7): 15 mL via OROMUCOSAL

## 2020-12-04 MED ORDER — PLASMA-LYTE A IV SOLN
INTRAVENOUS | Status: DC | PRN
  Administered 2020-12-04: 1000 mL via INTRAVASCULAR

## 2020-12-04 MED ORDER — BISACODYL 10 MG RE SUPP: 10.0000 mg | Freq: Every day | RECTAL | Status: DC

## 2020-12-04 MED ORDER — LACTATED RINGERS IV SOLN: INTRAVENOUS | Status: DC

## 2020-12-04 MED ORDER — VANCOMYCIN HCL 1000 MG IV SOLR
INTRAVENOUS | Status: AC
  Filled 2020-12-04: qty 3000

## 2020-12-04 MED ORDER — NITROGLYCERIN IN D5W 200-5 MCG/ML-% IV SOLN: 0.0000 ug/min | INTRAVENOUS | Status: DC

## 2020-12-04 MED ORDER — KETOROLAC TROMETHAMINE 15 MG/ML IJ SOLN
15.0000 mg | Freq: Once | INTRAMUSCULAR | Status: AC
  Administered 2020-12-04: 15 mg via INTRAVENOUS
  Filled 2020-12-04: qty 1

## 2020-12-04 MED ORDER — HEMOSTATIC AGENTS (NO CHARGE) OPTIME
TOPICAL | Status: DC | PRN
  Administered 2020-12-04 (×3): 1 via TOPICAL

## 2020-12-04 MED ORDER — FAMOTIDINE IN NACL 20-0.9 MG/50ML-% IV SOLN
20.0000 mg | Freq: Two times a day (BID) | INTRAVENOUS | Status: AC
  Administered 2020-12-04: 20 mg via INTRAVENOUS
  Filled 2020-12-04 (×2): qty 50

## 2020-12-04 MED ORDER — MIDAZOLAM HCL (PF) 5 MG/ML IJ SOLN
INTRAMUSCULAR | Status: DC | PRN
  Administered 2020-12-04: 2 mg via INTRAVENOUS
  Administered 2020-12-04: 4 mg via INTRAVENOUS
  Administered 2020-12-04 (×2): 2 mg via INTRAVENOUS

## 2020-12-04 MED ORDER — POTASSIUM CHLORIDE 10 MEQ/50ML IV SOLN: 10.0000 meq | INTRAVENOUS | Status: AC

## 2020-12-04 MED ORDER — INSULIN REGULAR(HUMAN) IN NACL 100-0.9 UT/100ML-% IV SOLN: INTRAVENOUS | Status: DC

## 2020-12-04 MED ORDER — DEXMEDETOMIDINE HCL IN NACL 400 MCG/100ML IV SOLN: 0.0000 ug/kg/h | INTRAVENOUS | Status: DC

## 2020-12-04 MED ORDER — SODIUM CHLORIDE 0.9% FLUSH
10.0000 mL | Freq: Two times a day (BID) | INTRAVENOUS | Status: DC
  Administered 2020-12-04 – 2020-12-10 (×8): 10 mL

## 2020-12-04 MED ORDER — PLASMA-LYTE A IV SOLN: INTRAVENOUS | Status: DC

## 2020-12-04 MED ORDER — LACTATED RINGERS IV SOLN: INTRAVENOUS | Status: DC | PRN

## 2020-12-04 MED ORDER — PROPOFOL 10 MG/ML IV BOLUS
INTRAVENOUS | Status: DC | PRN
  Administered 2020-12-04 (×2): 20 mg via INTRAVENOUS
  Administered 2020-12-04: 50 mg via INTRAVENOUS

## 2020-12-04 MED ORDER — VANCOMYCIN HCL IN DEXTROSE 1-5 GM/200ML-% IV SOLN
1000.0000 mg | Freq: Once | INTRAVENOUS | Status: AC
  Administered 2020-12-04: 1000 mg via INTRAVENOUS
  Filled 2020-12-04: qty 200

## 2020-12-04 MED ORDER — DEXTROSE 50 % IV SOLN: 0.0000 mL | INTRAVENOUS | Status: DC | PRN

## 2020-12-04 MED ORDER — OXYCODONE HCL 5 MG PO TABS: 5.0000 mg | ORAL_TABLET | ORAL | Status: DC | PRN

## 2020-12-04 MED ORDER — FENTANYL CITRATE (PF) 250 MCG/5ML IJ SOLN
INTRAMUSCULAR | Status: AC
  Filled 2020-12-04: qty 25

## 2020-12-04 MED ORDER — PROTAMINE SULFATE 10 MG/ML IV SOLN
INTRAVENOUS | Status: DC | PRN
  Administered 2020-12-04: 280 mg via INTRAVENOUS

## 2020-12-04 MED ORDER — METOPROLOL TARTRATE 12.5 MG HALF TABLET
12.5000 mg | ORAL_TABLET | Freq: Two times a day (BID) | ORAL | Status: DC
  Administered 2020-12-05 – 2020-12-10 (×11): 12.5 mg via ORAL
  Filled 2020-12-04 (×12): qty 1

## 2020-12-04 MED ORDER — METOPROLOL TARTRATE 25 MG/10 ML ORAL SUSPENSION
12.5000 mg | Freq: Two times a day (BID) | ORAL | Status: DC
  Administered 2020-12-04: 12.5 mg
  Filled 2020-12-04 (×9): qty 5

## 2020-12-04 MED ORDER — PHENYLEPHRINE HCL-NACL 20-0.9 MG/250ML-% IV SOLN: 0.0000 ug/min | INTRAVENOUS | Status: DC

## 2020-12-04 MED ORDER — PANTOPRAZOLE SODIUM 40 MG PO TBEC
40.0000 mg | DELAYED_RELEASE_TABLET | Freq: Every day | ORAL | Status: DC
  Administered 2020-12-06 – 2020-12-11 (×6): 40 mg via ORAL
  Filled 2020-12-04 (×6): qty 1

## 2020-12-04 MED ORDER — LEVALBUTEROL HCL 0.63 MG/3ML IN NEBU: 0.6300 mg | INHALATION_SOLUTION | Freq: Three times a day (TID) | RESPIRATORY_TRACT | Status: DC | PRN

## 2020-12-04 MED ORDER — ACETAMINOPHEN 160 MG/5ML PO SOLN: 650.0000 mg | Freq: Once | ORAL | Status: AC

## 2020-12-04 MED ORDER — ASPIRIN EC 325 MG PO TBEC
325.0000 mg | DELAYED_RELEASE_TABLET | Freq: Every day | ORAL | Status: DC
  Administered 2020-12-05 – 2020-12-08 (×3): 325 mg via ORAL
  Filled 2020-12-04 (×4): qty 1

## 2020-12-04 MED ORDER — CEFAZOLIN SODIUM-DEXTROSE 2-4 GM/100ML-% IV SOLN
2.0000 g | Freq: Three times a day (TID) | INTRAVENOUS | Status: AC
  Administered 2020-12-04 – 2020-12-06 (×6): 2 g via INTRAVENOUS
  Filled 2020-12-04 (×6): qty 100

## 2020-12-04 MED ORDER — BUPIVACAINE LIPOSOME 1.3 % IJ SUSP
INTRAMUSCULAR | Status: AC
  Filled 2020-12-04: qty 20

## 2020-12-04 MED ORDER — ONDANSETRON HCL 4 MG/2ML IJ SOLN
4.0000 mg | Freq: Four times a day (QID) | INTRAMUSCULAR | Status: DC | PRN
  Administered 2020-12-04 – 2020-12-06 (×7): 4 mg via INTRAVENOUS
  Filled 2020-12-04 (×7): qty 2

## 2020-12-04 MED ORDER — DOCUSATE SODIUM 100 MG PO CAPS
200.0000 mg | ORAL_CAPSULE | Freq: Every day | ORAL | Status: DC
  Administered 2020-12-05 – 2020-12-11 (×6): 200 mg via ORAL
  Filled 2020-12-04 (×6): qty 2

## 2020-12-04 MED ORDER — BUPIVACAINE LIPOSOME 1.3 % IJ SUSP
INTRAMUSCULAR | Status: DC | PRN
  Administered 2020-12-04: 50 mL

## 2020-12-04 MED ORDER — SODIUM CHLORIDE 0.9% FLUSH
3.0000 mL | INTRAVENOUS | Status: DC | PRN
  Administered 2020-12-06: 3 mL via INTRAVENOUS

## 2020-12-04 MED ORDER — ANTITHROMBIN III (HUMAN) 500 UNITS IV SOLR
INTRAVENOUS | Status: DC | PRN
  Administered 2020-12-04: 500 [IU] via INTRAVENOUS

## 2020-12-04 MED ORDER — ALBUMIN HUMAN 5 % IV SOLN
250.0000 mL | INTRAVENOUS | Status: AC | PRN
  Administered 2020-12-04 (×4): 12.5 g via INTRAVENOUS
  Filled 2020-12-04 (×2): qty 250

## 2020-12-04 MED ORDER — THROMBIN 5000 UNITS EX SOLR: CUTANEOUS | Status: DC | PRN

## 2020-12-04 MED ORDER — BUPIVACAINE HCL (PF) 0.5 % IJ SOLN
INTRAMUSCULAR | Status: AC
  Filled 2020-12-04: qty 30

## 2020-12-04 MED ORDER — ARTIFICIAL TEARS OPHTHALMIC OINT
TOPICAL_OINTMENT | OPHTHALMIC | Status: DC | PRN
  Administered 2020-12-04: 1 via OPHTHALMIC

## 2020-12-04 MED ORDER — BISACODYL 5 MG PO TBEC
10.0000 mg | DELAYED_RELEASE_TABLET | Freq: Every day | ORAL | Status: DC
  Administered 2020-12-05 – 2020-12-11 (×5): 10 mg via ORAL
  Filled 2020-12-04 (×5): qty 2

## 2020-12-04 MED ORDER — LACTATED RINGERS IV SOLN: 500.0000 mL | Freq: Once | INTRAVENOUS | Status: DC | PRN

## 2020-12-04 MED ORDER — METOPROLOL TARTRATE 5 MG/5ML IV SOLN
2.5000 mg | INTRAVENOUS | Status: DC | PRN
  Administered 2020-12-04: 5 mg via INTRAVENOUS
  Filled 2020-12-04: qty 5

## 2020-12-04 MED ORDER — THROMBIN 5000 UNITS EX SOLR
INTRAVENOUS | Status: DC | PRN
  Administered 2020-12-04: 15 mL

## 2020-12-04 MED ORDER — VANCOMYCIN HCL 1000 MG IV SOLR
INTRAVENOUS | Status: DC | PRN
  Administered 2020-12-04 (×3): 1000 mg

## 2020-12-04 MED ORDER — ROCURONIUM BROMIDE 10 MG/ML (PF) SYRINGE
PREFILLED_SYRINGE | INTRAVENOUS | Status: DC | PRN
  Administered 2020-12-04: 100 mg via INTRAVENOUS
  Administered 2020-12-04: 20 mg via INTRAVENOUS
  Administered 2020-12-04: 50 mg via INTRAVENOUS

## 2020-12-04 MED ORDER — TRAMADOL HCL 50 MG PO TABS
50.0000 mg | ORAL_TABLET | ORAL | Status: DC | PRN
  Administered 2020-12-05 – 2020-12-08 (×3): 50 mg via ORAL
  Filled 2020-12-04 (×3): qty 1

## 2020-12-04 MED ORDER — SODIUM CHLORIDE 0.9% FLUSH: 10.0000 mL | INTRAVENOUS | Status: DC | PRN

## 2020-12-04 MED ORDER — SODIUM CHLORIDE 0.9 % IV SOLN: INTRAVENOUS | Status: DC

## 2020-12-04 MED ORDER — HEPARIN SODIUM (PORCINE) 1000 UNIT/ML IJ SOLN
INTRAMUSCULAR | Status: DC | PRN
  Administered 2020-12-04: 28000 [IU] via INTRAVENOUS
  Administered 2020-12-04: 5000 [IU] via INTRAVENOUS

## 2020-12-04 MED ORDER — CHLORHEXIDINE GLUCONATE CLOTH 2 % EX PADS
6.0000 | MEDICATED_PAD | Freq: Every day | CUTANEOUS | Status: DC
  Administered 2020-12-04 – 2020-12-06 (×3): 6 via TOPICAL

## 2020-12-04 MED ORDER — STERILE WATER FOR INJECTION IJ SOLN
INTRAMUSCULAR | Status: AC
  Filled 2020-12-04: qty 10

## 2020-12-04 MED ORDER — SODIUM BICARBONATE 8.4 % IV SOLN
100.0000 meq | Freq: Once | INTRAVENOUS | Status: AC
  Administered 2020-12-04: 100 meq via INTRAVENOUS

## 2020-12-04 MED ORDER — MILRINONE LACTATE IN DEXTROSE 20-5 MG/100ML-% IV SOLN
0.2500 ug/kg/min | INTRAVENOUS | Status: DC
  Administered 2020-12-04 – 2020-12-05 (×2): 0.25 ug/kg/min via INTRAVENOUS
  Filled 2020-12-04: qty 100

## 2020-12-04 MED ORDER — SODIUM CHLORIDE 0.45 % IV SOLN: INTRAVENOUS | Status: DC | PRN

## 2020-12-04 MED ORDER — MIDAZOLAM HCL (PF) 10 MG/2ML IJ SOLN
INTRAMUSCULAR | Status: AC
  Filled 2020-12-04: qty 2

## 2020-12-04 MED ORDER — ACETAMINOPHEN 10 MG/ML IV SOLN
1000.0000 mg | Freq: Four times a day (QID) | INTRAVENOUS | Status: AC
  Administered 2020-12-04 – 2020-12-05 (×4): 1000 mg via INTRAVENOUS
  Filled 2020-12-04 (×4): qty 100

## 2020-12-04 MED ORDER — EPHEDRINE SULFATE-NACL 50-0.9 MG/10ML-% IV SOSY
PREFILLED_SYRINGE | INTRAVENOUS | Status: DC | PRN
  Administered 2020-12-04: 5 mg via INTRAVENOUS

## 2020-12-04 MED FILL — Magnesium Sulfate Inj 50%: INTRAMUSCULAR | Qty: 10 | Status: AC

## 2020-12-04 MED FILL — Potassium Chloride Inj 2 mEq/ML: INTRAVENOUS | Qty: 40 | Status: AC

## 2020-12-04 MED FILL — Heparin Sodium (Porcine) Inj 1000 Unit/ML: INTRAMUSCULAR | Qty: 30 | Status: AC

## 2020-12-04 SURGICAL SUPPLY — 114 items
ADAPTER CARDIO PERF ANTE/RETRO (ADAPTER) ×5 IMPLANT
ADH SKN CLS APL DERMABOND .7 (GAUZE/BANDAGES/DRESSINGS) ×4
ADPR PRFSN 84XANTGRD RTRGD (ADAPTER) ×4
APL SRG 7X2 LUM MLBL SLNT (VASCULAR PRODUCTS)
APPLICATOR TIP COSEAL (VASCULAR PRODUCTS) IMPLANT
APPLIER CLIP 9.375 SM OPEN (CLIP)
APR CLP SM 9.3 20 MLT OPN (CLIP)
ATRICLIP EXCLUSION VLAA 35 (Miscellaneous) ×5 IMPLANT
BAG DECANTER FOR FLEXI CONT (MISCELLANEOUS) ×5 IMPLANT
BLADE CLIPPER SURG (BLADE) ×5 IMPLANT
BLADE STERNUM SYSTEM 6 (BLADE) ×5 IMPLANT
BLADE SURG 15 STRL LF DISP TIS (BLADE) ×4 IMPLANT
BLADE SURG 15 STRL SS (BLADE) ×5
BNDG ELASTIC 4X5.8 VLCR STR LF (GAUZE/BANDAGES/DRESSINGS) ×5 IMPLANT
BNDG ELASTIC 6X5.8 VLCR STR LF (GAUZE/BANDAGES/DRESSINGS) ×5 IMPLANT
BNDG GAUZE ELAST 4 BULKY (GAUZE/BANDAGES/DRESSINGS) ×5 IMPLANT
CANISTER SUCT 3000ML PPV (MISCELLANEOUS) ×5 IMPLANT
CATH CPB KIT HENDRICKSON (MISCELLANEOUS) ×5 IMPLANT
CATH ROBINSON RED A/P 18FR (CATHETERS) ×10 IMPLANT
CLIP APPLIE 9.375 SM OPEN (CLIP) ×4 IMPLANT
CLIP RETRACTION 3.0MM CORONARY (MISCELLANEOUS) ×5 IMPLANT
CLIP VESOCCLUDE MED 24/CT (CLIP) IMPLANT
CLIP VESOCCLUDE SM WIDE 24/CT (CLIP) ×12 IMPLANT
CONTAINER PROTECT SURGISLUSH (MISCELLANEOUS) ×5 IMPLANT
COVER MAYO STAND STRL (DRAPES) ×4 IMPLANT
CUFF TOURN SGL QUICK 18X4 (TOURNIQUET CUFF) IMPLANT
CUFF TOURN SGL QUICK 24 (TOURNIQUET CUFF)
CUFF TRNQT CYL 24X4X16.5-23 (TOURNIQUET CUFF) IMPLANT
DERMABOND ADVANCED (GAUZE/BANDAGES/DRESSINGS) ×1
DERMABOND ADVANCED .7 DNX12 (GAUZE/BANDAGES/DRESSINGS) ×4 IMPLANT
DEVICE EXCLUSIN ATRCLP VLAA 35 (Miscellaneous) IMPLANT
DRAIN CHANNEL 28F RND 3/8 FF (WOUND CARE) ×15 IMPLANT
DRAPE CARDIOVASCULAR INCISE (DRAPES) ×5
DRAPE EXTREMITY T 121X128X90 (DISPOSABLE) ×5 IMPLANT
DRAPE HALF SHEET 40X57 (DRAPES) ×5 IMPLANT
DRAPE SRG 135X102X78XABS (DRAPES) ×4 IMPLANT
DRAPE WARM FLUID 44X44 (DRAPES) ×5 IMPLANT
DRSG AQUACEL AG ADV 3.5X14 (GAUZE/BANDAGES/DRESSINGS) ×5 IMPLANT
ELECT CAUTERY BLADE 6.4 (BLADE) ×5 IMPLANT
ELECT REM PT RETURN 9FT ADLT (ELECTROSURGICAL) ×10
ELECTRODE REM PT RTRN 9FT ADLT (ELECTROSURGICAL) ×8 IMPLANT
FELT TEFLON 1X6 (MISCELLANEOUS) ×9 IMPLANT
GAUZE SPONGE 4X4 12PLY STRL (GAUZE/BANDAGES/DRESSINGS) ×10 IMPLANT
GAUZE SPONGE 4X4 12PLY STRL LF (GAUZE/BANDAGES/DRESSINGS) ×2 IMPLANT
GEL ULTRASOUND 20GR AQUASONIC (MISCELLANEOUS) ×4 IMPLANT
GLOVE NEODERM STRL 7.5  LF PF (GLOVE)
GLOVE NEODERM STRL 7.5 LF PF (GLOVE) ×12 IMPLANT
GLOVE SURG NEODERM 7.5  LF PF (GLOVE)
GOWN STRL REUS W/ TWL LRG LVL3 (GOWN DISPOSABLE) ×16 IMPLANT
GOWN STRL REUS W/TWL LRG LVL3 (GOWN DISPOSABLE) ×40
HEMOSTAT POWDER SURGIFOAM 1G (HEMOSTASIS) ×8 IMPLANT
INSERT FOGARTY XLG (MISCELLANEOUS) ×5 IMPLANT
INSERT SUTURE HOLDER (MISCELLANEOUS) ×5 IMPLANT
KIT APPLICATOR RATIO 11:1 (KITS) ×1 IMPLANT
KIT BASIN OR (CUSTOM PROCEDURE TRAY) ×5 IMPLANT
KIT SUCTION CATH 14FR (SUCTIONS) ×5 IMPLANT
KIT TURNOVER KIT B (KITS) ×5 IMPLANT
KIT VASOVIEW HEMOPRO 2 VH 4000 (KITS) ×5 IMPLANT
LINE EXTENSION DELIVERY (MISCELLANEOUS) ×1 IMPLANT
MARKER GRAFT CORONARY BYPASS (MISCELLANEOUS) ×12 IMPLANT
NDL 18GX1X1/2 (RX/OR ONLY) (NEEDLE) ×4 IMPLANT
NEEDLE 18GX1X1/2 (RX/OR ONLY) (NEEDLE) ×5 IMPLANT
NS IRRIG 1000ML POUR BTL (IV SOLUTION) ×24 IMPLANT
PACK E OPEN HEART (SUTURE) ×5 IMPLANT
PACK OPEN HEART (CUSTOM PROCEDURE TRAY) ×5 IMPLANT
PACK PLATELET PROCEDURE 60 (MISCELLANEOUS) ×1 IMPLANT
PACK SPY-PHI (KITS) ×1 IMPLANT
PAD ARMBOARD 7.5X6 YLW CONV (MISCELLANEOUS) ×10 IMPLANT
PAD ELECT DEFIB RADIOL ZOLL (MISCELLANEOUS) ×5 IMPLANT
PENCIL BUTTON HOLSTER BLD 10FT (ELECTRODE) ×5 IMPLANT
POSITIONER HEAD DONUT 9IN (MISCELLANEOUS) ×5 IMPLANT
POWDER SURGICEL 3.0 GRAM (HEMOSTASIS) ×6 IMPLANT
PUNCH AORTIC ROTATE 4.0MM (MISCELLANEOUS) ×1 IMPLANT
SEALANT SURG COSEAL 4ML (VASCULAR PRODUCTS) ×4 IMPLANT
SEALANT SURG COSEAL 8ML (VASCULAR PRODUCTS) ×1 IMPLANT
SET MPS 3-ND DEL (MISCELLANEOUS) ×1 IMPLANT
SHEARS HARMONIC 9CM CVD (BLADE) ×1 IMPLANT
SUT BONE WAX W31G (SUTURE) ×5 IMPLANT
SUT MNCRL AB 3-0 PS2 18 (SUTURE) ×10 IMPLANT
SUT PDS AB 1 CTX 36 (SUTURE) ×10 IMPLANT
SUT PROLENE 3 0 SH DA (SUTURE) ×5 IMPLANT
SUT PROLENE 4 0 SH DA (SUTURE) ×1 IMPLANT
SUT PROLENE 5 0 C 1 36 (SUTURE) IMPLANT
SUT PROLENE 6 0 C 1 30 (SUTURE) ×15 IMPLANT
SUT PROLENE 8 0 BV175 6 (SUTURE) IMPLANT
SUT PROLENE BLUE 7 0 (SUTURE) ×5 IMPLANT
SUT SILK  1 MH (SUTURE) ×10
SUT SILK 1 MH (SUTURE) IMPLANT
SUT SILK 2 0 SH CR/8 (SUTURE) IMPLANT
SUT SILK 3 0 SH CR/8 (SUTURE) IMPLANT
SUT STEEL 6MS V (SUTURE) ×5 IMPLANT
SUT STEEL SZ 6 DBL 3X14 BALL (SUTURE) ×5 IMPLANT
SUT VIC AB 2-0 CT1 27 (SUTURE) ×5
SUT VIC AB 2-0 CT1 TAPERPNT 27 (SUTURE) IMPLANT
SUT VIC AB 2-0 CTX 27 (SUTURE) IMPLANT
SUT VIC AB 3-0 SH 27 (SUTURE)
SUT VIC AB 3-0 SH 27X BRD (SUTURE) IMPLANT
SUT VIC AB 3-0 X1 27 (SUTURE) IMPLANT
SYR 10ML LL (SYRINGE) IMPLANT
SYR 30ML LL (SYRINGE) ×4 IMPLANT
SYR 3ML LL SCALE MARK (SYRINGE) ×4 IMPLANT
SYR 50ML SLIP (SYRINGE) IMPLANT
SYSTEM SAHARA CHEST DRAIN ATS (WOUND CARE) ×5 IMPLANT
TAPE CLOTH SURG 4X10 WHT LF (GAUZE/BANDAGES/DRESSINGS) ×1 IMPLANT
TAPE PAPER 2X10 WHT MICROPORE (GAUZE/BANDAGES/DRESSINGS) ×1 IMPLANT
TIP DUAL SPRAY TOPICAL (TIP) ×2 IMPLANT
TOWEL GREEN STERILE (TOWEL DISPOSABLE) ×5 IMPLANT
TOWEL GREEN STERILE FF (TOWEL DISPOSABLE) ×5 IMPLANT
TRAY FOLEY SLVR 14FR TEMP STAT (SET/KITS/TRAYS/PACK) ×1 IMPLANT
TRAY FOLEY SLVR 16FR TEMP STAT (SET/KITS/TRAYS/PACK) ×4 IMPLANT
TUBING LAP HI FLOW INSUFFLATIO (TUBING) ×4 IMPLANT
UNDERPAD 30X36 HEAVY ABSORB (UNDERPADS AND DIAPERS) ×5 IMPLANT
WATER STERILE IRR 1000ML POUR (IV SOLUTION) ×10 IMPLANT
WATER STERILE IRR 1000ML UROMA (IV SOLUTION) IMPLANT

## 2020-12-04 NOTE — OR Nursing (Signed)
Blood from patient was processed and centrifuged by perfusion to obtain Platelet Rich Plasma (PRP) and Platelet Poor Plasma (PPP). Each product is draw up in a different syringe and transferred to sterile field.  First, a mixture of 24mL of 10% Calcium Chloride Injection 0.5mg /2mL (expires: 05/2022, lot: [id number]) will be combined with all the Thrombin 5000 units powder only (expires: 05/05/2022, lot: [id number]).  The scrub person mixed 40mL of PRP with 23mL of the Thrombin/Calcium Chloride mixture in one applicator syringe.  The scrub person mixed 56mL of PPP with 64mL of the Thrombin/Calcium Chloride mixture in the second applicator syringe. These mixtures are applied to sternal edges.

## 2020-12-04 NOTE — Anesthesia Postprocedure Evaluation (Signed)
Anesthesia Post Note  Patient: Krystal Mcpherson  Procedure(s) Performed: CORONARY ARTERY BYPASS GRAFTING (CABG), ON PUMP, TIMES THREE, USING LEFT INTERNAL MAMMARY ARTERY AND LEFT RADIAL ARTERY (N/A Chest) TRANSESOPHAGEAL ECHOCARDIOGRAM (TEE) (N/A ) INDOCYANINE GREEN FLUORESCENCE IMAGING (ICG) (N/A ) Left RADIAL ARTERY HARVEST (Left Arm Lower) CLIPPING OF LEFT ATRIAL APPENDAGE     Patient location during evaluation: ICU Anesthesia Type: General Level of consciousness: sedated and patient remains intubated per anesthesia plan Pain management: pain level controlled Vital Signs Assessment: post-procedure vital signs reviewed and stable Respiratory status: patient remains intubated per anesthesia plan Cardiovascular status: stable Postop Assessment: no apparent nausea or vomiting Anesthetic complications: no   No complications documented.  Last Vitals:  Vitals:   12/04/20 1400 12/04/20 1415  BP: 111/62   Pulse: 80 80  Resp: 14 12  Temp: (!) 35.9 C (!) 35.7 C  SpO2: 98% 98%    Last Pain:  Vitals:   12/04/20 1400  TempSrc: (P) Core  PainSc:                  Audry Pili

## 2020-12-04 NOTE — H&P (Signed)
History and Physical Interval Note:  12/04/2020 7:42 AM  Krystal Mcpherson  has presented today for surgery, with the diagnosis of CAD.  The various methods of treatment have been discussed with the patient and family. After consideration of risks, benefits and other options for treatment, the patient has consented to  Procedure(s): CORONARY ARTERY BYPASS GRAFTING (CABG) (N/A) TRANSESOPHAGEAL ECHOCARDIOGRAM (TEE) (N/A) INDOCYANINE GREEN FLUORESCENCE IMAGING (ICG) (N/A) possible left RADIAL ARTERY HARVEST (Left) POSSIBLE MAZE (N/A) as a surgical intervention.  The patient's history has been reviewed, patient examined, no change in status, stable for surgery.  I have reviewed the patient's chart and labs.  Questions were answered to the patient's satisfaction.     Wonda Olds

## 2020-12-04 NOTE — Procedures (Signed)
Extubation Procedure Note  Patient Details:   Name: Krystal Mcpherson DOB: [birth year] MRN: [id number]   Airway Documentation:    Vent end date: 12/04/20 Vent end time: 1812   Evaluation  O2 sats: stable throughout Complications: No apparent complications Patient did tolerate procedure well. Bilateral Breath Sounds: Clear   Yes.  Pt extubated to 4 l/m  per protocol.  Earney Navy 12/04/2020, 6:13 PM

## 2020-12-04 NOTE — Progress Notes (Signed)
ABG done earlier in the night lab refused sample, pt stated that she doesn't want to be stuck again and will wait when she is in her procedure to get the sample.

## 2020-12-04 NOTE — Anesthesia Procedure Notes (Signed)
Procedure Name: Intubation Date/Time: 12/04/2020 9:00 AM Performed by: Dorthea Cove, CRNA Pre-anesthesia Checklist: Patient identified, Emergency Drugs available, Suction available and Patient being monitored Patient Re-evaluated:Patient Re-evaluated prior to induction Oxygen Delivery Method: Circle system utilized Preoxygenation: Pre-oxygenation with 100% oxygen Induction Type: IV induction Ventilation: Mask ventilation without difficulty Laryngoscope Size: Mac and 3 Grade View: Grade II Tube type: Oral Tube size: 8.0 mm Number of attempts: 1 Airway Equipment and Method: Stylet and Oral airway Placement Confirmation: ETT inserted through vocal cords under direct vision,  positive ETCO2 and breath sounds checked- equal and bilateral Secured at: 21 cm Tube secured with: Tape Dental Injury: Teeth and Oropharynx as per pre-operative assessment

## 2020-12-04 NOTE — Op Note (Signed)
CARDIOTHORACIC SURGERY OPERATIVE NOTE  Date of Procedure: 12/04/2020  Preoperative Diagnosis: Severe 3-vessel Coronary Artery Disease, status post NSTEMI  Postoperative Diagnosis: Same  Procedure:    Coronary Artery Bypass Grafting x 3  Left Internal Mammary Artery to Distal Left Anterior Descending Coronary Artery; left radial artery graft to  Obtuse Marginal Branch of Left Circumflex Coronary Artery and ramus intermedius coronary artery as a sequenced graft Open left radial artery harvesting Completion graft surveillance with indocyanine green fluorescence angiography  Surgeon: B.  Murvin Natal, MD  Assistant: Josie Saunders, PA-C  Anesthesia: General  Operative Findings:  Mildly reduced left ventricular systolic function  Good quality left internal mammary artery conduit  Good quality left radial artery conduit  Good quality target vessels for grafting    BRIEF CLINICAL NOTE AND INDICATIONS FOR SURGERY  80 year old lady with a history of coronary artery disease status post PCI of the right coronary artery presented with NSTEMI.  She underwent repeat left heart catheterization demonstrating severe disease primarily involving the left coronary circulation.  She is referred for CABG.  Her primary issues preoperatively relate to lack of traditional conduit.  She has had a long history of venous disease in her lower extremities.  She also has a history of Raynaud's.  After evaluation of the upper extremity circulation, it is felt safe to take the left radial artery as a second conduit in addition to the left mammary artery.  She is taken the operating today for CABG.   DETAILS OF THE OPERATIVE PROCEDURE  Preparation:  The patient is brought to the operating room on the above mentioned date and central monitoring was established by the anesthesia team including placement of Swan-Ganz catheter and radial arterial line. The patient is placed in the supine position on the operating  table.  Intravenous antibiotics are administered. General endotracheal anesthesia is induced uneventfully. A Foley catheter is placed.  Baseline transesophageal echocardiogram was performed.  Findings were notable for mildly reduced left ventricular function with hypokinetic lateral wall and mild to moderate mitral regurgitation particularly with elevated blood pressure systemically.  The patient's chest, abdomen, both groins, the left upper extremity, and both lower extremities are prepared and draped in a sterile manner. A time out procedure is performed.   Surgical Approach and Conduit Harvest:  A median sternotomy incision was performed and the left internal mammary artery is dissected from the chest wall and prepared for bypass grafting. The left internal mammary artery is notably good quality conduit. Simultaneously, open left radial artery harvesting is performed.The radial artery is notably good quality conduit.  The left forearm surgical incision is closed in layers and the arm is tucked at the side.. Following systemic heparinization, the left internal mammary artery was transected distally noted to have excellent flow.   Extracorporeal Cardiopulmonary Bypass and Myocardial Protection:  The pericardium is opened. The ascending aorta is nondiseased in appearance. The ascending aorta and the right atrium are cannulated for cardiopulmonary bypass.  Adequate heparinization is verified.     The entire pre-bypass portion of the operation was notable for stable hemodynamics.  Cardiopulmonary bypass was begun and the surface of the heart is inspected. Distal target vessels are selected for coronary artery bypass grafting. A cardioplegia cannula is placed in the ascending aorta.   The patient is allowed to cool passively to 35C systemic temperature.  The aortic cross clamp is applied and cold blood cardioplegia is delivered initially in an antegrade fashion through the aortic root.    Iced  saline slush is applied for topical hypothermia.  The initial cardioplegic arrest is rapid with early diastolic arrest.  Repeat doses of cardioplegia are administered intermittently throughout the entire cross clamp portion of the operation through the aortic root  and through subsequently placed grafts in order to maintain completely flat electrocardiogram.   Coronary Artery Bypass Grafting:   The  obtuse marginal branch of the left circumflex coronary artery and the ramus intermedius coronary artery were grafted using the left radial artery graft in a sequenced fashion.  At the site of distal anastomoses the target vessels were good quality and measured approximately 1.5 mm in diameter.  The distal left anterior coronary artery was grafted with the left internal mammary artery in an end-to-side fashion.  At the site of distal anastomosis the target vessel was good quality and measured approximately 1.5 mm in diameter. Anastomotic patency and runoff was confirmed with indocyanine green fluorescence imaging (SPY).  The proximal radial artery graft anastomosis was placed directly to the ascending aorta prior to removal of the aortic cross clamp.  De-airing procedures were performed and the aortic cross-clamp was removed.   Procedure Completion:  All proximal and distal coronary anastomoses were inspected for hemostasis and appropriate graft orientation. Epicardial pacing wires are fixed to the right ventricular outflow tract and to the right atrial appendage. The patient is rewarmed to 37C temperature. The patient is weaned and disconnected from cardiopulmonary bypass.  The patient's rhythm at separation from bypass was sinus bradycardia.  The patient was weaned from cardiopulmonary bypass with mild inotropic support.   Followup transesophageal echocardiogram performed after separation from bypass revealed no changes from the preoperative exam.  The aortic and venous cannula were removed  uneventfully. Protamine was administered to reverse the anticoagulation. The mediastinum and pleural space were inspected for hemostasis and irrigated with saline solution. The mediastinum and both pleural spaces were drained using fluted chest tubes placed through separate stab incisions inferiorly.  The soft tissues anterior to the aorta were reapproximated loosely. The sternum is closed with double strength sternal wire. The soft tissues anterior to the sternum were closed in multiple layers and the skin is closed with a running subcuticular skin closure.  The post-bypass portion of the operation was notable for stable rhythm and hemodynamics.     Disposition:  The patient tolerated the procedure well and is transported to the surgical intensive care in stable condition. There are no intraoperative complications. All sponge instrument and needle counts are verified correct at completion of the operation.    Jayme Cloud, MD 12/04/2020 3:53 PM

## 2020-12-04 NOTE — Anesthesia Procedure Notes (Signed)
Arterial Line Insertion Start/End6/07/2020 8:00 AM, 12/04/2020 8:10 AM Performed by: Oleta Mouse, MD, anesthesiologist  Patient location: Pre-op. Preanesthetic checklist: patient identified, IV checked, site marked, risks and benefits discussed, surgical consent, monitors and equipment checked, pre-op evaluation, timeout performed and anesthesia consent Lidocaine 1% used for infiltration radial was placed Catheter size: 20 Fr Hand hygiene performed  and maximum sterile barriers used   Attempts: 1 Procedure performed using ultrasound guided technique. Ultrasound Notes:anatomy identified, needle tip was noted to be adjacent to the nerve/plexus identified and no ultrasound evidence of intravascular and/or intraneural injection Following insertion, dressing applied and Biopatch. Post procedure assessment: normal and unchanged  Patient tolerated the procedure well with no immediate complications.

## 2020-12-04 NOTE — Progress Notes (Signed)
Respiratory drew ABG on patient, lab called to say that it was not enough blood, patient request that the ABG be drawn while she is sleep in surgery.  Patient request not to have it redrawn while she was awake because it hurts her so bad.  Wil;l continue to monitor.    Rico Sheehan, RN

## 2020-12-04 NOTE — Progress Notes (Signed)
[facility] BRIEF SICU PROGRESS NOTE  Day of Surgery  S/P Procedure(s) (LRB): CORONARY ARTERY BYPASS GRAFTING (CABG), ON PUMP, TIMES THREE, USING LEFT INTERNAL MAMMARY ARTERY AND LEFT RADIAL ARTERY (N/A) TRANSESOPHAGEAL ECHOCARDIOGRAM (TEE) (N/A) INDOCYANINE GREEN FLUORESCENCE IMAGING (ICG) (N/A) Left RADIAL ARTERY HARVEST (Left) CLIPPING OF LEFT ATRIAL APPENDAGE   Waking up on vent AAI paced w/ stable hemodynamics on low dose milrinone O2 sats 97-98% on 40% FiO2 Chest tube output low UOP adequate  Plan: Continue routine early postop  Rexene Alberts, MD 12/04/2020 5:54 PM

## 2020-12-04 NOTE — Anesthesia Procedure Notes (Signed)
Central Venous Catheter Insertion Performed by: Audry Pili, MD, anesthesiologist Start/End6/07/2020 8:06 AM, 12/04/2020 8:14 AM Patient location: Pre-op. Preanesthetic checklist: patient identified, IV checked, risks and benefits discussed, surgical consent, monitors and equipment checked, pre-op evaluation, timeout performed and anesthesia consent Position: Trendelenburg Lidocaine 1% used for infiltration and patient sedated Hand hygiene performed , maximum sterile barriers used  and Seldinger technique used Catheter size: 8.5 Fr Central line was placed.MAC introducer Procedure performed using ultrasound guided technique. Ultrasound Notes:anatomy identified, needle tip was noted to be adjacent to the nerve/plexus identified, no ultrasound evidence of intravascular and/or intraneural injection and image(s) printed for medical record Attempts: 1 Following insertion, line sutured, dressing applied and Biopatch. Post procedure assessment: blood return through all ports, no air and free fluid flow  Patient tolerated the procedure well with no immediate complications.

## 2020-12-04 NOTE — Anesthesia Procedure Notes (Signed)
Central Venous Catheter Insertion Performed by: Audry Pili, MD, anesthesiologist Start/End6/07/2020 8:14 AM, 12/04/2020 8:16 AM Patient location: Pre-op. Preanesthetic checklist: patient identified, IV checked, risks and benefits discussed, surgical consent, monitors and equipment checked, pre-op evaluation, timeout performed and anesthesia consent Position: Trendelenburg Hand hygiene performed  and maximum sterile barriers used  Total catheter length 10. PA cath was placed.Swan type:thermodilution PA Cath depth:48 Procedure performed without using ultrasound guided technique. Attempts: 1 Patient tolerated the procedure well with no immediate complications.

## 2020-12-04 NOTE — Brief Op Note (Addendum)
11/28/2020 - 12/04/2020  10:29 AM  PATIENT:  Jacquelin Hawking  80 y.o. female  PRE-OPERATIVE DIAGNOSIS:  1. S/p NSTEMI 2. Coronary artery disease 3. Atrial fibrillation  POST-OPERATIVE DIAGNOSIS:  1. S/p NSTEMI 2. Coronary artery disease 3. Atrial fibrillation   PROCEDURE:  TRANSESOPHAGEAL ECHOCARDIOGRAM (TEE), CORONARY ARTERY BYPASS GRAFTING (CABG) x 3 (LIMA to LAD, LEFT RADIAL ARTERY SEQUENTIALLY to OM and RAMUS INTERMEDIATE)  Using  OPEN LEFT RADIAL ARTERY HARVEST and LEFT INTERNAL MAMMARY ARTERY, INDOCYANINE GREEN FLUORESCENCE IMAGING (ICG), and LA CLIP  LEFT RADIAL ARTERY HAVEST: 32 minutes; LEFT RADIAL ARTERY PREP TIME: 7 minutes  SURGEON:  Surgeon(s) and Role:    Wonda Olds, MD - Primary  PHYSICIAN ASSISTANT: Lars Pinks PA-C  ANESTHESIA:   general  EBL:  Per anesthesia, perfusion record  DRAINS: Chest tubes placed in the mediastinal and pleural spaces   LOCAL MEDICATIONS USED:  OTHER Exparel  COUNTS:  YES  DICTATION: .Dragon Dictation  PLAN OF CARE: Admit to inpatient   PATIENT DISPOSITION:  ICU - intubated and hemodynamically stable.   Delay start of Pharmacological VTE agent (>24hrs) due to surgical blood loss or risk of bleeding: yes  BASELINE WEIGHT: 90.9 kg  Agree with documentation. Broadus Z. Orvan Seen, Cando

## 2020-12-04 NOTE — Transfer of Care (Signed)
Immediate Anesthesia Transfer of Care Note  Patient: BRIELLA HOBDAY  Procedure(s) Performed: CORONARY ARTERY BYPASS GRAFTING (CABG), ON PUMP, TIMES THREE, USING LEFT INTERNAL MAMMARY ARTERY AND LEFT RADIAL ARTERY (N/A Chest) TRANSESOPHAGEAL ECHOCARDIOGRAM (TEE) (N/A ) INDOCYANINE GREEN FLUORESCENCE IMAGING (ICG) (N/A ) Left RADIAL ARTERY HARVEST (Left Arm Lower) CLIPPING OF LEFT ATRIAL APPENDAGE  Patient Location: ICU  Anesthesia Type:General  Level of Consciousness: sedated and Patient remains intubated per anesthesia plan  Airway & Oxygen Therapy: Patient remains intubated per anesthesia plan and Patient placed on Ventilator (see vital sign flow sheet for setting)  Post-op Assessment: Report given to RN and Post -op Vital signs reviewed and stable  Post vital signs: Reviewed and stable  Last Vitals:  Vitals Value Taken Time  BP 137/45   Temp 35.9 C 12/04/20 1336  Pulse 80 12/04/20 1336  Resp 19 12/04/20 1336  SpO2 98 % 12/04/20 1336  Vitals shown include unvalidated device data.  Last Pain:  Vitals:   12/04/20 0545  TempSrc: Oral  PainSc:       Patients Stated Pain Goal: 0 (41/58/30 9407)  Complications: No complications documented.

## 2020-12-04 NOTE — Plan of Care (Signed)
  Problem: Education: Goal: Knowledge of General Education information will improve Description: Including pain rating scale, medication(s)/side effects and non-pharmacologic comfort measures Outcome: Progressing   Problem: Health Behavior/Discharge Planning: Goal: Ability to manage health-related needs will improve Outcome: Progressing   Problem: Clinical Measurements: Goal: Ability to maintain clinical measurements within normal limits will improve Outcome: Progressing Goal: Will remain free from infection Outcome: Progressing Goal: Diagnostic test results will improve Outcome: Progressing Goal: Respiratory complications will improve Outcome: Progressing Goal: Cardiovascular complication will be avoided Outcome: Progressing   Problem: Coping: Goal: Level of anxiety will decrease Outcome: Progressing   Problem: Pain Managment: Goal: General experience of comfort will improve Outcome: Progressing   Problem: Safety: Goal: Ability to remain free from injury will improve Outcome: Progressing   Problem: Skin Integrity: Goal: Risk for impaired skin integrity will decrease Outcome: Progressing   Problem: Education: Goal: Understanding of CV disease, CV risk reduction, and recovery process will improve Outcome: Progressing Goal: Individualized Educational Video(s) Outcome: Progressing   Problem: Activity: Goal: Ability to return to baseline activity level will improve Outcome: Progressing   Problem: Cardiovascular: Goal: Ability to achieve and maintain adequate cardiovascular perfusion will improve Outcome: Progressing Goal: Vascular access site(s) Level 0-1 will be maintained Outcome: Progressing   Problem: Health Behavior/Discharge Planning: Goal: Ability to safely manage health-related needs after discharge will improve Outcome: Progressing   Problem: Education: Goal: Will demonstrate proper wound care and an understanding of methods to prevent future  damage Outcome: Progressing Goal: Knowledge of disease or condition will improve Outcome: Progressing Goal: Knowledge of the prescribed therapeutic regimen will improve Outcome: Progressing Goal: Individualized Educational Video(s) Outcome: Progressing   Problem: Activity: Goal: Risk for activity intolerance will decrease Outcome: Progressing   Problem: Cardiac: Goal: Will achieve and/or maintain hemodynamic stability Outcome: Progressing   Problem: Clinical Measurements: Goal: Postoperative complications will be avoided or minimized Outcome: Progressing   Problem: Respiratory: Goal: Respiratory status will improve Outcome: Progressing   Problem: Skin Integrity: Goal: Wound healing without signs and symptoms of infection Outcome: Progressing Goal: Risk for impaired skin integrity will decrease Outcome: Progressing   Problem: Urinary Elimination: Goal: Ability to achieve and maintain adequate renal perfusion and functioning will improve Outcome: Progressing   Problem: Education: Goal: Will demonstrate proper wound care and an understanding of methods to prevent future damage Outcome: Progressing Goal: Knowledge of disease or condition will improve Outcome: Progressing Goal: Knowledge of the prescribed therapeutic regimen will improve Outcome: Progressing Goal: Individualized Educational Video(s) Outcome: Progressing   Problem: Activity: Goal: Risk for activity intolerance will decrease Outcome: Progressing   Problem: Cardiac: Goal: Will achieve and/or maintain hemodynamic stability Outcome: Progressing   Problem: Clinical Measurements: Goal: Postoperative complications will be avoided or minimized Outcome: Progressing   Problem: Respiratory: Goal: Respiratory status will improve Outcome: Progressing   Problem: Skin Integrity: Goal: Wound healing without signs and symptoms of infection Outcome: Progressing Goal: Risk for impaired skin integrity will  decrease Outcome: Progressing   Problem: Urinary Elimination: Goal: Ability to achieve and maintain adequate renal perfusion and functioning will improve Outcome: Progressing

## 2020-12-04 NOTE — Progress Notes (Signed)
  Echocardiogram Echocardiogram Transesophageal has been performed.  Krystal Mcpherson 12/04/2020, 9:30 AM

## 2020-12-04 NOTE — Hospital Course (Addendum)
HPI: Krystal Mcpherson is a very pleasant 80 year old female with past history of coronary artery disease having PTCI with stenting of the right coronary artery proximally in 2012.  She also has a history of hypertension, dyslipidemia, gastroesophageal reflux disease, bradycardia, migraine, and obesity.  About 4 weeks ago she was diagnosed with a deep vein thrombosis of the right popliteal and posterior tibial veins.  She has been on Eliquis daily for about the past 4 weeks.  On 11/26/2020, she began having some left arm pain and numbness.  She contacted her cardiologist's office but was advised to proceed to the emergency room.  She presented to [facility] at about 5 AM today and at that time described having chest pain overnight last night along with left arm discomfort.  She described the pain is intermittent but very similar to the chest pain she had before the right coronary stent placement in 2012.  Initial EKG showed diffuse ST depressions that were considered mild.  She had sinus bradycardia with frequent PVCs.  Initial troponin was 1622 and later rose to 3200.  Having been diagnosed with non-ST elevation myocardial infarction, she was started on heparin and nitroglycerin infusions.  She was transported to [facility] ED this morning for further evaluation and management.     She continued to have 2/6 chest pain through most the morning.  Left heart catheterization was accomplished earlier today and demonstrates severe three-vessel coronary artery disease.  The stent in the proximal right coronary artery is patent with approximately 30% stenosis.  The mid right coronary artery has a 50% stenosis.  The proximal LAD is 90% stenosis in the ostium of the diagonal is 80% stenosed.  There is a 90% stenosis in the first obtuse marginal coronary artery and the mid circumflex is totally occluded.  There are right to left collaterals.  Left ventriculogram was not performed.  An echocardiogram performed  earlier today showed an ejection fraction of 55 to 60% and no significant valvular abnormalities.   Currently, Krystal Mcpherson is resting comfortably and is pain-free on a nitroglycerin infusion.   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  She affirms that these procedures were only done to the lateral aspect of her thighs and lower legs. We have been asked to evaluate Krystal Mcpherson for consideration of coronary bypass grafting for multivessel coronary artery disease.  Dr. Cyndia Bent initially saw the patient in consultation. He discussed the need for coronary artery bypass grafting surgery. Potential risks, benefits, and complications of there surgery were discussed with the patient and she agreed to proceed with surgery. A vascular consult was obtained pre op as well regarding chronic venous insufficiency and a history of Raynaud's.syndrome. Patient had no lower extremity vein conduit secondary to bilateral vein stripping. Her carotid duplex US showed no  evidence of carotid disease bilaterally.  Both vertebral arteries are patent with antegrade flow. Because she needed Plavix washout, surgery was scheduled with Dr. Orvan Seen. He discussed the need to use left radial artery. Patient underwent a CABG x 3 on 12/04/2020.  Hospital Course: She was transported from the OR to ICU in stable condition.  She was extubated the evening of surgery.  She was started on Isosorbide for her left radial artery harvest.  She weaned off Milrinone as  hemodynamics allowed.  She had issues with nausea which responded well to anti-emetics.  Her chest tubes and arterial lines were removed without difficulty. She was maintaining NSR and stable for transfer to progressive care on 12/06/2020.  The patient remained in  Sinus Bradycardia.  Her pacer was turned down with wires removed on 12/07/2020.  The patients weight improved with IV diuretics. She was transitioned to an oral regimen prior to discharge.  She complained of a swollen uvula.  She was treated with salt water gargle with improvement of symptoms.  She was restarted on Eliquis for recently diagnosed DVT.  She was maintaining NSR.  Per discussion with Dr. Orvan Seen, her pacing wires were cut on 06/06. She is ambulating independently.  Her surgical incisions are healing without evidence of infection. She does have some parasthesias along her radial artery harvest site.  She is felt medically stable for discharge home today.

## 2020-12-05 ENCOUNTER — Inpatient Hospital Stay (HOSPITAL_COMMUNITY): Payer: Medicare PPO

## 2020-12-05 ENCOUNTER — Encounter (HOSPITAL_COMMUNITY): Payer: Self-pay | Admitting: Cardiothoracic Surgery

## 2020-12-05 LAB — CBC
HCT: 32 % — ABNORMAL LOW (ref 36.0–46.0)
HCT: 32.2 % — ABNORMAL LOW (ref 36.0–46.0)
Hemoglobin: 10.6 g/dL — ABNORMAL LOW (ref 12.0–15.0)
Hemoglobin: 10.3 g/dL — ABNORMAL LOW (ref 12.0–15.0)
MCH: 28.2 pg (ref 26.0–34.0)
MCH: 27.9 pg (ref 26.0–34.0)
MCHC: 33.1 g/dL (ref 30.0–36.0)
MCHC: 32 g/dL (ref 30.0–36.0)
MCV: 85.1 fL (ref 80.0–100.0)
MCV: 87.3 fL (ref 80.0–100.0)
Platelets: 136 10*3/uL — ABNORMAL LOW (ref 150–400)
Platelets: 145 10*3/uL — ABNORMAL LOW (ref 150–400)
RBC: 3.76 MIL/uL — ABNORMAL LOW (ref 3.87–5.11)
RBC: 3.69 MIL/uL — ABNORMAL LOW (ref 3.87–5.11)
RDW: 13.7 % (ref 11.5–15.5)
RDW: 14.1 % (ref 11.5–15.5)
WBC: 9.3 10*3/uL (ref 4.0–10.5)
WBC: 10.5 10*3/uL (ref 4.0–10.5)
nRBC: 0 % (ref 0.0–0.2)
nRBC: 0 % (ref 0.0–0.2)

## 2020-12-05 LAB — BASIC METABOLIC PANEL
Anion gap: 9 (ref 5–15)
Anion gap: 7 (ref 5–15)
BUN: 16 mg/dL (ref 8–23)
BUN: 18 mg/dL (ref 8–23)
CO2: 24 mmol/L (ref 22–32)
CO2: 24 mmol/L (ref 22–32)
Calcium: 7.5 mg/dL — ABNORMAL LOW (ref 8.9–10.3)
Calcium: 7.6 mg/dL — ABNORMAL LOW (ref 8.9–10.3)
Chloride: 102 mmol/L (ref 98–111)
Chloride: 103 mmol/L (ref 98–111)
Creatinine, Ser: 1.08 mg/dL — ABNORMAL HIGH (ref 0.44–1.00)
Creatinine, Ser: 1.34 mg/dL — ABNORMAL HIGH (ref 0.44–1.00)
GFR, Estimated: 53 mL/min — ABNORMAL LOW (ref >60)
GFR, Estimated: 41 mL/min — ABNORMAL LOW (ref >60)
Glucose, Bld: 120 mg/dL — ABNORMAL HIGH (ref 70–99)
Glucose, Bld: 151 mg/dL — ABNORMAL HIGH (ref 70–99)
Potassium: 3.7 mmol/L (ref 3.5–5.1)
Potassium: 4 mmol/L (ref 3.5–5.1)
Sodium: 135 mmol/L (ref 135–145)
Sodium: 134 mmol/L — ABNORMAL LOW (ref 135–145)

## 2020-12-05 LAB — GLUCOSE, CAPILLARY
Glucose-Capillary: 140 mg/dL — ABNORMAL HIGH (ref 70–99)
Glucose-Capillary: 123 mg/dL — ABNORMAL HIGH (ref 70–99)
Glucose-Capillary: 124 mg/dL — ABNORMAL HIGH (ref 70–99)
Glucose-Capillary: 137 mg/dL — ABNORMAL HIGH (ref 70–99)
Glucose-Capillary: 98 mg/dL (ref 70–99)
Glucose-Capillary: 97 mg/dL (ref 70–99)
Glucose-Capillary: 147 mg/dL — ABNORMAL HIGH (ref 70–99)
Glucose-Capillary: 139 mg/dL — ABNORMAL HIGH (ref 70–99)

## 2020-12-05 LAB — COOXEMETRY PANEL
Carboxyhemoglobin: 1.3 % (ref 0.5–1.5)
Carboxyhemoglobin: 1.1 % (ref 0.5–1.5)
Methemoglobin: 1 % (ref 0.0–1.5)
Methemoglobin: 0.9 % (ref 0.0–1.5)
O2 Saturation: 70.6 %
O2 Saturation: 69.1 %
Total hemoglobin: 11.2 g/dL — ABNORMAL LOW (ref 12.0–16.0)
Total hemoglobin: 10.7 g/dL — ABNORMAL LOW (ref 12.0–16.0)

## 2020-12-05 LAB — MAGNESIUM
Magnesium: 3.3 mg/dL — ABNORMAL HIGH (ref 1.7–2.4)
Magnesium: 2.9 mg/dL — ABNORMAL HIGH (ref 1.7–2.4)

## 2020-12-05 MED ORDER — MILRINONE LACTATE IN DEXTROSE 20-5 MG/100ML-% IV SOLN
0.1250 ug/kg/min | INTRAVENOUS | Status: DC
  Administered 2020-12-05: 0.125 ug/kg/min via INTRAVENOUS
  Filled 2020-12-05: qty 100

## 2020-12-05 MED ORDER — POTASSIUM CHLORIDE 10 MEQ/50ML IV SOLN
10.0000 meq | INTRAVENOUS | Status: AC
  Administered 2020-12-05 (×3): 10 meq via INTRAVENOUS
  Filled 2020-12-05 (×3): qty 50

## 2020-12-05 MED ORDER — INSULIN ASPART 100 UNIT/ML IJ SOLN
0.0000 [IU] | Freq: Three times a day (TID) | INTRAMUSCULAR | Status: DC
  Administered 2020-12-05: 2 [IU] via SUBCUTANEOUS

## 2020-12-05 MED ORDER — KETOROLAC TROMETHAMINE 15 MG/ML IJ SOLN
7.5000 mg | Freq: Four times a day (QID) | INTRAMUSCULAR | Status: DC
  Administered 2020-12-05 – 2020-12-06 (×4): 7.5 mg via INTRAVENOUS
  Filled 2020-12-05 (×4): qty 1

## 2020-12-05 MED ORDER — ISOSORBIDE DINITRATE 10 MG PO TABS
10.0000 mg | ORAL_TABLET | Freq: Three times a day (TID) | ORAL | Status: DC
  Administered 2020-12-05 – 2020-12-11 (×19): 10 mg via ORAL
  Filled 2020-12-05 (×19): qty 1

## 2020-12-05 NOTE — Progress Notes (Signed)
1 Day Post-Op Procedure(s) (LRB): CORONARY ARTERY BYPASS GRAFTING (CABG), ON PUMP, TIMES THREE, USING LEFT INTERNAL MAMMARY ARTERY AND LEFT RADIAL ARTERY (N/A) TRANSESOPHAGEAL ECHOCARDIOGRAM (TEE) (N/A) INDOCYANINE GREEN FLUORESCENCE IMAGING (ICG) (N/A) Left RADIAL ARTERY HARVEST (Left) CLIPPING OF LEFT ATRIAL APPENDAGE Subjective: Mild pain  Objective: Vital signs in last 24 hours: Temp:  [96.08 F (35.6 C)-97.7 F (36.5 C)] 97.52 F (36.4 C) (06/02 0800) Pulse Rate:  [66-89] 67 (06/02 0800) Cardiac Rhythm: Normal sinus rhythm (06/02 0730) Resp:  [11-24] 13 (06/02 0800) BP: (99-134)/(52-77) 120/54 (06/02 0800) SpO2:  [96 %-100 %] 100 % (06/02 0800) Arterial Line BP: (120-164)/(37-73) 150/43 (06/02 0800) FiO2 (%):  [36 %-50 %] 36 % (06/01 1812) Weight:  [97.7 kg] 97.7 kg (06/02 0500)  Hemodynamic parameters for last 24 hours: PAP: (15-36)/(4-20) 28/11 CO:  [3.2 L/min-5 L/min] 4.3 L/min CI:  [1.6 L/min/m2-2.5 L/min/m2] 2.2 L/min/m2  Intake/Output from previous day: 06/01 0701 - 06/02 0700 In: 6370.8 [I.V.:4444.4; Blood:620; IV Piggyback:1306.5] Out: 2915 [Urine:1375; Blood:1200; Chest Tube:340] Intake/Output this shift: No intake/output data recorded.  General appearance: alert and cooperative Neurologic: intact Heart: regular rate and rhythm, S1, S2 normal, no murmur, click, rub or gallop Lungs: clear to auscultation bilaterally Abdomen: soft, non-tender; bowel sounds normal; no masses,  no organomegaly Extremities: edema mild Wound: c/d/i  Lab Results: Recent Labs    12/04/20 1940 12/05/20 0341  WBC 8.0 9.3  HGB 10.7* 10.6*  HCT 33.0* 32.0*  PLT 127* 136*   BMET:  Recent Labs    12/04/20 1940 12/05/20 0341  NA 139 135  K 3.7 3.7  CL 106 102  CO2 23 24  GLUCOSE 137* 120*  BUN 14 16  CREATININE 0.96 1.08*  CALCIUM 7.2* 7.5*    PT/INR:  Recent Labs    12/04/20 1351  LABPROT 16.0*  INR 1.3*   ABG    Component Value Date/Time   PHART 7.324 (L)  12/04/2020 1913   HCO3 22.8 12/04/2020 1913   TCO2 24 12/04/2020 1913   ACIDBASEDEF 3.0 (H) 12/04/2020 1913   O2SAT 70.6 12/05/2020 0354   CBG (last 3)  Recent Labs    12/05/20 0351 12/05/20 0609 12/05/20 0845  GLUCAP 124* 137* 98    Assessment/Plan: S/P Procedure(s) (LRB): CORONARY ARTERY BYPASS GRAFTING (CABG), ON PUMP, TIMES THREE, USING LEFT INTERNAL MAMMARY ARTERY AND LEFT RADIAL ARTERY (N/A) TRANSESOPHAGEAL ECHOCARDIOGRAM (TEE) (N/A) INDOCYANINE GREEN FLUORESCENCE IMAGING (ICG) (N/A) Left RADIAL ARTERY HARVEST (Left) CLIPPING OF LEFT ATRIAL APPENDAGE Mobilize isosorbide for left radial harvest  oob to chair Pain control with iv tylenol given adverse reactions to narcotics   LOS: 6 days    Krystal Mcpherson 12/05/2020

## 2020-12-05 NOTE — Progress Notes (Signed)
Recovering well post CABG day #1. Pain is controlled. On a tiny dose of milrinone. No serious rhythm abnormalities. Will follow along.

## 2020-12-06 ENCOUNTER — Inpatient Hospital Stay (HOSPITAL_COMMUNITY): Payer: Medicare PPO

## 2020-12-06 LAB — BASIC METABOLIC PANEL
Anion gap: 8 (ref 5–15)
BUN: 21 mg/dL (ref 8–23)
CO2: 25 mmol/L (ref 22–32)
Calcium: 7.9 mg/dL — ABNORMAL LOW (ref 8.9–10.3)
Chloride: 100 mmol/L (ref 98–111)
Creatinine, Ser: 1.37 mg/dL — ABNORMAL HIGH (ref 0.44–1.00)
GFR, Estimated: 40 mL/min — ABNORMAL LOW (ref >60)
Glucose, Bld: 109 mg/dL — ABNORMAL HIGH (ref 70–99)
Potassium: 3.9 mmol/L (ref 3.5–5.1)
Sodium: 133 mmol/L — ABNORMAL LOW (ref 135–145)

## 2020-12-06 LAB — GLUCOSE, CAPILLARY
Glucose-Capillary: 109 mg/dL — ABNORMAL HIGH (ref 70–99)
Glucose-Capillary: 116 mg/dL — ABNORMAL HIGH (ref 70–99)
Glucose-Capillary: 129 mg/dL — ABNORMAL HIGH (ref 70–99)
Glucose-Capillary: 135 mg/dL — ABNORMAL HIGH (ref 70–99)

## 2020-12-06 LAB — CBC
HCT: 31.3 % — ABNORMAL LOW (ref 36.0–46.0)
Hemoglobin: 10.1 g/dL — ABNORMAL LOW (ref 12.0–15.0)
MCH: 28.1 pg (ref 26.0–34.0)
MCHC: 32.3 g/dL (ref 30.0–36.0)
MCV: 87.2 fL (ref 80.0–100.0)
Platelets: 144 10*3/uL — ABNORMAL LOW (ref 150–400)
RBC: 3.59 MIL/uL — ABNORMAL LOW (ref 3.87–5.11)
RDW: 14.1 % (ref 11.5–15.5)
WBC: 9.9 10*3/uL (ref 4.0–10.5)
nRBC: 0 % (ref 0.0–0.2)

## 2020-12-06 LAB — COOXEMETRY PANEL
Carboxyhemoglobin: 1.2 % (ref 0.5–1.5)
Methemoglobin: 0.9 % (ref 0.0–1.5)
O2 Saturation: 69.1 %
Total hemoglobin: 11.4 g/dL — ABNORMAL LOW (ref 12.0–16.0)

## 2020-12-06 MED ORDER — APIXABAN 5 MG PO TABS
5.0000 mg | ORAL_TABLET | Freq: Two times a day (BID) | ORAL | Status: DC
  Administered 2020-12-06 – 2020-12-11 (×10): 5 mg via ORAL
  Filled 2020-12-06 (×11): qty 1

## 2020-12-06 MED ORDER — FUROSEMIDE 10 MG/ML IJ SOLN
40.0000 mg | Freq: Two times a day (BID) | INTRAMUSCULAR | Status: DC
  Administered 2020-12-06 – 2020-12-07 (×4): 40 mg via INTRAVENOUS
  Filled 2020-12-06 (×4): qty 4

## 2020-12-06 MED ORDER — POTASSIUM CHLORIDE CRYS ER 20 MEQ PO TBCR
20.0000 meq | EXTENDED_RELEASE_TABLET | Freq: Two times a day (BID) | ORAL | Status: DC
  Administered 2020-12-06 – 2020-12-07 (×4): 20 meq via ORAL
  Filled 2020-12-06 (×4): qty 1

## 2020-12-06 NOTE — Progress Notes (Signed)
Spoke with RN Clydene Fake and she is aware of the DC central line order, plans to remove line today.

## 2020-12-06 NOTE — Progress Notes (Signed)
Pt arrived to unit from 2 Heart. Pt A-paced @ 90, A/O x 4,  CCMD called ,CHG given, pt oriented to unit,Will continue to monitor.  Pt arrived to unit with foley, will contact PA for further assistance to D/C or place new order. Pt states he lives with grandson   Phoebe Sharps, RN    12/06/20 1203  Vitals  Temp 98.4 F (36.9 C)  Temp Source Oral  BP (!) 149/67  MAP (mmHg) 93  BP Location Right Arm  BP Method Automatic  Patient Position (if appropriate) Lying  Pulse Rate 90  Pulse Rate Source Monitor  ECG Heart Rate 90  Resp 20  Level of Consciousness  Level of Consciousness Alert  Oxygen Therapy  SpO2 97 %  O2 Device Room Air  O2 Flow Rate (L/min) 0 L/min  MEWS Score  MEWS Temp 0  MEWS Systolic 0  MEWS Pulse 0  MEWS RR 0  MEWS LOC 0  MEWS Score 0  MEWS Score Color Nyoka Cowden

## 2020-12-06 NOTE — Progress Notes (Signed)
Progress Note  Patient Name: Krystal Mcpherson Date of Encounter: 12/06/2020  [facility] Cardiologist: Candee Furbish, MD   Subjective   Pain controlled. Biggest complaint is nausea. A paced at 90 bpm, underlying NSR 60s. On a nominal dose of milrinone. Excellent PAP. CoOx 69%.  Inpatient Medications    Scheduled Meds: . acetaminophen  1,000 mg Oral Q6H   Or  . acetaminophen (TYLENOL) oral liquid 160 mg/5 mL  1,000 mg Per Tube Q6H  . aspirin EC  325 mg Oral Daily   Or  . aspirin  324 mg Per Tube Daily  . bisacodyl  10 mg Oral Daily   Or  . bisacodyl  10 mg Rectal Daily  . Chlorhexidine Gluconate Cloth  6 each Topical Daily  . docusate sodium  200 mg Oral Daily  . furosemide  40 mg Intravenous BID  . insulin aspart  0-15 Units Subcutaneous TID WC  . isosorbide dinitrate  10 mg Oral TID  . loratadine  10 mg Oral Daily  . mouth rinse  15 mL Mouth Rinse BID  . metoprolol tartrate  12.5 mg Oral BID   Or  . metoprolol tartrate  12.5 mg Per Tube BID  . pantoprazole  40 mg Oral Daily  . potassium chloride  20 mEq Oral BID  . rosuvastatin  5 mg Oral Daily  . sodium chloride flush  10-40 mL Intracatheter Q12H  . topiramate  50 mg Oral Q2200   Continuous Infusions: . sodium chloride Stopped (12/05/20 1246)  . sodium chloride    . sodium chloride 10 mL/hr at 12/04/20 1410  . electrolyte-A Stopped (12/05/20 1122)  . lactated ringers    . lactated ringers    . lactated ringers Stopped (12/05/20 1408)  . phenylephrine (NEO-SYNEPHRINE) Adult infusion 0 mcg/min (12/04/20 1320)   PRN Meds: sodium chloride, lactated ringers, levalbuterol, menthol-cetylpyridinium, metoprolol tartrate, ondansetron (ZOFRAN) IV, oxyCODONE, sodium chloride flush, sodium chloride flush, traMADol   Vital Signs    Vitals:   12/06/20 0333 12/06/20 0400 12/06/20 0500 12/06/20 0600  BP:  133/72 118/64 137/69  Pulse:  89 89 89  Resp:  11 (!) 21 16  Temp: 97.9 F (36.6 C) 97.9 F (36.6 C)    TempSrc:  Oral Oral    SpO2:  97% 98% 95%  Weight:   98.4 kg   Height:        Intake/Output Summary (Last 24 hours) at 12/06/2020 0906 Last data filed at 12/06/2020 0800 Gross per 24 hour  Intake 1310.97 ml  Output 997 ml  Net 313.97 ml   Last 3 Weights 12/06/2020 12/05/2020 12/04/2020  Weight (lbs) 216 lb 14.9 oz 215 lb 6.2 oz 200 lb 6.4 oz  Weight (kg) 98.4 kg 97.7 kg 90.901 kg      Telemetry    A paced rhythm, occ PVCs - Personally Reviewed  ECG    NSR - Personally Reviewed  Physical Exam  Alert, sitting in chair GEN: No acute distress.   Neck: No JVD Cardiac: RRR, no murmurs, rubs, or gallops.  Respiratory: Clear to auscultation bilaterally. GI: Soft, nontender, non-distended  MS: No edema; No deformity. Neuro:  Nonfocal  Psych: Normal affect   Labs    High Sensitivity Troponin:   Recent Labs  Lab 11/28/20 0613 11/28/20 0819  TROPONINIHS 1,622* 3,242*      Chemistry Recent Labs  Lab 12/01/20 0322 12/02/20 0154 12/05/20 0341 12/05/20 1626 12/06/20 0439  NA 136   < > 135 134* 133*  K 3.7   < > 3.7 4.0 3.9  CL 104   < > 102 103 100  CO2 24   < > 24 24 25   GLUCOSE 94   < > 120* 151* 109*  BUN 15   < > 16 18 21   CREATININE 1.09*   < > 1.08* 1.34* 1.37*  CALCIUM 8.3*   < > 7.5* 7.6* 7.9*  PROT 5.2*  --   --   --   --   ALBUMIN 2.7*  --   --   --   --   AST 35  --   --   --   --   ALT 18  --   --   --   --   ALKPHOS 28*  --   --   --   --   BILITOT 0.5  --   --   --   --   GFRNONAA 52*   < > 53* 41* 40*  ANIONGAP 8   < > 9 7 8    < > = values in this interval not displayed.     Hematology Recent Labs  Lab 12/05/20 0341 12/05/20 1626 12/06/20 0439  WBC 9.3 10.5 9.9  RBC 3.76* 3.69* 3.59*  HGB 10.6* 10.3* 10.1*  HCT 32.0* 32.2* 31.3*  MCV 85.1 87.3 87.2  MCH 28.2 27.9 28.1  MCHC 33.1 32.0 32.3  RDW 13.7 14.1 14.1  PLT 136* 145* 144*    BNPNo results for input(s): BNP, PROBNP in the last 168 hours.   DDimer No results for input(s): DDIMER in the last  168 hours.   Radiology    DG Chest Port 1 View  Result Date: 12/06/2020 CLINICAL DATA:  History of open heart surgery EXAM: PORTABLE CHEST 1 VIEW COMPARISON:  12/05/2020 FINDINGS: Swan-Ganz catheter removed.  Right jugular sheath remains in place. Bilateral chest tubes in place. Probable minimal bilateral apical pneumothoraces. Catheter overlying the heart is unchanged. Question mediastinal drain or vascular catheter. Left atrial appendage clip unchanged Left lower lobe atelectasis and small effusion unchanged. Mild right lower lobe atelectasis. IMPRESSION: Small biapical pneumothoraces with bilateral chest tubes in place Bibasilar atelectasis left greater than right. Electronically Signed   By: Franchot Gallo M.D.   On: 12/06/2020 08:15   DG Chest Port 1 View  Result Date: 12/05/2020 CLINICAL DATA:  Pneumothorax. EXAM: PORTABLE CHEST 1 VIEW COMPARISON:  12/04/2020. FINDINGS: Interim extubation and removal of NG tube. Swan-Ganz catheter, mediastinal drainage catheter, and bilateral chest tubes in stable position. A miniscule right apical pneumothorax cannot be completely excluded. Prior CABG. Left atrial appendage clip in stable position. Stable cardiomegaly. Low lung volumes with mild bibasilar atelectasis. Tiny bilateral pleural effusions cannot be excluded. IMPRESSION: 1. Interim extubation and removal of NG tube. Swan-Ganz catheter, mediastinal drainage catheter, bilateral chest tubes in stable position. A miniscule right apical pneumothorax cannot be completely excluded. 2.  Prior CABG.  Stable cardiomegaly. 3. Low lung volumes with mild bibasilar atelectasis. Tiny bilateral pleural effusions cannot be excluded. These results will be called to the ordering clinician or representative by the Radiologist Assistant, and communication documented in the PACS or [employer]. Electronically Signed   By: Marcello Moores  Register   On: 12/05/2020 06:47   DG Chest Port 1 View  Result Date: 12/04/2020 CLINICAL  DATA:  Status post CABG. EXAM: PORTABLE CHEST 1 VIEW COMPARISON:  CT chest from yesterday. FINDINGS: Endotracheal tube in position with the tip at the carina. Enteric tube entering the stomach  with the tip below the field of view. Right internal jugular Swan-Ganz catheter with the tip in the main pulmonary outflow tract. Mediastinal and bilateral chest tubes are in good position. Interval CABG and left atrial appendage clipping. Normal heart size. Low lung volumes with bronchovascular crowding and atelectasis. No pneumothorax or large pleural effusion. No acute osseous abnormality. IMPRESSION: 1. Endotracheal tube tip at the carina. Recommend retraction 2-3 cm. 2. Remaining support lines and tubes in appropriate position. 3. Interval CABG. No pneumothorax. Electronically Signed   By: Titus Dubin M.D.   On: 12/04/2020 14:03    Cardiac Studies  2D echo 11/28/20 1. Left ventricular ejection fraction, by estimation, is 55 to 60%. The  left ventricle has normal function. The left ventricle demonstrates  regional wall motion abnormalities (see scoring diagram/findings for  description). Left ventricular diastolic  parameters are consistent with Grade I diastolic dysfunction (impaired  relaxation).  2. Right ventricular systolic function is normal. The right ventricular  size is normal. There is normal pulmonary artery systolic pressure. The  estimated right ventricular systolic pressure is 03.5 mmHg.  3. The mitral valve is grossly normal. Trivial mitral valve  regurgitation. No evidence of mitral stenosis.  4. The aortic valve is tricuspid. Aortic valve regurgitation is not  visualized. No aortic stenosis is present.  5. The inferior vena cava is normal in size with greater than 50%  respiratory variability, suggesting right atrial pressure of 3 mmHg.   Cath 11/28/20    Prox LAD to Mid LAD lesion is 90% stenosed.  1st Diag lesion is 80% stenosed.  1st Mrg lesion is 90%  stenosed.  Mid Cx lesion is 100% stenosed.  Prox RCA lesion is 30% stenosed.  Prox RCA to Mid RCA lesion is 50% stenosed.  LV end diastolic pressure is mildly elevated.  1. 3 vessel obstructive CAD - 90% proximal to mid LAD, 80% first diagonal - 90% large OM1, 100% LCX post OM1 with right to left collaterals. - Patent stent in the proximal RCA. 50% mid vessel 2. Mildly elevated LVEDP  12/04/2020 Coronary Artery Bypass Grafting x 3 Left Internal Mammary Artery to Distal Left Anterior Descending Coronary Artery; left radial artery graft to  Obtuse Marginal Branch of Left Circumflex Coronary Artery and ramus intermedius coronary artery as a sequenced graft  Patient Profile     80 y.o. female withCAD with prior stenting RCA in 05/2011 with low risk Myoview in 2016, palpitations with brief runs of SVT noted on monitor in 2020, DVT diagnosed on 11/01/2020 on Eliquis, hypertension, hyperlipidemia not on statin, GERD, arthritis, rosacea currently on Doxycycline, migraines,Chiari malformation s/p repair 2001,and Raynaud's syndrome. Presented to [facility] with stuttering CP/indigestion for several days, found to have NSTEMI and MVCAD, underwent CABG 12/04/2020.  Assessment & Plan    Progressing well post op, other than nausea. No meaningful VT or SVT. A paced at 90, but underlying NSR at 65. Presented w NSTEMi, so would advise clopidogrel at DC after all lines/wires removed. Reported myopathy w atorvastatin. Started on low dose rosuvastatin this admission. May need PCSK9 inh if intolerant to statin or unable to reach lipid target.     For questions or updates, please contact [place] Please consult [email or web address] for contact info under        Signed, Sanda Klein, MD  12/06/2020, 9:06 AM

## 2020-12-06 NOTE — Progress Notes (Signed)
Mobility Specialist - Progress Note   12/06/20 1642  Mobility  Activity Ambulated in hall  Level of Assistance Standby assist, set-up cues, supervision of patient - no hands on  Assistive Device Front wheel walker  Distance Ambulated (ft) 450 ft  Mobility Ambulated with assistance in hallway  Mobility Response Tolerated well  Mobility performed by Mobility specialist  $Mobility charge 1 Mobility   Pre-mobility: 85 HR During mobility: 96 HR Post-mobility: 89 HR  Pt c/o nausea throughout ambulation, stating it has been persistent since yesterday. She was otherwise asx. SpO2 remained >95% on RA. Pt to recliner after walk, call bell at side.   Pricilla Handler Mobility Specialist Mobility Specialist Phone: [phone]

## 2020-12-06 NOTE — Plan of Care (Signed)
  Problem: Education: Goal: Knowledge of General Education information will improve Description: Including pain rating scale, medication(s)/side effects and non-pharmacologic comfort measures Outcome: Progressing   Problem: Health Behavior/Discharge Planning: Goal: Ability to manage health-related needs will improve Outcome: Progressing   Problem: Clinical Measurements: Goal: Ability to maintain clinical measurements within normal limits will improve Outcome: Progressing Goal: Will remain free from infection Outcome: Progressing Goal: Diagnostic test results will improve Outcome: Progressing Goal: Respiratory complications will improve Outcome: Progressing Goal: Cardiovascular complication will be avoided Outcome: Progressing   Problem: Coping: Goal: Level of anxiety will decrease Outcome: Progressing   Problem: Pain Managment: Goal: General experience of comfort will improve Outcome: Progressing   Problem: Safety: Goal: Ability to remain free from injury will improve Outcome: Progressing   Problem: Skin Integrity: Goal: Risk for impaired skin integrity will decrease Outcome: Progressing   Problem: Education: Goal: Understanding of CV disease, CV risk reduction, and recovery process will improve Outcome: Progressing Goal: Individualized Educational Video(s) Outcome: Progressing   Problem: Activity: Goal: Ability to return to baseline activity level will improve Outcome: Progressing   Problem: Cardiovascular: Goal: Ability to achieve and maintain adequate cardiovascular perfusion will improve Outcome: Progressing Goal: Vascular access site(s) Level 0-1 will be maintained Outcome: Progressing   Problem: Health Behavior/Discharge Planning: Goal: Ability to safely manage health-related needs after discharge will improve Outcome: Progressing   Problem: Education: Goal: Will demonstrate proper wound care and an understanding of methods to prevent future  damage Outcome: Progressing Goal: Knowledge of disease or condition will improve Outcome: Progressing Goal: Knowledge of the prescribed therapeutic regimen will improve Outcome: Progressing Goal: Individualized Educational Video(s) Outcome: Progressing   Problem: Activity: Goal: Risk for activity intolerance will decrease Outcome: Progressing   Problem: Cardiac: Goal: Will achieve and/or maintain hemodynamic stability Outcome: Progressing   Problem: Clinical Measurements: Goal: Postoperative complications will be avoided or minimized Outcome: Progressing   Problem: Respiratory: Goal: Respiratory status will improve Outcome: Progressing   Problem: Skin Integrity: Goal: Wound healing without signs and symptoms of infection Outcome: Progressing Goal: Risk for impaired skin integrity will decrease Outcome: Progressing   Problem: Urinary Elimination: Goal: Ability to achieve and maintain adequate renal perfusion and functioning will improve Outcome: Progressing   Problem: Education: Goal: Will demonstrate proper wound care and an understanding of methods to prevent future damage Outcome: Progressing Goal: Knowledge of disease or condition will improve Outcome: Progressing Goal: Knowledge of the prescribed therapeutic regimen will improve Outcome: Progressing Goal: Individualized Educational Video(s) Outcome: Progressing   Problem: Activity: Goal: Risk for activity intolerance will decrease Outcome: Progressing   Problem: Cardiac: Goal: Will achieve and/or maintain hemodynamic stability Outcome: Progressing   Problem: Clinical Measurements: Goal: Postoperative complications will be avoided or minimized Outcome: Progressing   Problem: Respiratory: Goal: Respiratory status will improve Outcome: Progressing   Problem: Skin Integrity: Goal: Wound healing without signs and symptoms of infection Outcome: Progressing Goal: Risk for impaired skin integrity will  decrease Outcome: Progressing   Problem: Urinary Elimination: Goal: Ability to achieve and maintain adequate renal perfusion and functioning will improve Outcome: Progressing

## 2020-12-06 NOTE — Progress Notes (Signed)
2 Days Post-Op Procedure(s) (LRB): CORONARY ARTERY BYPASS GRAFTING (CABG), ON PUMP, TIMES THREE, USING LEFT INTERNAL MAMMARY ARTERY AND LEFT RADIAL ARTERY (N/A) TRANSESOPHAGEAL ECHOCARDIOGRAM (TEE) (N/A) INDOCYANINE GREEN FLUORESCENCE IMAGING (ICG) (N/A) Left RADIAL ARTERY HARVEST (Left) CLIPPING OF LEFT ATRIAL APPENDAGE Subjective: Feeling better but has been nauseated  Objective: Vital signs in last 24 hours: Temp:  [97 F (36.1 C)-98.1 F (36.7 C)] 97.9 F (36.6 C) (06/03 0400) Pulse Rate:  [62-90] 89 (06/03 0600) Cardiac Rhythm: Atrial paced (06/03 0400) Resp:  [11-21] 16 (06/03 0600) BP: (104-137)/(54-78) 137/69 (06/03 0600) SpO2:  [94 %-100 %] 95 % (06/03 0600) Arterial Line BP: (157)/(49) 157/49 (06/02 0900) Weight:  [98.4 kg] 98.4 kg (06/03 0500)  Hemodynamic parameters for last 24 hours: PAP: (31)/(15) 31/15  Intake/Output from previous day: 06/02 0701 - 06/03 0700 In: 1203.6 [P.O.:320; I.V.:586.5; IV Piggyback:297.1] Out: 895 [Urine:605; Chest Tube:290] Intake/Output this shift: Total I/O In: 107.3 [I.V.:7.3; IV Piggyback:100] Out: 102 [Urine:42; Chest Tube:60]  General appearance: alert Neurologic: intact Heart: regular rate and rhythm, S1, S2 normal, no murmur, click, rub or gallop Lungs: clear to auscultation bilaterally Abdomen: soft, non-tender; bowel sounds normal; no masses,  no organomegaly Extremities: edema 1+ Wound: c/d/i  Lab Results: Recent Labs    12/05/20 1626 12/06/20 0439  WBC 10.5 9.9  HGB 10.3* 10.1*  HCT 32.2* 31.3*  PLT 145* 144*   BMET:  Recent Labs    12/05/20 1626 12/06/20 0439  NA 134* 133*  K 4.0 3.9  CL 103 100  CO2 24 25  GLUCOSE 151* 109*  BUN 18 21  CREATININE 1.34* 1.37*  CALCIUM 7.6* 7.9*    PT/INR:  Recent Labs    12/04/20 1351  LABPROT 16.0*  INR 1.3*   ABG    Component Value Date/Time   PHART 7.324 (L) 12/04/2020 1913   HCO3 22.8 12/04/2020 1913   TCO2 24 12/04/2020 1913   ACIDBASEDEF 3.0 (H)  12/04/2020 1913   O2SAT 69.1 12/06/2020 0441   CBG (last 3)  Recent Labs    12/05/20 2333 12/06/20 0331 12/06/20 0757  GLUCAP 135* 129* 116*    Assessment/Plan: S/P Procedure(s) (LRB): CORONARY ARTERY BYPASS GRAFTING (CABG), ON PUMP, TIMES THREE, USING LEFT INTERNAL MAMMARY ARTERY AND LEFT RADIAL ARTERY (N/A) TRANSESOPHAGEAL ECHOCARDIOGRAM (TEE) (N/A) INDOCYANINE GREEN FLUORESCENCE IMAGING (ICG) (N/A) Left RADIAL ARTERY HARVEST (Left) CLIPPING OF LEFT ATRIAL APPENDAGE Mobilize Plan for transfer to step-down: see transfer orders Continue atrially pacing at 90; txfer to 4E  LOS: 7 days    Krystal Mcpherson 12/06/2020

## 2020-12-06 NOTE — Progress Notes (Signed)
CARDIAC REHAB PHASE I   PRE:  Rate/Rhythm: 90 a pacing    BP: sitting 149/67    SaO2: 98 RA  MODE:  Ambulation: 430 ft   POST:  Rate/Rhythm: 102 a pacing, brief afib 111 after walk    BP: sitting 151/72     SaO2: 100 RA  Pt uncomfortable in bed and eager to walk. Moved to EOB and stood independently. To BR then ambulated with RW. Slow and steady. No major c/o except fatigue with distance. Pacing 102 during walk however upon sitting did have a brief run of what appeared to bed afib < 2 sec. Not symptomatic. Discussed IS, walking, recliner. Pt receptive. [place], ACSM 12/06/2020 1:44 PM

## 2020-12-07 ENCOUNTER — Inpatient Hospital Stay (HOSPITAL_COMMUNITY): Payer: Medicare PPO

## 2020-12-07 LAB — TYPE AND SCREEN
ABO/RH(D): AB POS
Antibody Screen: NEGATIVE
Unit division: 0
Unit division: 0
Unit division: 0
Unit division: 0
Unit division: 0
Unit division: 0

## 2020-12-07 LAB — BPAM RBC
Blood Product Expiration Date: 202206162359
Blood Product Expiration Date: 202206202359
Blood Product Expiration Date: 202206222359
Blood Product Expiration Date: 202206242359
Blood Product Expiration Date: 202206242359
Blood Product Expiration Date: 202206242359
ISSUE DATE / TIME: 202206011026
ISSUE DATE / TIME: 202206011026
ISSUE DATE / TIME: 202206011117
ISSUE DATE / TIME: 202206011117
Unit Type and Rh: 6200
Unit Type and Rh: 6200
Unit Type and Rh: 6200
Unit Type and Rh: 6200
Unit Type and Rh: 8400
Unit Type and Rh: 8400

## 2020-12-07 LAB — BASIC METABOLIC PANEL
Anion gap: 7 (ref 5–15)
BUN: 27 mg/dL — ABNORMAL HIGH (ref 8–23)
CO2: 25 mmol/L (ref 22–32)
Calcium: 8 mg/dL — ABNORMAL LOW (ref 8.9–10.3)
Chloride: 102 mmol/L (ref 98–111)
Creatinine, Ser: 1.37 mg/dL — ABNORMAL HIGH (ref 0.44–1.00)
GFR, Estimated: 40 mL/min — ABNORMAL LOW (ref >60)
Glucose, Bld: 99 mg/dL (ref 70–99)
Potassium: 4.4 mmol/L (ref 3.5–5.1)
Sodium: 134 mmol/L — ABNORMAL LOW (ref 135–145)

## 2020-12-07 LAB — CBC
HCT: 32.8 % — ABNORMAL LOW (ref 36.0–46.0)
Hemoglobin: 10.9 g/dL — ABNORMAL LOW (ref 12.0–15.0)
MCH: 28.3 pg (ref 26.0–34.0)
MCHC: 33.2 g/dL (ref 30.0–36.0)
MCV: 85.2 fL (ref 80.0–100.0)
Platelets: 170 10*3/uL (ref 150–400)
RBC: 3.85 MIL/uL — ABNORMAL LOW (ref 3.87–5.11)
RDW: 14.3 % (ref 11.5–15.5)
WBC: 9.6 10*3/uL (ref 4.0–10.5)
nRBC: 0 % (ref 0.0–0.2)

## 2020-12-07 NOTE — Progress Notes (Signed)
Mobility Specialist - Progress Note   12/07/20 1425  Mobility  Activity Ambulated in hall  Level of Assistance Standby assist, set-up cues, supervision of patient - no hands on  Assistive Device Front wheel walker  Distance Ambulated (ft) 420 ft  Mobility Ambulated with assistance in hallway  Mobility Response Tolerated well  Mobility performed by Mobility specialist  $Mobility charge 1 Mobility   Pt asx throughout ambulation. Pt to recliner after walk, call bell at side. VSS throughout.  Pricilla Handler Mobility Specialist Mobility Specialist Phone: [phone]

## 2020-12-07 NOTE — Progress Notes (Signed)
CARDIAC REHAB PHASE I   PRE:  Rate/Rhythm: 65/SR   BP:  Sitting: 116/60       SaO2: 97%  MODE:  Ambulation: 470 ft   POST:  Rate/Rhythm: 76/SR  BP:  Sitting: 112/61      SaO2: 97%  Pt received in chair. Pt agrees to ambulate. Pt ambulated with steady gait using the front well walker. Pt had no C/O SOB, dizziness, or pain with ambulation. Pt back to chair. External pacemaker in place with rate at 60. Call bell and tray table in reach. I/S = 1000 ml. I/S encouraged 10 x/hr. OOB to chair encouraged.    Lesly Rubenstein, MS, ACSM EP-C, [facility] 12/07/2020   11:45 - 12:23

## 2020-12-07 NOTE — Progress Notes (Signed)
Patient ambulated in hallway with walker and nursing staff. 450 feet. Will monitor patient. Desire Fulp, Bettina Gavia RN

## 2020-12-07 NOTE — Progress Notes (Addendum)
      [address]       [place] [address]             [phone]      3 Days Post-Op Procedure(s) (LRB): CORONARY ARTERY BYPASS GRAFTING (CABG), ON PUMP, TIMES THREE, USING LEFT INTERNAL MAMMARY ARTERY AND LEFT RADIAL ARTERY (N/A) TRANSESOPHAGEAL ECHOCARDIOGRAM (TEE) (N/A) INDOCYANINE GREEN FLUORESCENCE IMAGING (ICG) (N/A) Left RADIAL ARTERY HARVEST (Left) CLIPPING OF LEFT ATRIAL APPENDAGE   Subjective:  States nausea has improved after she was able to move her bowels.  She does complain of swelling in her uvula which she figures was from being on respirator.  + ambulation  Objective: Vital signs in last 24 hours: Temp:  [97.9 F (36.6 C)-98.5 F (36.9 C)] 98.2 F (36.8 C) (06/04 0423) Pulse Rate:  [89-90] 89 (06/04 0423) Cardiac Rhythm: Atrial paced (06/03 1959) Resp:  [12-20] 12 (06/04 0423) BP: (115-149)/(63-98) 120/70 (06/04 0423) SpO2:  [95 %-97 %] 96 % (06/04 0423) Weight:  [95.9 kg] 95.9 kg (06/04 0423)  Intake/Output from previous day: 06/03 0701 - 06/04 0700 In: 409.9 [P.O.:300; I.V.:9.9; IV Piggyback:100] Out: 2683 [MHDQQ:2297; Chest Tube:130]  General appearance: alert, cooperative and no distress Heart: regular rate and rhythm Lungs: clear to auscultation bilaterally Abdomen: soft, non-tender; bowel sounds normal; no masses,  no organomegaly Extremities: edema trace Wound: clean and dry  Lab Results: Recent Labs    12/06/20 0439 12/07/20 0459  WBC 9.9 9.6  HGB 10.1* 10.9*  HCT 31.3* 32.8*  PLT 144* 170   BMET:  Recent Labs    12/06/20 0439 12/07/20 0222  NA 133* 134*  K 3.9 4.4  CL 100 102  CO2 25 25  GLUCOSE 109* 99  BUN 21 27*  CREATININE 1.37* 1.37*  CALCIUM 7.9* 8.0*    PT/INR:  Recent Labs    12/04/20 1351  LABPROT 16.0*  INR 1.3*   ABG    Component Value Date/Time   PHART 7.324 (L) 12/04/2020 1913   HCO3 22.8 12/04/2020 1913   TCO2 24 12/04/2020 1913   ACIDBASEDEF 3.0 (H) 12/04/2020 1913   O2SAT  69.1 12/06/2020 0441   CBG (last 3)  Recent Labs    12/06/20 0331 12/06/20 0757 12/06/20 1112  GLUCAP 129* 116* 109*    Assessment/Plan: S/P Procedure(s) (LRB): CORONARY ARTERY BYPASS GRAFTING (CABG), ON PUMP, TIMES THREE, USING LEFT INTERNAL MAMMARY ARTERY AND LEFT RADIAL ARTERY (N/A) TRANSESOPHAGEAL ECHOCARDIOGRAM (TEE) (N/A) INDOCYANINE GREEN FLUORESCENCE IMAGING (ICG) (N/A) Left RADIAL ARTERY HARVEST (Left) CLIPPING OF LEFT ATRIAL APPENDAGE  1. CV- NSR, paced at 90.. will turn pacer back down to 60- on Isordil for radial artery, Lopressor, Eliquis for LA Clip 2. Pulm- no acute issues, continue IS 3. Renal- creatinine stable at 1.37, weight is trending down, continue Lasix IV today, will transition to oral regimen today 4. Expected post operative blood loss anemia, Hgb stable at 10.9 5. Uvula swelling- will order warm salt water gargles 6. Dispo- patient stable, maintaining Sinus Bradycardia under pacer, will place on back up of 60.. if remains stable, will d/c EPW in AM, continue diuresis, salt water gargles for uvula swelling, continue current care   LOS: 8 days    Ellwood Handler, PA-C 12/07/2020   Agree with above Will keep pacer to back-up rate Will keep wires until Monday since pt is already on [place]

## 2020-12-08 LAB — BASIC METABOLIC PANEL
Anion gap: 7 (ref 5–15)
BUN: 23 mg/dL (ref 8–23)
CO2: 26 mmol/L (ref 22–32)
Calcium: 7.9 mg/dL — ABNORMAL LOW (ref 8.9–10.3)
Chloride: 103 mmol/L (ref 98–111)
Creatinine, Ser: 1.33 mg/dL — ABNORMAL HIGH (ref 0.44–1.00)
GFR, Estimated: 41 mL/min — ABNORMAL LOW (ref >60)
Glucose, Bld: 98 mg/dL (ref 70–99)
Potassium: 4 mmol/L (ref 3.5–5.1)
Sodium: 136 mmol/L (ref 135–145)

## 2020-12-08 MED ORDER — FUROSEMIDE 40 MG PO TABS
40.0000 mg | ORAL_TABLET | Freq: Every day | ORAL | Status: DC
  Administered 2020-12-08 – 2020-12-10 (×3): 40 mg via ORAL
  Filled 2020-12-08 (×3): qty 1

## 2020-12-08 MED ORDER — POTASSIUM CHLORIDE CRYS ER 20 MEQ PO TBCR
20.0000 meq | EXTENDED_RELEASE_TABLET | Freq: Every day | ORAL | Status: DC
  Administered 2020-12-08 – 2020-12-10 (×3): 20 meq via ORAL
  Filled 2020-12-08 (×3): qty 1

## 2020-12-08 NOTE — Plan of Care (Signed)
  Problem: Health Behavior/Discharge Planning: Goal: Ability to manage health-related needs will improve Outcome: Progressing   Problem: Clinical Measurements: Goal: Will remain free from infection Outcome: Progressing Goal: Diagnostic test results will improve Outcome: Progressing   

## 2020-12-08 NOTE — Progress Notes (Signed)
Patient ambulated in hallway with nursing staff and rolling walker. 470 feet. Tolerated well back in room to chair. Call bell with in reach. Bethene Hankinson, Bettina Gavia RN

## 2020-12-08 NOTE — Progress Notes (Signed)
Mobility Specialist - Progress Note   12/08/20 1445  Mobility  Activity Ambulated in hall  Level of Assistance Standby assist, set-up cues, supervision of patient - no hands on  Assistive Device Front wheel walker  Distance Ambulated (ft) 470 ft  Mobility Ambulated with assistance in hallway  Mobility Response Tolerated well  Mobility performed by Mobility specialist  $Mobility charge 1 Mobility   Pt asx throughout ambulation. Pt back in bed after walk, call bell at side. VSS throughout.  Pricilla Handler Mobility Specialist Mobility Specialist Phone: [phone]

## 2020-12-08 NOTE — Discharge Summary (Addendum)
[address]       [place] [address]             [phone]    Physician Discharge Summary  Patient ID: Krystal Mcpherson MRN: [id number] DOB/AGE: 1940/09/12 80 y.o.  Admit date: 11/28/2020 Discharge date: 12/11/2020  Admission Diagnoses:  Patient Active Problem List   Diagnosis Date Noted  . Coronary artery disease 12/04/2020  . NSTEMI (non-ST elevated myocardial infarction) ([place]) 11/28/2020  . Cellulitis 08/09/2016  . Dog bite, hand, unspecified laterality, subsequent encounter 08/09/2016  . Change in bowel habits   . Obesity 03/19/2015  . Angina decubitus ([place]) 08/01/2014  . Atherosclerosis of native coronary artery of native heart without angina pectoris 03/02/2014  . Essential hypertension, benign 03/02/2014  . Bradycardia 03/02/2014   Discharge Diagnoses:  Patient Active Problem List   Diagnosis Date Noted  . S/P CABG x 3 12/04/2020  . Coronary artery disease 12/04/2020  . NSTEMI (non-ST elevated myocardial infarction) ([place]) 11/28/2020  . Cellulitis 08/09/2016  . Dog bite, hand, unspecified laterality, subsequent encounter 08/09/2016  . Change in bowel habits   . Obesity 03/19/2015  . Angina decubitus ([place]) 08/01/2014  . Atherosclerosis of native coronary artery of native heart without angina pectoris 03/02/2014  . Essential hypertension, benign 03/02/2014  . Bradycardia 03/02/2014  Post op SVT, PACs, PVCs, atrial bigeminy, a fib  Discharged Condition: good  HPI: Krystal Mcpherson is a very pleasant 80 year old female with past history of coronary artery disease having PTCI with stenting of the right coronary artery proximally in 2012.  She also has a history of hypertension, dyslipidemia, gastroesophageal reflux disease, bradycardia, migraine, and obesity.  About 4 weeks ago she was diagnosed with a deep vein thrombosis of the right popliteal and posterior tibial veins.  She has been on Eliquis daily for about the past 4 weeks.  On 11/26/2020, she began  having some left arm pain and numbness.  She contacted her cardiologist's office but was advised to proceed to the emergency room.  She presented to [facility] at about 5 AM today and at that time described having chest pain overnight last night along with left arm discomfort.  She described the pain is intermittent but very similar to the chest pain she had before the right coronary stent placement in 2012.  Initial EKG showed diffuse ST depressions that were considered mild.  She had sinus bradycardia with frequent PVCs.  Initial troponin was 1622 and later rose to 3200.  Having been diagnosed with non-ST elevation myocardial infarction, she was started on heparin and nitroglycerin infusions.  She was transported to [facility] ED this morning for further evaluation and management.     She continued to have 2/6 chest pain through most the morning.  Left heart catheterization was accomplished earlier today and demonstrates severe three-vessel coronary artery disease.  The stent in the proximal right coronary artery is patent with approximately 30% stenosis.  The mid right coronary artery has a 50% stenosis.  The proximal LAD is 90% stenosis in the ostium of the diagonal is 80% stenosed.  There is a 90% stenosis in the first obtuse marginal coronary artery and the mid circumflex is totally occluded.  There are right to left collaterals.  Left ventriculogram was not performed.  An echocardiogram performed earlier today showed an ejection fraction of 55 to 60% and no significant valvular abnormalities.   Currently, Krystal Mcpherson is resting comfortably and is pain-free on  a nitroglycerin infusion.   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  She affirms that these  procedures were only done to the lateral aspect of her thighs and lower legs. We have been asked to evaluate Krystal Mcpherson for consideration of coronary bypass grafting for multivessel coronary artery disease.  Dr. Cyndia Bent initially saw the patient in consultation. He discussed the need for coronary artery bypass grafting surgery. Potential risks, benefits, and complications of there surgery were discussed with the patient and she agreed to proceed with surgery. A vascular consult was obtained pre op as well regarding chronic venous insufficiency and a history of Raynaud's.syndrome. Patient had no lower extremity vein conduit secondary to bilateral vein stripping. Her carotid duplex US showed no  evidence of carotid disease bilaterally.  Both vertebral arteries are patent with antegrade flow. Because she needed Plavix washout, surgery was scheduled with Dr. Orvan Seen. He discussed the need to use left radial artery. Patient underwent a CABG x 3 on 12/04/2020.  Hospital Course: She was transported from the OR to ICU in stable condition.  She was extubated the evening of surgery.  She was started on Isosorbide for her left radial artery harvest.  She weaned off Milrinone as hemodynamics allowed.  She had issues with nausea which responded well to anti-emetics.  Her chest tubes and arterial lines were removed without difficulty. She was maintaining NSR and stable for transfer to progressive care on 12/06/2020.  The patient remained in Sinus Bradycardia.  Her pacer was turned down with wires removed on 12/07/2020.  The patients weight improved with IV diuretics. She was transitioned to an oral regimen prior to discharge.  She complained of a swollen uvula.  She was treated with salt water gargle with improvement of symptoms.  She was restarted on Eliquis for recently diagnosed DVT.  She was maintaining NSR.  Her pacing wires were cut (as discussed with Dr. Orvan Seen because she was already on Apixaban for her DVT history).  She  did have mild kidney insufficiency post op. Her last creatinine on 06/06 was down to 1.22. She had nausea intermittingly post op. She was given Zofran PRN and scheduled Reglan and it did resolve. She has been tolerating a diet and has had bowel movements. She is ambulating independently.  Her surgical incisions are healing without evidence of infection.   We will defer to cardiology about titration of Crestor (on low dose) as an outpatient. Chest tube sutures will be removed prior to discharge. Patient had a fib when was walking. Her HR got up into the low 100's. As a result, Lopressor was increased to 25 mg bid. I spoke with Dr. Marlou Porch (her cardiologist) and he recommended Amiodarone 400 mg bid for 06/07. She maintained sinus rhythm and HR in the 60-70's on 06/08. As a result, will decrease Amiodarone to daily.Marland Kitchen She will not be on Amiodarone for very long. Patient and I did discuss if heart rate or BP low (she has devices to check both) and she has any complaints, to call cardiology or our office as we may have to titrate current medications. Dr. Marlou Porch will determine after discharge, if she needs to change back to Atenolol. Of note, patient had LA clip placed at the time of surgery. As per discussion with Dr. Orvan Seen, she is  felt medically stable for discharge home today.     Significant Diagnostic Studies: angiography:    Prox LAD to Mid LAD lesion is 90% stenosed.  1st Diag lesion is 80% stenosed.  1st Mrg lesion is 90% stenosed.  Mid Cx lesion is 100% stenosed.  Prox RCA lesion is 30% stenosed.  Prox RCA to Mid RCA lesion is 50% stenosed.  LV end diastolic pressure is mildly elevated.   1. 3 vessel obstructive CAD    - 90% proximal to mid LAD, 80% first diagonal    - 90% large OM1, 100% LCX post OM1 with right to left collaterals.    - Patent stent in the proximal RCA. 50% mid vessel 2. Mildly elevated LVEDP  Treatments: surgery:   CARDIOTHORACIC SURGERY OPERATIVE NOTE  Date of  Procedure:    12/04/2020  Preoperative Diagnosis:      Severe 3-vessel Coronary Artery Disease, status post NSTEMI  Postoperative Diagnosis:    Same  Procedure:        Coronary Artery Bypass Grafting x 3             Left Internal Mammary Artery to Distal Left Anterior Descending Coronary Artery; left radial artery graft to  Obtuse Marginal Branch of Left Circumflex Coronary Artery and ramus intermedius coronary artery as a sequenced graft Open left radial artery harvesting Completion graft surveillance with indocyanine green fluorescence angiography  Surgeon:        B.  Murvin Natal, MD  Assistant:       Josie Saunders, PA-C    Discharge Exam: Blood pressure (!) 150/63, pulse 69, temperature 98.6 F (37 C), temperature source Oral, resp. rate 13, height 5\' 6"  (1.676 m), weight 92.2 kg, SpO2 98 %.  Cardiovascular: RRR Pulmonary: Slightly diminished bibasilar breath sounds  Abdomen: Soft, non tender, bowel sounds present. Extremities: Mild bilateral lower extremity edema. Motor/sensory intact LUE. No numbness left hand Wounds: Sternal wound is clean and dry and there is slight erythema along skin edges (probable suture reaction) but no sign of infection. LUE wound is clean and dry  Discharge Medications:  The patient has been discharged on:   1.Beta Blocker:  Yes [ X  ]                              No   [   ]                              If No, reason:  2.Ace Inhibitor/ARB: Yes [   ]                                     No  [  X  ]                                     If No, reason: labile BP. Will consider starting as outpatient  3.Statin:   Yes [ X  ]                  No  [   ]                  If No, reason:  4.Ecasa:  Yes  [  X  ]                  No   [   ]                  If No, reason:    Discharge Instructions    Amb Referral to Cardiac Rehabilitation   Complete by: As directed    Diagnosis:  CABG NSTEMI     CABG X ___: 3   After initial evaluation and  assessments completed: Virtual Based Care may be provided alone or in conjunction with Phase 2 Cardiac Rehab based on patient barriers.: Yes     Allergies as of 12/11/2020      Reactions   Metronidazole Hives   Atorvastatin    Muscle pain   Ciprofloxacin Hives   Fentanyl Hives   Itching all over   Morphine And Related    Hallucinations   Tetanus Toxoids    Localized knot   Augmentin [amoxicillin-pot Clavulanate] Nausea And Vomiting   Pt has taken Keflex (cephalexin) without adverse reaction. Started 08/06/16      Medication List    STOP taking these medications   atenolol 25 MG tablet Commonly known as: TENORMIN   hydrochlorothiazide 25 MG tablet Commonly known as: HYDRODIURIL   nitroGLYCERIN 0.4 MG SL tablet Commonly known as: NITROSTAT   olmesartan 40 MG tablet Commonly known as: BENICAR     TAKE these medications   acetaminophen 500 MG tablet Commonly known as: TYLENOL Take 1,000 mg by mouth every 6 (six) hours as needed for mild pain (pain).   albuterol 108 (90 Base) MCG/ACT inhaler Commonly known as: VENTOLIN HFA Inhale 2 puffs into the lungs every 4 (four) hours as needed for wheezing or shortness of breath. Reported on 10/30/2015   amiodarone 200 MG tablet Commonly known as: PACERONE Take 1 tablet (200 mg total) by mouth daily.   aspirin 81 MG EC tablet Take 1 tablet (81 mg total) by mouth daily. Swallow whole.   cetirizine 10 MG tablet Commonly known as: ZYRTEC Take 10 mg by mouth daily.   diphenhydrAMINE 25 mg capsule Commonly known as: BENADRYL Take 25-50 mg by mouth every 6 (six) hours as needed for itching. Reported on 10/30/2015   doxycycline 50 MG capsule Commonly known as: VIBRAMYCIN Take 50 mg by mouth daily.   Eliquis 5 MG Tabs tablet Generic drug: apixaban Take 5 mg by mouth 2 (two) times daily.   fluticasone 50 MCG/ACT nasal spray Commonly known as: FLONASE Place 2 sprays into both nostrils as needed for allergies or rhinitis.    furosemide 40 MG tablet Commonly known as: LASIX Take 1 tablet (40 mg total) by mouth daily.   isosorbide dinitrate 10 MG tablet Commonly known as: ISORDIL Take 1 tablet (10 mg total) by mouth 3 (three) times daily.   metoprolol tartrate 25 MG tablet Commonly known as: LOPRESSOR Take 1 tablet (25 mg total) by mouth 2 (two) times daily.   potassium chloride SA 20 MEQ tablet Commonly known as: KLOR-CON Take 1 tablet (20 mEq total) by mouth daily.   rosuvastatin 5 MG tablet Commonly known as: CRESTOR Take 1 tablet (5 mg total) by mouth daily.   topiramate 50 MG tablet Commonly known as: TOPAMAX Take 50 mg by mouth daily.   traMADol 50 MG tablet Commonly known as: ULTRAM Take 1 tablet (50 mg total) by mouth every 6 (six) hours as needed for severe pain (pain). Reported on 11/13/2015 What changed:  reasons to take this   Ubrelvy 50 MG Tabs Generic drug: Ubrogepant Take 1 tablet by mouth daily as needed (For migraine).       Follow-up Information    Loel Dubonnet, NP. Go on 12/27/2020.   Specialty: Cardiology Why: Appointment time is at 2:15 pm  Contact information: [place] [address] [phone]        Wonda Olds, MD. Go on 12/19/2020.   Specialty: Cardiothoracic Surgery Why: Appointment time is at 1:30 pm Contact information: [address] [place] [address] [place], [place], FNP. Call.   Specialty: Family Medicine Why: for a follow up appointment for one month regarding further surveillance of HGA1C 5.8 (pre diabetes) Contact information: [place] [address] [phone]               Signed: Lars Pinks PA-C 12/11/2020, 7:59 AM

## 2020-12-08 NOTE — Progress Notes (Addendum)
      [address]       [place] [address]             [phone]      4 Days Post-Op Procedure(s) (LRB): CORONARY ARTERY BYPASS GRAFTING (CABG), ON PUMP, TIMES THREE, USING LEFT INTERNAL MAMMARY ARTERY AND LEFT RADIAL ARTERY (N/A) TRANSESOPHAGEAL ECHOCARDIOGRAM (TEE) (N/A) INDOCYANINE GREEN FLUORESCENCE IMAGING (ICG) (N/A) Left RADIAL ARTERY HARVEST (Left) CLIPPING OF LEFT ATRIAL APPENDAGE   Subjective:  Patient overall is feeling pretty good.  She slept wrong on her neck and has a heating pad on it.  She does have some numbness along her radial artery site.  She is hoping to go home tomorrow.  + ambulation  + BM  Objective: Vital signs in last 24 hours: Temp:  [97.9 F (36.6 C)-98.2 F (36.8 C)] 97.9 F (36.6 C) (06/05 0824) Pulse Rate:  [61-74] 74 (06/05 0824) Cardiac Rhythm: Normal sinus rhythm (06/05 0700) Resp:  [14-17] 17 (06/05 0824) BP: (114-128)/(52-83) 121/59 (06/05 0824) SpO2:  [96 %-98 %] 97 % (06/05 0824) Weight:  [94.4 kg] 94.4 kg (06/05 0406)  Intake/Output from previous day: 06/04 0701 - 06/05 0700 In: 237 [P.O.:237] Out: -  Intake/Output this shift: Total I/O In: 230 [P.O.:230] Out: -   General appearance: alert, cooperative and no distress Heart: regular rate and rhythm Lungs: clear to auscultation bilaterally Abdomen: soft, non-tender; bowel sounds normal; no masses,  no organomegaly Extremities: edema trace Wound: clean and dry  Lab Results: Recent Labs    12/06/20 0439 12/07/20 0459  WBC 9.9 9.6  HGB 10.1* 10.9*  HCT 31.3* 32.8*  PLT 144* 170   BMET:  Recent Labs    12/07/20 0222 12/08/20 0556  NA 134* 136  K 4.4 4.0  CL 102 103  CO2 25 26  GLUCOSE 99 98  BUN 27* 23  CREATININE 1.37* 1.33*  CALCIUM 8.0* 7.9*    PT/INR: No results for input(s): LABPROT, INR in the last 72 hours. ABG    Component Value Date/Time   PHART 7.324 (L) 12/04/2020 1913   HCO3 22.8 12/04/2020 1913   TCO2 24 12/04/2020 1913    ACIDBASEDEF 3.0 (H) 12/04/2020 1913   O2SAT 69.1 12/06/2020 0441   CBG (last 3)  Recent Labs    12/06/20 0331 12/06/20 0757 12/06/20 1112  GLUCAP 129* 116* 109*    Assessment/Plan: S/P Procedure(s) (LRB): CORONARY ARTERY BYPASS GRAFTING (CABG), ON PUMP, TIMES THREE, USING LEFT INTERNAL MAMMARY ARTERY AND LEFT RADIAL ARTERY (N/A) TRANSESOPHAGEAL ECHOCARDIOGRAM (TEE) (N/A) INDOCYANINE GREEN FLUORESCENCE IMAGING (ICG) (N/A) Left RADIAL ARTERY HARVEST (Left) CLIPPING OF LEFT ATRIAL APPENDAGE  1. CV-Sinus Bradycardia- continue Lopressor, Isordil for radial artery... EPW remain in place, as discussed with Dr. Kipp Brood, for possible removal vs. Cutting as patient is on Eliquis or previously diagnosed DVT.Marland Kitchen defer to Dr. Orvan Seen 2. Pulm- no acute issues, off oxygen, continue IS 3. Renal- creatinine stable, weight/swelling improved- will transition to oral Lasix 40 mg daily, K is at 4.0 continue supplement 4. Uvula swelling improved, continue supportive measures 5. Dispo- patient stable, maintaining Sinus Bradycardia, EPW cut vs removal tomorrow, transition to oral lasix.Marland Kitchen if patient remains stable possibly ready for d/c in next 24-48 hours   LOS: 9 days    Ellwood Handler, PA-C 12/08/2020   Agree with above Will keep wires dispo planning  Tahji [place] O Kinzlee Selvy

## 2020-12-09 LAB — BASIC METABOLIC PANEL
Anion gap: 6 (ref 5–15)
BUN: 22 mg/dL (ref 8–23)
CO2: 28 mmol/L (ref 22–32)
Calcium: 8.5 mg/dL — ABNORMAL LOW (ref 8.9–10.3)
Chloride: 102 mmol/L (ref 98–111)
Creatinine, Ser: 1.22 mg/dL — ABNORMAL HIGH (ref 0.44–1.00)
GFR, Estimated: 46 mL/min — ABNORMAL LOW (ref >60)
Glucose, Bld: 91 mg/dL (ref 70–99)
Potassium: 4.1 mmol/L (ref 3.5–5.1)
Sodium: 136 mmol/L (ref 135–145)

## 2020-12-09 MED ORDER — ASPIRIN 81 MG PO CHEW: 81.0000 mg | CHEWABLE_TABLET | Freq: Every day | ORAL | Status: DC

## 2020-12-09 MED ORDER — ASPIRIN 81 MG PO CHEW
81.0000 mg | CHEWABLE_TABLET | Freq: Every day | ORAL | Status: DC
  Administered 2020-12-10: 81 mg
  Filled 2020-12-09 (×2): qty 1

## 2020-12-09 MED ORDER — ASPIRIN EC 81 MG PO TBEC
81.0000 mg | DELAYED_RELEASE_TABLET | Freq: Every day | ORAL | Status: DC
  Administered 2020-12-11: 81 mg via ORAL
  Filled 2020-12-09: qty 1

## 2020-12-09 MED ORDER — METOCLOPRAMIDE HCL 5 MG/ML IJ SOLN
10.0000 mg | Freq: Four times a day (QID) | INTRAMUSCULAR | Status: AC
  Administered 2020-12-09: 10 mg via INTRAVENOUS
  Filled 2020-12-09: qty 2

## 2020-12-09 MED ORDER — ASPIRIN EC 81 MG PO TBEC: 81.0000 mg | DELAYED_RELEASE_TABLET | Freq: Every day | ORAL | Status: DC

## 2020-12-09 NOTE — Progress Notes (Signed)
      [address]       [place] [address]             [phone]     Per Dr Orvan Seen instructions, EPW's cut at skin for removal d/t being on [facility] RX (apixaban). Patient tolerated well.  John Giovanni, PA-C

## 2020-12-09 NOTE — Progress Notes (Signed)
CARDIAC REHAB PHASE I   PRE:  Rate/Rhythm: 68 SR  BP:  Sitting: 121/66      SaO2: 95 RA  MODE:  Ambulation: 460 ft   POST:  Rate/Rhythm: 92 SR   Pt ambulated 423ft in hallway independently without device. Pt denies CP, SOB, or dizziness. Pt returned to recliner. D/c education completed with pt. Pt educated on importance of site care and monitoring incisions daily. Encouraged continued IS use, walks, and sternal precautions. Pt given in-the-tube sheet and heart healthy diet. Reviewed restrictions and exercise guidelines. Will refer to CRP II [place].  [phone] Rufina Falco, RN BSN 12/09/2020 10:33 AM

## 2020-12-09 NOTE — Progress Notes (Addendum)
      [address]       [place] [address]             [phone]        5 Days Post-Op Procedure(s) (LRB): CORONARY ARTERY BYPASS GRAFTING (CABG), ON PUMP, TIMES THREE, USING LEFT INTERNAL MAMMARY ARTERY AND LEFT RADIAL ARTERY (N/A) TRANSESOPHAGEAL ECHOCARDIOGRAM (TEE) (N/A) INDOCYANINE GREEN FLUORESCENCE IMAGING (ICG) (N/A) Left RADIAL ARTERY HARVEST (Left) CLIPPING OF LEFT ATRIAL APPENDAGE  Subjective: Patient has had intermittent nausea since surgery so she is not eating a lot but this is slowly improving. She feels fine this am. Hating pad helped a little with neck pain (she fell prior to surgery and "tweaked" her neck).  Objective: Vital signs in last 24 hours: Temp:  [97.6 F (36.4 C)-98.6 F (37 C)] 98.6 F (37 C) (06/06 0355) Pulse Rate:  [71-99] 72 (06/06 0355) Cardiac Rhythm: Normal sinus rhythm (06/05 2100) Resp:  [15-19] 18 (06/06 0355) BP: (106-139)/(51-83) 127/58 (06/06 0355) SpO2:  [95 %-100 %] 95 % (06/06 0355) Weight:  [94.7 kg] 94.7 kg (06/06 0355)  Pre op weight 90.9 kg Current Weight  12/09/20 94.7 kg      Intake/Output from previous day: 06/05 0701 - 06/06 0700 In: 230 [P.O.:230] Out: -    Physical Exam:  Cardiovascular: RRR Pulmonary: Slightly diminished bibasilar breath sounds Abdomen: Soft, non tender, bowel sounds present. Extremities: Mild bilateral lower extremity edema. Motor/sensory intact LUE. No numbness left hand Wounds: Sternal wound is clean and dry and there is slight erythema along skin edges  Lab Results: CBC: Recent Labs    12/07/20 0459  WBC 9.6  HGB 10.9*  HCT 32.8*  PLT 170   BMET:  Recent Labs    12/07/20 0222 12/08/20 0556  NA 134* 136  K 4.4 4.0  CL 102 103  CO2 25 26  GLUCOSE 99 98  BUN 27* 23  CREATININE 1.37* 1.33*  CALCIUM 8.0* 7.9*    PT/INR:  Lab Results  Component Value Date   INR 1.3 (H) 12/04/2020   INR 1.0 12/03/2020   ABG:  INR: Will add last result for INR,  ABG once components are confirmed Will add last 4 CBG results once components are confirmed  Assessment/Plan:  1. CV - History of a DVT. SR with HR in the 70's. On Lopressor 12.5 mg bid, Isordil 10 mg tid. Will decrease ec asa as on Apixaban 2.  Pulmonary - On room air. Encourage incentive spirometer. 3. Volume Overload - On Lasix 40 mg daily as still above pre op weight 4.  Expected post op acute blood loss anemia - H and H 06/04 stable at 10.9 and 32.8 5. Mild renal insufficiency-creatinine yesterday 1.33. Will re check 6. As discussed with Dr. Orvan Seen, will cut EPW  (patient on Apixaban) 7. Regarding nausea, Zofran PRN, scheduled Reglan. Monitor  8. Likely discharge am  [place] B[place] M ZimmermanPA-C 12/09/2020,7:05 AM

## 2020-12-09 NOTE — Progress Notes (Signed)
eliquis to be given tonight per Dr. Roxan Hockey. Will continue to monitor

## 2020-12-09 NOTE — Care Management Important Message (Signed)
Important Message  Patient Details  Name: Krystal Mcpherson MRN: [id number] Date of Birth: April 27, 1941   Medicare Important Message Given:  Yes     Shelda Altes 12/09/2020, 10:30 AM

## 2020-12-09 NOTE — Progress Notes (Signed)
Paged Dr. Roxan Hockey concerning giving scheduled eliquis. Pacing wires cut in AM by PA. Morning eliqius held.   Awaiting call back

## 2020-12-09 NOTE — Progress Notes (Signed)
Mobility Specialist: Progress Note   12/09/20 1247  Mobility  Activity Ambulated in hall  Level of Assistance Independent  Assistive Device None  Distance Ambulated (ft) 460 ft  Mobility Ambulated independently in hallway  Mobility Response Tolerated well  Mobility performed by Mobility specialist  Bed Position Chair  $Mobility charge 1 Mobility   During Mobility: 103 HR Post-Mobility: 88 HR, 166/66 BP, 98% SpO2  Pt asx during ambulation. Pt to recliner after walk with call bell in reach.   [facility] Tomoya Ringwald Mobility Specialist Mobility Specialist Phone: [phone]

## 2020-12-10 DIAGNOSIS — Z951 Presence of aortocoronary bypass graft: Secondary | ICD-10-CM

## 2020-12-10 DIAGNOSIS — I825Y1 Chronic embolism and thrombosis of unspecified deep veins of right proximal lower extremity: Secondary | ICD-10-CM

## 2020-12-10 DIAGNOSIS — I471 Supraventricular tachycardia: Secondary | ICD-10-CM

## 2020-12-10 MED ORDER — AMIODARONE HCL 200 MG PO TABS
400.0000 mg | ORAL_TABLET | Freq: Two times a day (BID) | ORAL | Status: AC
  Administered 2020-12-10 (×2): 400 mg via ORAL
  Filled 2020-12-10 (×2): qty 2

## 2020-12-10 MED ORDER — ROSUVASTATIN CALCIUM 5 MG PO TABS: 5.0000 mg | ORAL_TABLET | Freq: Every day | ORAL | 3 refills | Status: DC

## 2020-12-10 MED ORDER — METOPROLOL TARTRATE 25 MG/10 ML ORAL SUSPENSION: 25.0000 mg | Freq: Two times a day (BID) | ORAL | Status: DC

## 2020-12-10 MED ORDER — POTASSIUM CHLORIDE CRYS ER 20 MEQ PO TBCR: 20.0000 meq | EXTENDED_RELEASE_TABLET | Freq: Every day | ORAL | 0 refills | Status: DC

## 2020-12-10 MED ORDER — FUROSEMIDE 40 MG PO TABS: 40.0000 mg | ORAL_TABLET | Freq: Every day | ORAL | 0 refills | Status: DC

## 2020-12-10 MED ORDER — METOPROLOL TARTRATE 25 MG PO TABS
25.0000 mg | ORAL_TABLET | Freq: Two times a day (BID) | ORAL | Status: DC
  Administered 2020-12-10 – 2020-12-11 (×2): 25 mg via ORAL
  Filled 2020-12-10 (×3): qty 1

## 2020-12-10 MED ORDER — IRBESARTAN 150 MG PO TABS: 150.0000 mg | ORAL_TABLET | Freq: Every day | ORAL | Status: DC

## 2020-12-10 MED ORDER — METOPROLOL TARTRATE 12.5 MG HALF TABLET
12.5000 mg | ORAL_TABLET | Freq: Once | ORAL | Status: AC
  Administered 2020-12-10: 12.5 mg via ORAL

## 2020-12-10 MED ORDER — TRAMADOL HCL 50 MG PO TABS: 50.0000 mg | ORAL_TABLET | Freq: Four times a day (QID) | ORAL | 0 refills | Status: DC | PRN

## 2020-12-10 MED ORDER — ASPIRIN 81 MG PO TBEC: 81.0000 mg | DELAYED_RELEASE_TABLET | Freq: Every day | ORAL | 11 refills | Status: DC

## 2020-12-10 MED ORDER — AMIODARONE HCL 200 MG PO TABS: 200.0000 mg | ORAL_TABLET | Freq: Two times a day (BID) | ORAL | 1 refills | Status: DC

## 2020-12-10 MED ORDER — ISOSORBIDE DINITRATE 10 MG PO TABS: 10.0000 mg | ORAL_TABLET | Freq: Three times a day (TID) | ORAL | 0 refills | Status: DC

## 2020-12-10 MED ORDER — METOPROLOL TARTRATE 25 MG PO TABS: 12.5000 mg | ORAL_TABLET | Freq: Two times a day (BID) | ORAL | 1 refills | Status: DC

## 2020-12-10 MED ORDER — METOPROLOL TARTRATE 25 MG PO TABS: 25.0000 mg | ORAL_TABLET | Freq: Two times a day (BID) | ORAL | 1 refills | Status: DC

## 2020-12-10 MED FILL — Electrolyte-R (PH 7.4) Solution: INTRAVENOUS | Qty: 4000 | Status: AC

## 2020-12-10 MED FILL — Heparin Sodium (Porcine) Inj 1000 Unit/ML: INTRAMUSCULAR | Qty: 20 | Status: AC

## 2020-12-10 MED FILL — Mannitol IV Soln 20%: INTRAVENOUS | Qty: 500 | Status: AC

## 2020-12-10 MED FILL — Sodium Chloride IV Soln 0.9%: INTRAVENOUS | Qty: 3000 | Status: AC

## 2020-12-10 MED FILL — Sodium Bicarbonate IV Soln 8.4%: INTRAVENOUS | Qty: 50 | Status: AC

## 2020-12-10 NOTE — Progress Notes (Signed)
Patient had some a fib, PACs, PVCs this am. As discussed with Dr. Orvan Seen, will increase Lopressor. He does not want Amiodarone unless rate is not well controlled. She is already on Apixaban for DVT. Monitor. May be able to discharge later today vs am.

## 2020-12-10 NOTE — Discharge Instructions (Signed)
Discharge Instructions:  1. You may shower, please wash incisions daily with soap and water and keep dry.  If you wish to cover wounds with dressing you may do so but please keep clean and change daily.  No tub baths or swimming until incisions have completely healed.  If your incisions become red or develop any drainage please call our office at [phone]  2. No Driving until cleared by Dr. Orvan Seen office and you are no longer using narcotic pain medications  3. Monitor your weight daily.. Please use the same scale and weigh at same time... If you gain 5-10 lbs in 48 hours with associated lower extremity swelling, please contact our office at [phone]  4. Fever of 101.5 for at least 24 hours with no source, please contact our office at [phone]  5. Activity- up as tolerated, please walk at least 3 times per day.  Avoid strenuous activity, no lifting, pushing, or pulling with your arms over 8-10 lbs for a minimum of 6 weeks  6. If any questions or concerns arise, please do not hesitate to contact our office at [phone]  Information on my medicine - ELIQUIS (apixaban)  This medication education was reviewed with me or my healthcare representative as part of my discharge preparation.  The pharmacist that spoke with me during my hospital stay was:    Why was Eliquis prescribed for you? Eliquis was prescribed to treat blood clots that may have been found in the veins of your legs (deep vein thrombosis) or in your lungs (pulmonary embolism) and to reduce the risk of them occurring again.  What do You need to know about Eliquis ? Continue Eliquis 5 mg tablet taken TWICE daily.  Eliquis may be taken with or without food.   Try to take the dose about the same time in the morning and in the evening. If you have difficulty swallowing the tablet whole please discuss with your pharmacist how to take the medication safely.  Take Eliquis exactly as prescribed and DO NOT stop taking  Eliquis without talking to the doctor who prescribed the medication.  Stopping may increase your risk of developing a new blood clot.  Refill your prescription before you run out.  After discharge, you should have regular check-up appointments with your healthcare provider that is prescribing your Eliquis.    What do you do if you miss a dose? If a dose of ELIQUIS is not taken at the scheduled time, take it as soon as possible on the same day and twice-daily administration should be resumed. The dose should not be doubled to make up for a missed dose.  Important Safety Information A possible side effect of Eliquis is bleeding. You should call your healthcare provider right away if you experience any of the following: ? Bleeding from an injury or your nose that does not stop. ? Unusual colored urine (red or dark brown) or unusual colored stools (red or black). ? Unusual bruising for unknown reasons. ? A serious fall or if you hit your head (even if there is no bleeding).  Some medicines may interact with Eliquis and might increase your risk of bleeding or clotting while on Eliquis. To help avoid this, consult your healthcare provider or pharmacist prior to using any new prescription or non-prescription medications, including herbals, vitamins, non-steroidal anti-inflammatory drugs (NSAIDs) and supplements.  This website has more information on Eliquis (apixaban): http://www.eliquis.com/eliquis/home   Prediabetes Eating Plan Prediabetes is a condition that causes blood sugar (glucose) levels to  be higher than normal. This increases the risk for developing type 2 diabetes (type 2 diabetes mellitus). Working with a health care provider or nutrition specialist (dietitian) to make diet and lifestyle changes can help prevent the onset of diabetes. These changes may help you:  Control your blood glucose levels.  Improve your cholesterol levels.  Manage your blood pressure. What are tips for  following this plan? Reading food labels  Read food labels to check the amount of fat, salt (sodium), and sugar in prepackaged foods. Avoid foods that have: ? Saturated fats. ? Trans fats. ? Added sugars.  Avoid foods that have more than 300 milligrams (mg) of sodium per serving. Limit your sodium intake to less than 2,300 mg each day. Shopping  Avoid buying pre-made and processed foods.  Avoid buying drinks with added sugar. Cooking  Cook with olive oil. Do not use butter, lard, or ghee.  Bake, broil, grill, steam, or boil foods. Avoid frying. Meal planning  Work with your dietitian to create an eating plan that is right for you. This may include tracking how many calories you take in each day. Use a food diary, notebook, or mobile application to track what you eat at each meal.  Consider following a Mediterranean diet. This includes: ? Eating several servings of fresh fruits and vegetables each day. ? Eating fish at least twice a week. ? Eating one serving each day of whole grains, beans, nuts, and seeds. ? Using olive oil instead of other fats. ? Limiting alcohol. ? Limiting red meat. ? Using nonfat or low-fat dairy products.  Consider following a plant-based diet. This includes dietary choices that focus on eating mostly vegetables and fruit, grains, beans, nuts, and seeds.  If you have high blood pressure, you may need to limit your sodium intake or follow a diet such as the DASH (Dietary Approaches to Stop Hypertension) eating plan. The DASH diet aims to lower high blood pressure.   Lifestyle  Set weight loss goals with help from your health care team. It is recommended that most people with prediabetes lose 7% of their body weight.  Exercise for at least 30 minutes 5 or more days a week.  Attend a support group or seek support from a mental health counselor.  Take over-the-counter and prescription medicines only as told by your health care provider. What foods are  recommended? Fruits Berries. Bananas. Apples. Oranges. Grapes. Papaya. Mango. Pomegranate. Kiwi. Grapefruit. Cherries. Vegetables Lettuce. Spinach. Peas. Beets. Cauliflower. Cabbage. Broccoli. Carrots. Tomatoes. Squash. Eggplant. Herbs. Peppers. Onions. Cucumbers. Brussels sprouts. Grains Whole grains, such as whole-wheat or whole-grain breads, crackers, cereals, and pasta. Unsweetened oatmeal. Bulgur. Barley. Quinoa. Brown rice. Corn or whole-wheat flour tortillas or taco shells. Meats and other proteins Seafood. Poultry without skin. Lean cuts of pork and beef. Tofu. Eggs. Nuts. Beans. Dairy Low-fat or fat-free dairy products, such as yogurt, cottage cheese, and cheese. Beverages Water. Tea. Coffee. Sugar-free or diet soda. Seltzer water. Low-fat or nonfat milk. Milk alternatives, such as soy or almond milk. Fats and oils Olive oil. Canola oil. Sunflower oil. Grapeseed oil. Avocado. Walnuts. Sweets and desserts Sugar-free or low-fat pudding. Sugar-free or low-fat ice cream and other frozen treats. Seasonings and condiments Herbs. Sodium-free spices. Mustard. Relish. Low-salt, low-sugar ketchup. Low-salt, low-sugar barbecue sauce. Low-fat or fat-free mayonnaise. The items listed above may not be a complete list of recommended foods and beverages. Contact a dietitian for more information. What foods are not recommended? Fruits Fruits canned with syrup. Vegetables Canned  vegetables. Frozen vegetables with butter or cream sauce. Grains Refined white flour and flour products, such as bread, pasta, snack foods, and cereals. Meats and other proteins Fatty cuts of meat. Poultry with skin. Breaded or fried meat. Processed meats. Dairy Full-fat yogurt, cheese, or milk. Beverages Sweetened drinks, such as iced tea and soda. Fats and oils Butter. Lard. Ghee. Sweets and desserts Baked goods, such as cake, cupcakes, pastries, cookies, and cheesecake. Seasonings and condiments Spice mixes  with added salt. Ketchup. Barbecue sauce. Mayonnaise. The items listed above may not be a complete list of foods and beverages that are not recommended. Contact a dietitian for more information. Where to find more information  American Diabetes Association: www.diabetes.org Summary  You may need to make diet and lifestyle changes to help prevent the onset of diabetes. These changes can help you control blood sugar, improve cholesterol levels, and manage blood pressure.  Set weight loss goals with help from your health care team. It is recommended that most people with prediabetes lose 7% of their body weight.  Consider following a Mediterranean diet. This includes eating plenty of fresh fruits and vegetables, whole grains, beans, nuts, seeds, fish, and low-fat dairy, and using olive oil instead of other fats. This information is not intended to replace advice given to you by your health care provider. Make sure you discuss any questions you have with your health care provider. Document Revised: 09/21/2019 Document Reviewed: 09/21/2019 Elsevier Patient Education  [place].

## 2020-12-10 NOTE — Plan of Care (Signed)
  Problem: Health Behavior/Discharge Planning: Goal: Ability to manage health-related needs will improve Outcome: Progressing   Problem: Clinical Measurements: Goal: Will remain free from infection Outcome: Progressing Goal: Diagnostic test results will improve Outcome: Progressing   

## 2020-12-10 NOTE — Progress Notes (Addendum)
Of note, patient prior to surgery was on Atenolol 25 mg daily. I spoke with Dr. Marlou Porch regarding patient's post op a fib. He recommends starting oral Amiodarone 400 mg bid for today and titrate downward. Also, will not change her to Atenolol (as taken prior to surgery) but will continue higher dose Lopressor. Hopefully, Amiodarone will be used short term. She has been on Apixaban as has a recent DVT history. Once rate better controlled, will discharge

## 2020-12-10 NOTE — Progress Notes (Signed)
Progress Note  Patient Name: Krystal Mcpherson Date of Encounter: 12/10/2020  [facility] Cardiologist: Candee Furbish, MD   Subjective   Wants to go home, no sig sx w/ PAF (seen early post-op but shorter runs now)  Inpatient Medications    Scheduled Meds: . amiodarone  400 mg Oral BID  . apixaban  5 mg Oral BID  . aspirin EC  81 mg Oral Daily   Or  . aspirin  81 mg Per Tube Daily  . bisacodyl  10 mg Oral Daily   Or  . bisacodyl  10 mg Rectal Daily  . docusate sodium  200 mg Oral Daily  . furosemide  40 mg Oral Daily  . isosorbide dinitrate  10 mg Oral TID  . loratadine  10 mg Oral Daily  . mouth rinse  15 mL Mouth Rinse BID  . metoCLOPramide (REGLAN) injection  10 mg Intravenous Q6H  . metoprolol tartrate  25 mg Oral BID  . pantoprazole  40 mg Oral Daily  . potassium chloride  20 mEq Oral Daily  . rosuvastatin  5 mg Oral Daily  . sodium chloride flush  10-40 mL Intracatheter Q12H  . topiramate  50 mg Oral Q2200   Continuous Infusions:  PRN Meds: levalbuterol, menthol-cetylpyridinium, metoprolol tartrate, ondansetron (ZOFRAN) IV, oxyCODONE, sodium chloride flush, traMADol   Vital Signs    Vitals:   12/10/20 0029 12/10/20 0418 12/10/20 0731 12/10/20 1100  BP: (!) 142/59 128/63 (!) 175/82 121/65  Pulse: 74 73 81 74  Resp: 16 14 19 17   Temp: 97.7 F (36.5 C) 98.1 F (36.7 C) 97.6 F (36.4 C) 97.9 F (36.6 C)  TempSrc: Oral Oral Oral Oral  SpO2: 99% 98% 93% 100%  Weight:  94.2 kg    Height:        Intake/Output Summary (Last 24 hours) at 12/10/2020 1107 Last data filed at 12/10/2020 0107 Gross per 24 hour  Intake 0 ml  Output --  Net 0 ml   Last 3 Weights 12/10/2020 12/09/2020 12/08/2020  Weight (lbs) 207 lb 11.2 oz 208 lb 12.8 oz 208 lb 1.8 oz  Weight (kg) 94.212 kg 94.711 kg 94.4 kg      Telemetry    SR, occ PACs, short runs PAF vs SVT, Longest one 8 sec - Personally Reviewed  ECG    None today - Personally Reviewed  Physical Exam   GEN: No acute  distress.   Neck: mild JVD seen, difficult to assess 2nd body habitun Cardiac: RRR, no murmur, no rubs, or gallops.  Respiratory: diminished to auscultation bilaterally with rales L>R bases. GI: Soft, nontender, non-distended  MS: trace LE edema R; No deformity. Neuro:  Nonfocal  Psych: Normal affect   Labs    High Sensitivity Troponin:   Recent Labs  Lab 11/28/20 0613 11/28/20 0819  TROPONINIHS 1,622* 3,242*      Chemistry Recent Labs  Lab 12/07/20 0222 12/08/20 0556 12/09/20 0958  NA 134* 136 136  K 4.4 4.0 4.1  CL 102 103 102  CO2 25 26 28   GLUCOSE 99 98 91  BUN 27* 23 22  CREATININE 1.37* 1.33* 1.22*  CALCIUM 8.0* 7.9* 8.5*  GFRNONAA 40* 41* 46*  ANIONGAP 7 7 6      Hematology Recent Labs  Lab 12/05/20 1626 12/06/20 0439 12/07/20 0459  WBC 10.5 9.9 9.6  RBC 3.69* 3.59* 3.85*  HGB 10.3* 10.1* 10.9*  HCT 32.2* 31.3* 32.8*  MCV 87.3 87.2 85.2  MCH 27.9 28.1  28.3  MCHC 32.0 32.3 33.2  RDW 14.1 14.1 14.3  PLT 145* 144* 170   Lab Results  Component Value Date   CHOL 154 11/29/2020   HDL 49 11/29/2020   LDLCALC 94 11/29/2020   TRIG 54 11/29/2020   CHOLHDL 3.1 11/29/2020   Lab Results  Component Value Date   TSH 2.743 12/02/2020   Lab Results  Component Value Date   HGBA1C 5.8 (H) 11/29/2020   BNPNo results for input(s): BNP, PROBNP in the last 168 hours.   DDimer No results for input(s): DDIMER in the last 168 hours.   Radiology    No results found.  Cardiac Studies  2D echo 11/28/20 1. Left ventricular ejection fraction, by estimation, is 55 to 60%. The  left ventricle has normal function. The left ventricle demonstrates  regional wall motion abnormalities (see scoring diagram/findings for  description). Left ventricular diastolic  parameters are consistent with Grade I diastolic dysfunction (impaired  relaxation).  2. Right ventricular systolic function is normal. The right ventricular  size is normal. There is normal pulmonary  artery systolic pressure. The  estimated right ventricular systolic pressure is 12.8 mmHg.  3. The mitral valve is grossly normal. Trivial mitral valve  regurgitation. No evidence of mitral stenosis.  4. The aortic valve is tricuspid. Aortic valve regurgitation is not  visualized. No aortic stenosis is present.  5. The inferior vena cava is normal in size with greater than 50%  respiratory variability, suggesting right atrial pressure of 3 mmHg.   Cath 11/28/20  Prox LAD to Mid LAD lesion is 90% stenosed.  1st Diag lesion is 80% stenosed.  1st Mrg lesion is 90% stenosed.  Mid Cx lesion is 100% stenosed.  Prox RCA lesion is 30% stenosed.  Prox RCA to Mid RCA lesion is 50% stenosed.  LV end diastolic pressure is mildly elevated.  1. 3 vessel obstructive CAD - 90% proximal to mid LAD, 80% first diagonal - 90% large OM1, 100% LCX post OM1 with right to left collaterals. - Patent stent in the proximal RCA. 50% mid vessel 2. Mildly elevated LVEDP  12/04/2020 Coronary Artery Bypass Grafting x 3 Left Internal Mammary Artery to Distal Left Anterior Descending Coronary Artery; l eft radial artery graft to  Obtuse Marginal Branch of Left Circumflex Coronary Artery >> ramus intermedius coronary artery as a sequenced graft  Patient Profile     80 y.o. female withCAD with prior stenting RCA in 05/2011 with low risk Myoview in 2016, palpitations with brief runs of SVT noted on monitor in 2020, DVT diagnosed on 11/01/2020 on Eliquis, hypertension, hyperlipidemia not on statin, GERD, arthritis, rosacea currently on Doxycycline, migraines,Chiari malformation s/p repair 2001,and Raynaud's syndrome. Presented to [facility] with stuttering CP/indigestion for several days, found to have NSTEMI and MVCAD, underwent CABG 12/04/2020.  Assessment & Plan    1. S/p CABG after NSTEMI - on ASA at 81 mg qd 2nd Eliquis, would ordinarily add Plavix, but to decrease bleeding risk -  ok to d/c on  ASA/Eliquis alone. - on isordil for radial artery site - on BB - on low-dose Crestor 2nd myopathies, discuss at f/u if she can continue this or needs referral to [facility]. - continue Lasix short-term as outpt, still w/ some volume overload (was on HCTZ 25 mg qd pta) so may need low-dose Lasix going forward.  2. HLD - problems w/ atorvastatin in the past - see how she tolerates low-dose Crestor  - [facility] if does not tolerate  Crestor  3. HTN - Isordil added for radial site - Atenolol changed to metoprolol for PAF, SBP 110s at times, so no dose change  4. PAF - had post-op, still w/ some short runs on tele - continue BB - on Eliquis already for DVT    For questions or updates, please contact [place] Please consult [email or web address] for contact info under        Signed, Rosaria Ferries, PA-C  12/10/2020, 11:07 AM

## 2020-12-10 NOTE — Progress Notes (Signed)
Mobility Specialist: Progress Note   12/10/20 1419  Mobility  Activity Ambulated in hall  Level of Assistance Independent  Assistive Device None  Distance Ambulated (ft) 470 ft  Mobility Ambulated independently in hallway  Mobility Response Tolerated well  Mobility performed by Mobility specialist  Bed Position Chair  $Mobility charge 1 Mobility   Pre-Mobility: 81 HR Post-Mobility: 91 HR, 161/72 BP, 96% SpO2  Pt said she is feeling much better than earlier today. Pt asx during ambulation and to recliner after walk with call bell in reach.   [facility] Krystal Mcpherson Mobility Specialist Mobility Specialist Phone: [phone]

## 2020-12-10 NOTE — Progress Notes (Signed)
      [name]       [place] [address]             [phone]        6 Days Post-Op Procedure(s) (LRB): CORONARY ARTERY BYPASS GRAFTING (CABG), ON PUMP, TIMES THREE, USING LEFT INTERNAL MAMMARY ARTERY AND LEFT RADIAL ARTERY (N/A) TRANSESOPHAGEAL ECHOCARDIOGRAM (TEE) (N/A) INDOCYANINE GREEN FLUORESCENCE IMAGING (ICG) (N/A) Left RADIAL ARTERY HARVEST (Left) CLIPPING OF LEFT ATRIAL APPENDAGE  Subjective: Patient states no further nausea. She has no complaint this am and wants to go home.  Objective: Vital signs in last 24 hours: Temp:  [97.7 F (36.5 C)-98.5 F (36.9 C)] 98.1 F (36.7 C) (06/07 0418) Pulse Rate:  [70-88] 73 (06/07 0418) Cardiac Rhythm: Normal sinus rhythm (06/06 2300) Resp:  [14-18] 14 (06/07 0418) BP: (119-156)/(54-90) 128/63 (06/07 0418) SpO2:  [96 %-99 %] 98 % (06/07 0418) Weight:  [94.2 kg] 94.2 kg (06/07 0418)  Pre op weight 90.9 kg Current Weight  12/10/20 94.2 kg      Intake/Output from previous day: 06/06 0701 - 06/07 0700 In: 360 [P.O.:360] Out: -    Physical Exam:  Cardiovascular: RRR Pulmonary: Slightly diminished bibasilar breath sounds L>R Abdomen: Soft, non tender, bowel sounds present. Extremities: Mild bilateral lower extremity edema. Motor/sensory intact LUE. No numbness left hand Wounds: Sternal wound is clean and dry and there is slight erythema along skin edges (probable suture reaction) but no sign of infection  Lab Results: CBC: No results for input(s): WBC, HGB, HCT, PLT in the last 72 hours. BMET:  Recent Labs    12/08/20 0556 12/09/20 0958  NA 136 136  K 4.0 4.1  CL 103 102  CO2 26 28  GLUCOSE 98 91  BUN 23 22  CREATININE 1.33* 1.22*  CALCIUM 7.9* 8.5*    PT/INR:  Lab Results  Component Value Date   INR 1.3 (H) 12/04/2020   INR 1.0 12/03/2020   ABG:  INR: Will add last result for INR, ABG once components are confirmed Will add last 4 CBG results once components are  confirmed  Assessment/Plan:  1. CV - History of a DVT. SR with HR in the 70-80's. On Lopressor 12.5 mg bid, Isordil 10 mg tid, baby ec asa, and Apixaban 2.  Pulmonary - On room air. Encourage incentive spirometer. 3. Volume Overload - On Lasix 40 mg daily as still above pre op weight 4.  Expected post op acute blood loss anemia - H and H 06/04 stable at 10.9 and 32.8  5. Mild renal insufficiency-creatinine yesterday decreased 1.22.  6. Discharge   Krystal Hollar M ZimmermanPA-C 12/10/2020,7:07 AM

## 2020-12-10 NOTE — Progress Notes (Signed)
CARDIAC REHAB PHASE I   Went to check on pt. Pt upset states she felt her chest fluttering in bathroom and was told she was in Afib. EKG obtained, rate 80-100 SR with PACs and PVCs during my time with pt. Pt on anticoagulation, had MAZE procedure during surgery. EKG obtained, PA aware. Provided support and encouragement to pt. Pt states she can feel when she is in it, and knows to stop and rest. Encouraged pt to continue to listen to her body, and ease back into normal activity as pt thinks she may have been moving too fast this morning. Will f/u later if pt remains in-house.  [id number] Rufina Falco, RN BSN 12/10/2020 8:53 AM

## 2020-12-11 DIAGNOSIS — I480 Paroxysmal atrial fibrillation: Secondary | ICD-10-CM

## 2020-12-11 MED ORDER — AMIODARONE HCL 200 MG PO TABS: 200.0000 mg | ORAL_TABLET | Freq: Every day | ORAL | 0 refills | Status: DC

## 2020-12-11 MED ORDER — AMIODARONE HCL 200 MG PO TABS
200.0000 mg | ORAL_TABLET | Freq: Every day | ORAL | Status: DC
  Administered 2020-12-11: 200 mg via ORAL
  Filled 2020-12-11: qty 1

## 2020-12-11 NOTE — Progress Notes (Signed)
Progress Note  Patient Name: Krystal Mcpherson Date of Encounter: 12/11/2020  [facility] Cardiologist: Candee Furbish, MD   Subjective   Feeling well.  Eager to go home.  Inpatient Medications    Scheduled Meds: . amiodarone  200 mg Oral Daily  . apixaban  5 mg Oral BID  . aspirin EC  81 mg Oral Daily   Or  . aspirin  81 mg Per Tube Daily  . bisacodyl  10 mg Oral Daily   Or  . bisacodyl  10 mg Rectal Daily  . docusate sodium  200 mg Oral Daily  . isosorbide dinitrate  10 mg Oral TID  . loratadine  10 mg Oral Daily  . mouth rinse  15 mL Mouth Rinse BID  . metoprolol tartrate  25 mg Oral BID  . pantoprazole  40 mg Oral Daily  . rosuvastatin  5 mg Oral Daily  . sodium chloride flush  10-40 mL Intracatheter Q12H  . topiramate  50 mg Oral Q2200   Continuous Infusions:  PRN Meds: levalbuterol, menthol-cetylpyridinium, metoprolol tartrate, ondansetron (ZOFRAN) IV, oxyCODONE, sodium chloride flush, traMADol   Vital Signs    Vitals:   12/10/20 2329 12/11/20 0351 12/11/20 0352 12/11/20 0721  BP: (!) 147/65 127/65  (!) 150/63  Pulse: 77 68  69  Resp:  17  13  Temp:  98.6 F (37 C)  98.6 F (37 C)  TempSrc:  Oral  Oral  SpO2: 96% 98%    Weight:   92.2 kg   Height:        Intake/Output Summary (Last 24 hours) at 12/11/2020 0949 Last data filed at 12/10/2020 1800 Gross per 24 hour  Intake 0 ml  Output --  Net 0 ml   Last 3 Weights 12/11/2020 12/10/2020 12/09/2020  Weight (lbs) 203 lb 4.8 oz 207 lb 11.2 oz 208 lb 12.8 oz  Weight (kg) 92.216 kg 94.212 kg 94.711 kg      Telemetry    Sinus rhythm.  PACs No events.  - Personally Reviewed  ECG    12/10/20: Sinus rhythm.  Rate 82 bpm.  Lateral TWI.  PACs.  - Personally Reviewed  Physical Exam   VS:  BP (!) 150/63 (BP Location: Right Arm)   Pulse 69   Temp 98.6 F (37 C) (Oral)   Resp 13   Ht 5\' 6"  (1.676 m)   Wt 92.2 kg   SpO2 98%   BMI 32.81 kg/m  , BMI Body mass index is 32.81 kg/m. GENERAL:  Well appearing HEENT:  Pupils equal round and reactive, fundi not visualized, oral mucosa unremarkable NECK:  No jugular venous distention, waveform within normal limits, carotid upstroke brisk and symmetric, no bruits LUNGS:  Clear to auscultation bilaterally HEART:  RRR.  PMI not displaced or sustained,S1 and S2 within normal limits, no S3, no S4, no clicks, no rubs, no murmurs ABD:  Flat, positive bowel sounds normal in frequency in pitch, no bruits, no rebound, no guarding, no midline pulsatile mass, no hepatomegaly, no splenomegaly EXT:  2 plus pulses throughout, no edema, no cyanosis no clubbing SKIN:  No rashes no nodules.  Healing sternotomy. NEURO:  Cranial nerves II through XII grossly intact, motor grossly intact throughout PSYCH:  Cognitively intact, oriented to person place and time   Labs    High Sensitivity Troponin:   Recent Labs  Lab 11/28/20 0613 11/28/20 0819  TROPONINIHS 1,622* 3,242*      Chemistry Recent Labs  Lab 12/07/20 0222  12/08/20 0556 12/09/20 0958  NA 134* 136 136  K 4.4 4.0 4.1  CL 102 103 102  CO2 25 26 28   GLUCOSE 99 98 91  BUN 27* 23 22  CREATININE 1.37* 1.33* 1.22*  CALCIUM 8.0* 7.9* 8.5*  GFRNONAA 40* 41* 46*  ANIONGAP 7 7 6      Hematology Recent Labs  Lab 12/05/20 1626 12/06/20 0439 12/07/20 0459  WBC 10.5 9.9 9.6  RBC 3.69* 3.59* 3.85*  HGB 10.3* 10.1* 10.9*  HCT 32.2* 31.3* 32.8*  MCV 87.3 87.2 85.2  MCH 27.9 28.1 28.3  MCHC 32.0 32.3 33.2  RDW 14.1 14.1 14.3  PLT 145* 144* 170    BNPNo results for input(s): BNP, PROBNP in the last 168 hours.   DDimer No results for input(s): DDIMER in the last 168 hours.   Radiology    No results found.  Cardiac Studies   Echo 11/28/2020: 1. Left ventricular ejection fraction, by estimation, is 55 to 60%. The  left ventricle has normal function. The left ventricle demonstrates  regional wall motion abnormalities (see scoring diagram/findings for  description). Left ventricular diastolic   parameters are consistent with Grade I diastolic dysfunction (impaired  relaxation).  2. Right ventricular systolic function is normal. The right ventricular  size is normal. There is normal pulmonary artery systolic pressure. The  estimated right ventricular systolic pressure is 45.3 mmHg.  3. The mitral valve is grossly normal. Trivial mitral valve  regurgitation. No evidence of mitral stenosis.  4. The aortic valve is tricuspid. Aortic valve regurgitation is not  visualized. No aortic stenosis is present.  5. The inferior vena cava is normal in size with greater than 50%  respiratory variability, suggesting right atrial pressure of 3 mmHg.   Left heart cath 11/28/2020:  Prox LAD to Mid LAD lesion is 90% stenosed.  1st Diag lesion is 80% stenosed.  1st Mrg lesion is 90% stenosed.  Mid Cx lesion is 100% stenosed.  Prox RCA lesion is 30% stenosed.  Prox RCA to Mid RCA lesion is 50% stenosed.  LV end diastolic pressure is mildly elevated.   1. 3 vessel obstructive CAD    - 90% proximal to mid LAD, 80% first diagonal    - 90% large OM1, 100% LCX post OM1 with right to left collaterals.    - Patent stent in the proximal RCA. 50% mid vessel 2. Mildly elevated LVEDP   Patient Profile     80 y.o. female CAD status post PCI, hypertension, hyperlipidemia, DVT, and bradycardia admitted with NSTEMI and found to have severe three-vessel CAD.  She underwent CABG on 12/04/2020.   Assessment & Plan    # NSTEMI:  #CABG:  #Hyperlipidemia:  She underwent three-vessel CABG on 12/04/2020 (LIMA--> LAD, left radial--> OM,--> RI).  Continue aspirin, metoprolol, and Isordil.  She is not on a P2 Y 12 inhibitor because she is on Eliquis for A. fib and DVT.  LDL was 94 on admission.  She had myalgias on atorvastatin and was started on low-dose rosuvastatin.  Repeat lipids/CMP in 3 months.  If LDL isn't <70 she will need to either increase her statin or add a PCSK9 inhibitor.    #Hypertension:   Blood pressure stable on metoprolol and Isordil.    #PAF:  Continue Eliquis.    She underwent left atrial clipping and MAZE.  She is doing well on metoprolol.  Surgical team's plan for short course of amiodarone.   #DVT:  Continue Eliquis.  Her DVT was  10/2020.  It was unprovoked, though she does state that she was less active than she had been previously.  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  Suspect she will need lifelong anticoagulation unless her physical activity increases significantly.   [facility] will sign off.   Medication Recommendations:  Continue metoprolol Other recommendations (labs, testing, etc):  Lipids/CMP in 2-3 months Follow up as an outpatient: Scheduled 12/27/2020  For questions or updates, please contact [place] Please consult [email or web address] for contact info under        Signed, Skeet Latch, MD  12/11/2020, 9:49 AM

## 2020-12-11 NOTE — Progress Notes (Addendum)
      [address]       [place] [address]             [phone]        7 Days Post-Op Procedure(s) (LRB): CORONARY ARTERY BYPASS GRAFTING (CABG), ON PUMP, TIMES THREE, USING LEFT INTERNAL MAMMARY ARTERY AND LEFT RADIAL ARTERY (N/A) TRANSESOPHAGEAL ECHOCARDIOGRAM (TEE) (N/A) INDOCYANINE GREEN FLUORESCENCE IMAGING (ICG) (N/A) Left RADIAL ARTERY HARVEST (Left) CLIPPING OF LEFT ATRIAL APPENDAGE  Subjective: Patient has no complaint this am and wants to go home.  Objective: Vital signs in last 24 hours: Temp:  [97.6 F (36.4 C)-98.6 F (37 C)] 98.6 F (37 C) (06/08 0351) Pulse Rate:  [68-81] 68 (06/08 0351) Cardiac Rhythm: Normal sinus rhythm (06/07 1900) Resp:  [13-19] 17 (06/08 0351) BP: (121-179)/(65-82) 127/65 (06/08 0351) SpO2:  [91 %-100 %] 98 % (06/08 0351) Weight:  [92.2 kg] 92.2 kg (06/08 0352)  Pre op weight 90.9 kg Current Weight  12/11/20 92.2 kg      Intake/Output from previous day: No intake/output data recorded.   Physical Exam:  Cardiovascular: RRR Pulmonary: Slightly diminished bibasilar breath sounds  Abdomen: Soft, non tender, bowel sounds present. Extremities: Mild bilateral lower extremity edema. Motor/sensory intact LUE. No numbness left hand Wounds: Sternal wound is clean and dry and there is slight erythema along skin edges (probable suture reaction) but no sign of infection. LUE wound is clean and dry  Lab Results: CBC: No results for input(s): WBC, HGB, HCT, PLT in the last 72 hours. BMET:  Recent Labs    12/09/20 0958  NA 136  K 4.1  CL 102  CO2 28  GLUCOSE 91  BUN 22  CREATININE 1.22*  CALCIUM 8.5*    PT/INR:  Lab Results  Component Value Date   INR 1.3 (H) 12/04/2020   INR 1.0 12/03/2020   ABG:  INR: Will add last result for INR, ABG once components are confirmed Will add last 4 CBG results once components are confirmed  Assessment/Plan:  1. CV - S/p NSTEMI. History of a DVT. SR with HR in the  60-87's. On Lopressor 25 mg bid, Amiodarone 400 mg bid, and Isordil 10 mg tid, baby ec asa, and Apixaban. Will decrease Amiodarone to 200 mg daily as HR mostly in the 60's of late. Of note, she had LA clip at the time of surgery. 2.  Pulmonary - On room air. Encourage incentive spirometer. 3. Volume Overload - On Lasix 40 mg daily as still above pre op weight. Will give Lasix/potassium upon discharge for one week 4.  Expected post op acute blood loss anemia - H and H 06/04 stable at 10.9 and 32.8  5. Mild renal insufficiency-creatinine yesterday decreased 1.22.  6. Discharge   Muhamed Luecke M ZimmermanPA-C 12/11/2020,7:08 AM

## 2020-12-11 NOTE — Progress Notes (Signed)
Checked with pt and reinforced education. Pt has decided to do [facility] in [place] instead of [facility]. Will change referral. Declined walking right now, will at home. Eager for d/c. [id number] Yves Dill CES, ACSM 10:35 AM 12/11/2020

## 2020-12-13 ENCOUNTER — Telehealth (HOSPITAL_COMMUNITY): Payer: Self-pay

## 2020-12-16 ENCOUNTER — Telehealth: Payer: Self-pay | Admitting: *Deleted

## 2020-12-16 NOTE — Telephone Encounter (Signed)
Returned phone call to Ms. Sharlene Motts from weekend answering service report with questions regarding medications. Unsuccessful attempt to reach patient. VM left for return call.

## 2020-12-18 ENCOUNTER — Other Ambulatory Visit: Payer: Self-pay | Admitting: Cardiothoracic Surgery

## 2020-12-18 DIAGNOSIS — Z951 Presence of aortocoronary bypass graft: Secondary | ICD-10-CM

## 2020-12-19 ENCOUNTER — Ambulatory Visit
Admission: RE | Admit: 2020-12-19 | Discharge: 2020-12-19 | Disposition: A | Discharge status: Home / Self Care | Payer: Medicare PPO | Source: Ambulatory Visit | Attending: Cardiothoracic Surgery | Admitting: Cardiothoracic Surgery

## 2020-12-19 ENCOUNTER — Other Ambulatory Visit: Payer: Self-pay

## 2020-12-19 ENCOUNTER — Ambulatory Visit (INDEPENDENT_AMBULATORY_CARE_PROVIDER_SITE_OTHER): Payer: Medicare PPO | Admitting: Cardiothoracic Surgery

## 2020-12-19 VITALS — BP 150/68 | HR 72 | Resp 20 | Ht 66.0 in | Wt 200.0 lb

## 2020-12-19 DIAGNOSIS — J90 Pleural effusion, not elsewhere classified: Secondary | ICD-10-CM | POA: Diagnosis not present

## 2020-12-19 DIAGNOSIS — Z951 Presence of aortocoronary bypass graft: Secondary | ICD-10-CM

## 2020-12-19 MED ORDER — HYDROCHLOROTHIAZIDE 12.5 MG PO CAPS: 25.0000 mg | ORAL_CAPSULE | Freq: Every day | ORAL | 11 refills | Status: DC

## 2020-12-24 DIAGNOSIS — Z951 Presence of aortocoronary bypass graft: Secondary | ICD-10-CM | POA: Diagnosis not present

## 2020-12-24 DIAGNOSIS — Z09 Encounter for follow-up examination after completed treatment for conditions other than malignant neoplasm: Secondary | ICD-10-CM | POA: Diagnosis not present

## 2020-12-24 DIAGNOSIS — I10 Essential (primary) hypertension: Secondary | ICD-10-CM | POA: Diagnosis not present

## 2020-12-24 DIAGNOSIS — D6869 Other thrombophilia: Secondary | ICD-10-CM | POA: Diagnosis not present

## 2020-12-26 ENCOUNTER — Other Ambulatory Visit: Payer: Self-pay | Admitting: Physician Assistant

## 2020-12-26 NOTE — Telephone Encounter (Signed)
Prescription wrote for 30 tablets, however patient takes medication BID will need 60 tablets for full 30 day Rx.

## 2020-12-27 ENCOUNTER — Ambulatory Visit: Payer: Medicare PPO | Admitting: Family

## 2020-12-27 ENCOUNTER — Telehealth: Payer: Self-pay | Admitting: Cardiology

## 2020-12-27 NOTE — Telephone Encounter (Signed)
Pt c/o BP issue: STAT if pt c/o blurred vision, one-sided weakness or slurred speech  1. What are your last 5 BP readings? 117/64; 150/73; 134/59; 112/69; 123/91  2. Are you having any other symptoms (ex. Dizziness, headache, blurred vision, passed out)? Headache  3. What is your BP issue? Low  Patient want to get back on the bp meds she was on before her surgery

## 2020-12-27 NOTE — Progress Notes (Signed)
[address]       [place], [address]             [phone]     CARDIOTHORACIC SURGERY OFFICE NOTE  Referring Provider is Branch, Alphonse Guild, MD Primary Cardiologist is Candee Furbish, MD PCP is Kristen Loader, FNP   HPI:  80 year old lady presents for initial postoperative visit in the outpatient setting status post CABG on 12/04/2020.  She did very well after surgery and was discharged a few days later.  She h denies angina or shortness of breath at this point.  She is increasing her activity level steadily.   Current Outpatient Medications  Medication Sig Dispense Refill   acetaminophen (TYLENOL) 500 MG tablet Take 1,000 mg by mouth every 6 (six) hours as needed for mild pain (pain).     albuterol (PROVENTIL HFA;VENTOLIN HFA) 108 (90 BASE) MCG/ACT inhaler Inhale 2 puffs into the lungs every 4 (four) hours as needed for wheezing or shortness of breath. Reported on 10/30/2015     amiodarone (PACERONE) 200 MG tablet Take 1 tablet (200 mg total) by mouth daily. 30 tablet 0   apixaban (ELIQUIS) 5 MG TABS tablet Take 5 mg by mouth 2 (two) times daily.     aspirin EC 81 MG EC tablet Take 1 tablet (81 mg total) by mouth daily. Swallow whole. 30 tablet 11   cetirizine (ZYRTEC) 10 MG tablet Take 10 mg by mouth daily.     diphenhydrAMINE (BENADRYL) 25 mg capsule Take 25-50 mg by mouth every 6 (six) hours as needed for itching. Reported on 10/30/2015     doxycycline (VIBRAMYCIN) 50 MG capsule Take 50 mg by mouth daily.     fluticasone (FLONASE) 50 MCG/ACT nasal spray Place 2 sprays into both nostrils as needed for allergies or rhinitis.     furosemide (LASIX) 40 MG tablet Take 1 tablet (40 mg total) by mouth daily. 7 tablet 0   hydrochlorothiazide (MICROZIDE) 12.5 MG capsule Take 2 capsules (25 mg total) by mouth daily. 30 capsule 11   potassium chloride SA (KLOR-CON) 20 MEQ tablet Take 1 tablet (20 mEq total) by mouth daily. 7 tablet 0   rosuvastatin (CRESTOR) 5 MG tablet  Take 1 tablet (5 mg total) by mouth daily. 30 tablet 3   topiramate (TOPAMAX) 50 MG tablet Take 50 mg by mouth daily.     traMADol (ULTRAM) 50 MG tablet Take 1 tablet (50 mg total) by mouth every 6 (six) hours as needed for severe pain (pain). Reported on 11/13/2015 28 tablet 0   Ubrogepant (UBRELVY) 50 MG TABS Take 1 tablet by mouth daily as needed (For migraine).     metoprolol tartrate (LOPRESSOR) 25 MG tablet TAKE ONE (1) TABLET BY MOUTH TWO (2) TIMES DAILY 30 tablet 1   No current facility-administered medications for this visit.      Physical Exam:   BP (!) 150/68   Pulse 72   Resp 20   Ht 5\' 6"  (1.676 m)   Wt 90.7 kg   SpO2 99% Comment: RA  BMI 32.28 kg/m   General:  Well-appearing no acute distress  Chest:   Clear to auscultation bilaterally  CV:   Regular rate and rhythm  Incisions:  Healing well  Abdomen:  Soft nontender  Extremities:  No edema, well-perfused  Diagnostic Tests:  I personally reviewed her available imaging studies including chest x-ray from 12/19/2020 which demonstrates clear lung fields, stable mediastinal, and a very small  left pleural effusion   Impression:  Doing well after CABG  Plan:  Follow-up with thoracic surgery as needed Okay to try and 1 to 2 weeks  I spent in excess of 10 minutes during the conduct of this office consultation and >50% of this time involved direct face-to-face encounter with the patient for counseling and/or coordination of their care.  Level 2                 10 minutes Level 3                 15 minutes Level 4                 25 minutes Level 5                 40 minutes  B.  Murvin Natal, MD 12/27/2020 3:08 PM

## 2020-12-27 NOTE — Telephone Encounter (Signed)
Called and spoke with pt re: her concerns r/t her BP medications.  She reports her BP is "all over the place" and that it can't be good for her.  Prior to her CABG she was taking Atenolol 25 mg daily, HCTZ 25 mg daily and Olmesartan 40 mg daily.  Since d/c she has been taking Metoprolol Tartrate 25 mg BID, HCTZ 25 mg daily and Amiodarone 200 mg a day.  She reports she seeing her PCP Tuesday who restarted the Olmesartan at 20 mg a day.  Advised pt to continue these medications as listed.  It may take some time for the Olmesartan to get into her system and bring her BP down to a more consistent level.  She is scheduled for f/u in our office 01/10/21.  She will c/b prior to then if any further questions/concerns.  She was grateful for the call back and information.

## 2021-01-07 ENCOUNTER — Other Ambulatory Visit: Payer: Self-pay | Admitting: Physician Assistant

## 2021-01-09 ENCOUNTER — Other Ambulatory Visit: Payer: Self-pay

## 2021-01-09 MED ORDER — AMIODARONE HCL 200 MG PO TABS: 200.0000 mg | ORAL_TABLET | Freq: Every day | ORAL | 0 refills | Status: DC

## 2021-01-09 NOTE — Progress Notes (Signed)
Reordered Amiodarone per Dr. Orvan Seen. Patient is to see Cardiology and will take over medications once seen.

## 2021-01-10 ENCOUNTER — Other Ambulatory Visit: Payer: Self-pay

## 2021-01-10 ENCOUNTER — Ambulatory Visit (HOSPITAL_BASED_OUTPATIENT_CLINIC_OR_DEPARTMENT_OTHER): Payer: Medicare PPO | Admitting: Family

## 2021-01-10 ENCOUNTER — Encounter (HOSPITAL_BASED_OUTPATIENT_CLINIC_OR_DEPARTMENT_OTHER): Payer: Self-pay | Admitting: Family

## 2021-01-10 VITALS — BP 122/58 | HR 52 | Ht 66.0 in | Wt 195.6 lb

## 2021-01-10 DIAGNOSIS — Z79899 Other long term (current) drug therapy: Secondary | ICD-10-CM | POA: Diagnosis not present

## 2021-01-10 DIAGNOSIS — I25118 Atherosclerotic heart disease of native coronary artery with other forms of angina pectoris: Secondary | ICD-10-CM | POA: Diagnosis not present

## 2021-01-10 DIAGNOSIS — I10 Essential (primary) hypertension: Secondary | ICD-10-CM

## 2021-01-10 DIAGNOSIS — I480 Paroxysmal atrial fibrillation: Secondary | ICD-10-CM | POA: Diagnosis not present

## 2021-01-10 DIAGNOSIS — Z951 Presence of aortocoronary bypass graft: Secondary | ICD-10-CM | POA: Diagnosis not present

## 2021-01-10 MED ORDER — HYDROCHLOROTHIAZIDE 12.5 MG PO CAPS: 12.5000 mg | ORAL_CAPSULE | Freq: Every day | ORAL | 5 refills | Status: DC

## 2021-01-10 MED ORDER — AMIODARONE HCL 200 MG PO TABS: 200.0000 mg | ORAL_TABLET | Freq: Every day | ORAL | 5 refills | Status: DC

## 2021-01-10 MED ORDER — METOPROLOL TARTRATE 25 MG PO TABS: 25.0000 mg | ORAL_TABLET | Freq: Two times a day (BID) | ORAL | 5 refills | Status: DC

## 2021-01-10 MED ORDER — OLMESARTAN MEDOXOMIL 20 MG PO TABS: 20.0000 mg | ORAL_TABLET | Freq: Every day | ORAL | 5 refills | Status: DC

## 2021-01-10 NOTE — Progress Notes (Signed)
Office Visit    Patient Name: Krystal Mcpherson Date of Encounter: 01/11/2021  PCP:  Kristen Loader, [facility]  Cardiologist:  Candee Furbish, MD  Advanced Practice Provider:  No care team member to display Electrophysiologist:  None   Chief Complaint    Krystal Mcpherson is a 80 y.o. female with a hx of CAD s/p PCI to RCA and subsequent CABG, hypertension, hyperlipidemia, DVT, bradycardia, postoperative atrial fibrillation, SVT presents today for hospital follow up.   Past Medical History    Past Medical History:  Diagnosis Date   Allergy    Anemia    Hx   Anginal pain (HCC)    Arthritis    neck, lower back   Asthma    Change in bowel habits    Chronic kidney disease    Complication of anesthesia    Coronary artery disease    Dyspnea    GERD (gastroesophageal reflux disease)    HLD (hyperlipidemia)    hx - no med   HTN (hypertension)    Migraine    Myocardial infarction (HCC)    Pneumonia    PONV (postoperative nausea and vomiting)    Stented coronary artery    right, no problems   SVD (spontaneous vaginal delivery)    x 2   Vitamin B12 deficiency    Past Surgical History:  Procedure Laterality Date   bladder tact     chiara malformation  2001   surgery for decompression   CHOLECYSTECTOMY     CLIPPING OF ATRIAL APPENDAGE  12/04/2020   Procedure: CLIPPING OF LEFT ATRIAL APPENDAGE;  Surgeon: Wonda Olds, MD;  Location: [place];  Service: Open Heart Surgery;;   COLONOSCOPY  2009    stark   cornary stent Right 05/2011   CORONARY ARTERY BYPASS GRAFT N/A 12/04/2020   Procedure: CORONARY ARTERY BYPASS GRAFTING (CABG), ON PUMP, TIMES THREE, USING LEFT INTERNAL MAMMARY ARTERY AND LEFT RADIAL ARTERY;  Surgeon: Wonda Olds, MD;  Location: [facility] [facility];  Service: Open Heart Surgery;  Laterality: N/A;   LEFT HEART CATH AND CORONARY ANGIOGRAPHY N/A 11/28/2020   Procedure: LEFT HEART CATH AND CORONARY ANGIOGRAPHY;  Surgeon: Martinique, Peter M, MD;   Location: [facility];  Service: Cardiovascular;  Laterality: N/A;   NASAL SINUS SURGERY     x 2   RADIAL ARTERY HARVEST Left 12/04/2020   Procedure: Left RADIAL ARTERY HARVEST;  Surgeon: Wonda Olds, MD;  Location: [facility];  Service: Open Heart Surgery;  Laterality: Left;   TEE WITHOUT CARDIOVERSION N/A 12/04/2020   Procedure: TRANSESOPHAGEAL ECHOCARDIOGRAM (TEE);  Surgeon: Wonda Olds, MD;  Location: [facility];  Service: Open Heart Surgery;  Laterality: N/A;   TOTAL ABDOMINAL HYSTERECTOMY W/ BILATERAL SALPINGOOPHORECTOMY      Allergies  Allergies  Allergen Reactions   Metronidazole Hives   Atorvastatin     Muscle pain    Ciprofloxacin Hives   Fentanyl Hives    Itching all over    Morphine And Related     Hallucinations    Tetanus Toxoids     Localized knot    Augmentin [Amoxicillin-Pot Clavulanate] Nausea And Vomiting    Pt has taken Keflex (cephalexin) without adverse reaction. Started 08/06/16    History of Present Illness    Krystal Mcpherson is a 80 y.o. female with a hx of CAD s/p PCI to RCA and subsequent CABG, hypertension, hyperlipidemia, DVT, bradycardia, postoperative atrial fibrillation,  SVT  last seen while hospitalized.  She had previous PTCI with stenting to the right coronary artery in 2012.  She has been followed routinely by Dr. Marlou Porch.  She was diagnosed 10/2020 with deep vein thrombosis of the right popliteal and posterior tibial veins.  She was started on Eliquis at that time.  On 524/22 she began having left arm pain and numbness and when contacted our office was advised to proceed to the emergency department.  She was evaluated in the emergency department 11/28/2020 with initial EKG showing diffuse ST depressions, sinus bradycardia, frequent PVC.  HS troponin 1622 ? 3200.  She was diagnosed with NSTEMI and transferred to [facility].  Underwent cardiac catheterization 11/28/2020 showing patent RCA stent with 30% stenosis, mid RCA 50% stenosis, proximal LAD  90% stenosis in the ostium of the diagonal and 80% stenosis, 90% stenosis in first obtuse marginal coronary artery and mid circumflex totally occluded with right to left collaterals.  Echocardiogram with LVEF 55 to 60%, no significant valvular abnormalities.  Carotid duplex showed no evidence of carotid disease bilaterally.  She did require Plavix washout and underwent CABG times 36/1/22 with Dr. Orvan Seen.  She was started on isosorbide for left radial artery harvest.  She did have atrial fibrillation while ambulating with heart rate getting up to the low 100s and her metoprolol was increased.  She presents today for follow up.  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 She has been trying to walk in the morning and the afternoon.  When evaluated recently by her primary care provider her olmesartan 20 mg daily was resumed.  She notes some neuropathic type pain near her midsternal incision though no chest tightness nor pressure. Blood pressure at home labile 85/44 - 157/78. Reports no shortness of breath at rest and that dyspnea on exertion is improving. No edema, orthopnea, PND. Reports no palpitations nor elevated heart rates. She is very concerned with how long it is going to take her to recover from this surgery and we discussed that it was a major surgery and we would expect a gradual improvement.  EKGs/Labs/Other Studies Reviewed:   The following studies were reviewed today:    Echo 11/28/2020: 1. Left ventricular ejection fraction, by estimation, is 55 to 60%. The  left ventricle has normal function. The left ventricle demonstrates  regional wall motion abnormalities (see scoring diagram/findings for  description). Left ventricular diastolic  parameters are consistent with Grade I diastolic dysfunction (impaired  relaxation).   2. Right ventricular systolic function is normal. The right ventricular  size is normal. There is normal pulmonary artery  systolic pressure. The  estimated right ventricular systolic pressure is 71.0 mmHg.   3. The mitral valve is grossly normal. Trivial mitral valve  regurgitation. No evidence of mitral stenosis.   4. The aortic valve is tricuspid. Aortic valve regurgitation is not  visualized. No aortic stenosis is present.   5. The inferior vena cava is normal in size with greater than 50%  respiratory variability, suggesting right atrial pressure of 3 mmHg.   Left heart cath 11/28/2020: Prox LAD to Mid LAD lesion is 90% stenosed. 1st Diag lesion is 80% stenosed. 1st Mrg lesion is 90% stenosed. Mid Cx lesion is 100% stenosed. Prox RCA lesion is 30% stenosed. Prox RCA to Mid RCA lesion is 50% stenosed. LV end diastolic pressure is mildly elevated.   1. 3 vessel obstructive CAD    - 90% proximal to mid  LAD, 80% first diagonal    - 90% large OM1, 100% LCX post OM1 with right to left collaterals.    - Patent stent in the proximal RCA. 50% mid vessel 2. Mildly elevated LVEDP  EKG:  EKG is ordered today.  The ekg ordered today demonstrates sinus bradycardia 52 bpm with no acute St/T wave changes.   Recent Labs: 12/01/2020: ALT 18 12/02/2020: TSH 2.743 12/05/2020: Magnesium 2.9 12/07/2020: Hemoglobin 10.9; Platelets 170 12/09/2020: BUN 22; Creatinine, Ser 1.22; Potassium 4.1; Sodium 136  Recent Lipid Panel    Component Value Date/Time   CHOL 154 11/29/2020 0609   TRIG 54 11/29/2020 0609   HDL 49 11/29/2020 0609   CHOLHDL 3.1 11/29/2020 0609   VLDL 11 11/29/2020 0609   LDLCALC 94 11/29/2020 0609    Risk Assessment/Calculations:   CHA2DS2-VASc Score = 5  This indicates a 7.2% annual risk of stroke. The patient's score is based upon: CHF History: No HTN History: Yes Diabetes History: No Stroke History: No Vascular Disease History: Yes Age Score: 2 Gender Score: 1   Home Medications   Current Meds  Medication Sig   acetaminophen (TYLENOL) 500 MG tablet Take 1,000 mg by mouth every 6 (six)  hours as needed for mild pain (pain).   albuterol (PROVENTIL HFA;VENTOLIN HFA) 108 (90 BASE) MCG/ACT inhaler Inhale 2 puffs into the lungs every 4 (four) hours as needed for wheezing or shortness of breath. Reported on 10/30/2015   apixaban (ELIQUIS) 5 MG TABS tablet Take 5 mg by mouth 2 (two) times daily.   aspirin EC 81 MG EC tablet Take 1 tablet (81 mg total) by mouth daily. Swallow whole.   cetirizine (ZYRTEC) 10 MG tablet Take 10 mg by mouth daily.   diphenhydrAMINE (BENADRYL) 25 mg capsule Take 25-50 mg by mouth every 6 (six) hours as needed for itching. Reported on 10/30/2015   fluticasone (FLONASE) 50 MCG/ACT nasal spray Place 2 sprays into both nostrils as needed for allergies or rhinitis.   hydrochlorothiazide (MICROZIDE) 12.5 MG capsule Take 1 capsule (12.5 mg total) by mouth daily.   rosuvastatin (CRESTOR) 5 MG tablet Take 1 tablet (5 mg total) by mouth daily.   topiramate (TOPAMAX) 50 MG tablet Take 50 mg by mouth daily.   traMADol (ULTRAM) 50 MG tablet Take 1 tablet (50 mg total) by mouth every 6 (six) hours as needed for severe pain (pain). Reported on 11/13/2015   [DISCONTINUED] amiodarone (PACERONE) 200 MG tablet Take 1 tablet (200 mg total) by mouth daily.   [DISCONTINUED] hydrochlorothiazide (MICROZIDE) 12.5 MG capsule Take 2 capsules (25 mg total) by mouth daily.   [DISCONTINUED] metoprolol tartrate (LOPRESSOR) 25 MG tablet TAKE ONE (1) TABLET BY MOUTH TWO (2) TIMES DAILY   [DISCONTINUED] olmesartan (BENICAR) 20 MG tablet Take 20 mg by mouth daily.     Review of Systems      All other systems reviewed and are otherwise negative except as noted above.  Physical Exam    VS:  BP (!) 122/58   Pulse (!) 52   Ht 5\' 6"  (1.676 m)   Wt 195 lb 9.6 oz (88.7 kg)   BMI 31.57 kg/m  , BMI Body mass index is 31.57 kg/m.  Wt Readings from Last 3 Encounters:  01/10/21 195 lb 9.6 oz (88.7 kg)  12/19/20 200 lb (90.7 kg)  12/11/20 203 lb 4.8 oz (92.2 kg)     GEN: Well nourished, well  developed, in no acute distress. HEENT: normal. Neck: Supple, no JVD, carotid  bruits, or masses. Cardiac: RRR, no murmurs, rubs, or gallops. No clubbing, cyanosis, edema.  Radials/PT 2+ and equal bilaterally.  Respiratory:  Respirations regular and unlabored, clear to auscultation bilaterally. GI: Soft, nontender, nondistended. MS: No deformity or atrophy. Skin: Warm and dry, no rash.  Midsternal incision clean, dry, intact with no signs of infection. Neuro:  Strength and sensation are intact. Psych: Normal affect.  Assessment & Plan    CAD s/p CABG -reports some neuropathic type pain at incisional sites and reassurance provided that this will improve.  Incisions are healing appropriately without signs of infection.  Notes her dyspnea on exertion is improving and she is trying to walk daily with her dog.  Encouraged to participate in cardiac rehab, referral placed during hospitalization.  EKG today with no acute ST/T wave changes.  GDMT includes aspirin, metoprolol, rosuvastatin.  She is overall somewhat frustrated she feels she has not improved this quickly as she would like and reassurance provided that it does take time. Heart healthy diet and regular cardiovascular exercise encouraged.    HLD, LDL goal <70 -11/29/2020 LDL 94.  Continue rosuvastatin 5 mg daily.  Plan for repeat fasting lipid panel next week. If LDL not at goal consider increased dose versus addition of Zetia.  DVT - DVT 10/2020 though multiple superficial DVTs in the past. Postoperative atrial fibrillation in setting of CABG with CHA2DS2-VASc Score = 5 [CHF History: No, HTN History: Yes, Diabetes History: No, Stroke History: No, Vascular Disease History: Yes, Age Score: 2, Gender Score: 1].  Therefore, the patient's annual risk of stroke is 7.2 %.   Will discuss with primary cardiologist though anticipate she may need lifelong anticoagulation. We will at least continue until Amiodarone is weaned given elevated CHADS2VASc score.    Postoperative atrial fibrillation /on amiodarone therapy -plan for TSH, CMP next week for monitoring.  No signs of amiodarone toxicity.  She did have left atrial clip procedure during her CABG. discussed the importance of antiarrhythmic therapy for a few months after surgery.  Readdress at follow-up and we may be able to wean her off Amiodarone.  Denies palpitations.  Continue metoprolol tartrate 25 mg twice daily, refill provided.  We discussed the metoprolol tartrate has better data for continued cardiovascular protection than atenolol and she was agreeable to continue.  Anticoagulation - Denies bleeding complications. CBC next week for monitoring.   HTN -blood pressure at home has been relatively labile with some hypotensive readings.  Reduce hydrochlorothiazide to 12.5 mg daily.  Continue olmesartan 20 mg daily.  Continue metoprolol tartrate 25 mg twice daily.  Recommend checking blood pressure once per day after medications.  Disposition: Follow up in 2 month(s) with Dr. Marlou Porch or APP.  Signed, Loel Dubonnet, NP 01/11/2021, 10:03 AM [place]

## 2021-01-10 NOTE — Patient Instructions (Signed)
Medication Instructions:  Your physician has recommended you make the following change in your medication:   CHANGE Hydrochlorothiaze to one 12.5mg  tablet daily  *If you need a refill on your cardiac medications before your next appointment, please call your pharmacy*   Lab Work: Your physician recommends  lab work next week: CBC, CMP, TSH, fasting lipid panel   If you have labs (blood work) drawn today and your tests are completely normal, you will receive your results only by: [place] (if you have MyChart) OR A paper copy in the mail If you have any lab test that is abnormal or we need to change your treatment, we will call you to review the results.   Testing/Procedures: Your EKG today showed sinus bradycardia which is a slow but regular heart rhythm.    Follow-Up: At [facility], you and your health needs are our priority.  As part of our continuing mission to provide you with exceptional heart care, we have created designated Provider Care Teams.  These Care Teams include your primary Cardiologist (physician) and Advanced Practice Providers (APPs -  Physician Assistants and Nurse Practitioners) who all work together to provide you with the care you need, when you need it.  We recommend signing up for the patient portal called "MyChart".  Sign up information is provided on this After Visit Summary.  MyChart is used to connect with patients for Virtual Visits (Telemedicine).  Patients are able to view lab/test results, encounter notes, upcoming appointments, etc.  Non-urgent messages can be sent to your provider as well.   To learn more about what you can do with MyChart, go to [email or web address].    Your next appointment:   2 month(s)  The format for your next appointment:   In Person  Provider:   Candee Furbish, MD   Other Instructions  Heart Healthy Diet Recommendations: A low-salt diet is recommended. Meats should be grilled, baked, or boiled. Avoid fried  foods. Focus on lean protein sources like fish or chicken with vegetables and fruits. The American Heart Association is a Microbiologist!  American Heart Association Diet and Lifeystyle Recommendations    Exercise recommendations: The American Heart Association recommends 150 minutes of moderate intensity exercise weekly. Try 30 minutes of moderate intensity exercise 4-5 times per week. This could include walking, jogging, or swimming.  You have been referred to cardiac rehab. This is a combination program including monitored exercise, dietary education, and support group. We strongly recommend participating in the program. Expect a phone call from them in approximately 2-3 weeks.

## 2021-01-11 ENCOUNTER — Encounter (HOSPITAL_BASED_OUTPATIENT_CLINIC_OR_DEPARTMENT_OTHER): Payer: Self-pay | Admitting: Family

## 2021-01-11 MED ORDER — ROSUVASTATIN CALCIUM 5 MG PO TABS: 5.0000 mg | ORAL_TABLET | Freq: Every day | ORAL | 3 refills | Status: DC

## 2021-01-16 ENCOUNTER — Ambulatory Visit
Admission: RE | Admit: 2021-01-16 | Discharge: 2021-01-16 | Disposition: A | Discharge status: Home / Self Care | Payer: Medicare PPO | Source: Ambulatory Visit | Attending: Cardiothoracic Surgery | Admitting: Cardiothoracic Surgery

## 2021-01-16 ENCOUNTER — Other Ambulatory Visit: Payer: Self-pay

## 2021-01-16 ENCOUNTER — Ambulatory Visit (INDEPENDENT_AMBULATORY_CARE_PROVIDER_SITE_OTHER): Payer: Self-pay | Admitting: Cardiothoracic Surgery

## 2021-01-16 VITALS — BP 112/64 | HR 60 | Temp 97.7°F | Resp 20 | Ht 66.0 in | Wt 195.0 lb

## 2021-01-16 DIAGNOSIS — Z951 Presence of aortocoronary bypass graft: Secondary | ICD-10-CM

## 2021-01-16 DIAGNOSIS — R079 Chest pain, unspecified: Secondary | ICD-10-CM | POA: Diagnosis not present

## 2021-01-16 NOTE — Progress Notes (Signed)
Chief complaint: Pain after surgery  History of present illness: 80 year old lady underwent uncomplicated CABG on 09/11/7562.  She was anticoagulated prior to surgery with apixaban due to right lower extremity DVT.  After surgery she was restarted on apixaban.  Around this time her epicardial pacing wires were cut to the level of the skin.  She now returns with pain to the areas where there is what had previously exited.  She describes especially on the left bruising and occasional protrusion of a foreign body that is sensitive and aggravating.  She denies angina or other cardiovascular symptoms including dysrhythmias  Active Ambulatory Problems    Diagnosis Date Noted   Atherosclerosis of native coronary artery of native heart without angina pectoris 03/02/2014   Essential hypertension, benign 03/02/2014   Bradycardia 03/02/2014   Angina decubitus ([place]) 08/01/2014   Obesity 03/19/2015   Change in bowel habits    Cellulitis 08/09/2016   Dog bite, hand, unspecified laterality, subsequent encounter 08/09/2016   NSTEMI (non-ST elevated myocardial infarction) ([place]) 11/28/2020   S/P CABG x 3 12/04/2020   Coronary artery disease 12/04/2020   Resolved Ambulatory Problems    Diagnosis Date Noted   No Resolved Ambulatory Problems   Past Medical History:  Diagnosis Date   Allergy    Anemia    Anginal pain (HCC)    Arthritis    Asthma    Chronic kidney disease    Complication of anesthesia    Dyspnea    GERD (gastroesophageal reflux disease)    HLD (hyperlipidemia)    HTN (hypertension)    Migraine    Myocardial infarction (HCC)    Pneumonia    PONV (postoperative nausea and vomiting)    Stented coronary artery    SVD (spontaneous vaginal delivery)    Vitamin B12 deficiency      Current Outpatient Medications on File Prior to Visit  Medication Sig Dispense Refill   acetaminophen (TYLENOL) 500 MG tablet Take 1,000 mg by mouth every 6 (six) hours as needed for mild pain (pain).      albuterol (PROVENTIL HFA;VENTOLIN HFA) 108 (90 BASE) MCG/ACT inhaler Inhale 2 puffs into the lungs every 4 (four) hours as needed for wheezing or shortness of breath. Reported on 10/30/2015     amiodarone (PACERONE) 200 MG tablet Take 1 tablet (200 mg total) by mouth daily. 30 tablet 5   apixaban (ELIQUIS) 5 MG TABS tablet Take 5 mg by mouth 2 (two) times daily.     aspirin EC 81 MG EC tablet Take 1 tablet (81 mg total) by mouth daily. Swallow whole. 30 tablet 11   cetirizine (ZYRTEC) 10 MG tablet Take 10 mg by mouth daily.     diphenhydrAMINE (BENADRYL) 25 mg capsule Take 25-50 mg by mouth every 6 (six) hours as needed for itching. Reported on 10/30/2015     fluticasone (FLONASE) 50 MCG/ACT nasal spray Place 2 sprays into both nostrils as needed for allergies or rhinitis.     hydrochlorothiazide (MICROZIDE) 12.5 MG capsule Take 1 capsule (12.5 mg total) by mouth daily. 30 capsule 5   metoprolol tartrate (LOPRESSOR) 25 MG tablet Take 1 tablet (25 mg total) by mouth 2 (two) times daily. 60 tablet 5   olmesartan (BENICAR) 20 MG tablet Take 1 tablet (20 mg total) by mouth daily. 30 tablet 5   rosuvastatin (CRESTOR) 5 MG tablet Take 1 tablet (5 mg total) by mouth daily. 90 tablet 3   topiramate (TOPAMAX) 50 MG tablet Take 50 mg by mouth  daily.     traMADol (ULTRAM) 50 MG tablet Take 1 tablet (50 mg total) by mouth every 6 (six) hours as needed for severe pain (pain). Reported on 11/13/2015 28 tablet 0   No current facility-administered medications on file prior to visit.  Physical exam: BP 112/64   Pulse 60   Temp 97.7 F (36.5 C) (Skin)   Resp 20   Ht 5\' 6"  (1.676 m)   Wt 88.5 kg   SpO2 99% Comment: RA  BMI 31.47 kg/m  Well appearing lady no acute distress Sternal incision is well-healed 2 small areas of tenderness beneath the costal margins on each side of the chest.  These are consistent with the site of prior epicardial pacer wire exit  Imaging: Residual epicardial pacing wires can be  seen beneath the skin on the lateral view; chest x-ray is otherwise normal status post CABG  Impression/plan: Superficial termination of epicardial pacing wires.  Since this is causing her discomfort, I think it is reasonable to have her hold Eliquis for a few days and read presented to the office for attempted removal.  I did not think she would require hospital admission or formal operative procedures for this.  She is in agreement and wishes to proceed with this plan.  She will return next Tuesday.  Milady Fleener Z. Orvan Seen, [place]

## 2021-01-17 DIAGNOSIS — I25118 Atherosclerotic heart disease of native coronary artery with other forms of angina pectoris: Secondary | ICD-10-CM | POA: Diagnosis not present

## 2021-01-17 DIAGNOSIS — Z79899 Other long term (current) drug therapy: Secondary | ICD-10-CM | POA: Diagnosis not present

## 2021-01-18 LAB — CBC
Hematocrit: 39.3 % (ref 34.0–46.6)
Hemoglobin: 13 g/dL (ref 11.1–15.9)
MCH: 28.7 pg (ref 26.6–33.0)
MCHC: 33.1 g/dL (ref 31.5–35.7)
MCV: 87 fL (ref 79–97)
Platelets: 216 10*3/uL (ref 150–450)
RBC: 4.53 x10E6/uL (ref 3.77–5.28)
RDW: 14.6 % (ref 11.7–15.4)
WBC: 4.8 10*3/uL (ref 3.4–10.8)

## 2021-01-18 LAB — LIPID PANEL
Chol/HDL Ratio: 2.7 ratio (ref 0.0–4.4)
Cholesterol, Total: 139 mg/dL (ref 100–199)
HDL: 51 mg/dL (ref >39)
LDL Chol Calc (NIH): 71 mg/dL (ref 0–99)
Triglycerides: 92 mg/dL (ref 0–149)
VLDL Cholesterol Cal: 17 mg/dL (ref 5–40)

## 2021-01-18 LAB — COMPREHENSIVE METABOLIC PANEL
ALT: 7 IU/L (ref 0–32)
AST: 13 IU/L (ref 0–40)
Albumin/Globulin Ratio: 2.6 — ABNORMAL HIGH (ref 1.2–2.2)
Albumin: 4.2 g/dL (ref 3.7–4.7)
Alkaline Phosphatase: 45 IU/L (ref 44–121)
BUN/Creatinine Ratio: 14 (ref 12–28)
BUN: 18 mg/dL (ref 8–27)
Bilirubin Total: 0.4 mg/dL (ref 0.0–1.2)
CO2: 25 mmol/L (ref 20–29)
Calcium: 9.2 mg/dL (ref 8.7–10.3)
Chloride: 102 mmol/L (ref 96–106)
Creatinine, Ser: 1.27 mg/dL — ABNORMAL HIGH (ref 0.57–1.00)
Globulin, Total: 1.6 g/dL (ref 1.5–4.5)
Glucose: 94 mg/dL (ref 65–99)
Potassium: 4.1 mmol/L (ref 3.5–5.2)
Sodium: 139 mmol/L (ref 134–144)
Total Protein: 5.8 g/dL — ABNORMAL LOW (ref 6.0–8.5)
eGFR: 44 mL/min/{1.73_m2} — ABNORMAL LOW (ref >59)

## 2021-01-18 LAB — TSH: TSH: 4.06 u[IU]/mL (ref 0.450–4.500)

## 2021-01-21 ENCOUNTER — Ambulatory Visit (INDEPENDENT_AMBULATORY_CARE_PROVIDER_SITE_OTHER): Payer: Self-pay | Admitting: Cardiothoracic Surgery

## 2021-01-21 ENCOUNTER — Other Ambulatory Visit: Payer: Self-pay

## 2021-01-21 VITALS — BP 128/76 | HR 86 | Resp 20 | Ht 66.0 in

## 2021-01-21 DIAGNOSIS — Z951 Presence of aortocoronary bypass graft: Secondary | ICD-10-CM

## 2021-01-21 NOTE — Progress Notes (Signed)
[address]       ,[place] [address]             [phone]     CARDIOTHORACIC SURGERY OFFICE NOTE  Referring Provider is Branch, Alphonse Guild, MD Primary Cardiologist is Candee Furbish, MD PCP is Kristen Loader, FNP   HPI:  She presents for removal of protruding EPW. This has been causing pain, as documented at last visit. Has been holding Eliquis.    Current Outpatient Medications  Medication Sig Dispense Refill   acetaminophen (TYLENOL) 500 MG tablet Take 1,000 mg by mouth every 6 (six) hours as needed for mild pain (pain).     albuterol (PROVENTIL HFA;VENTOLIN HFA) 108 (90 BASE) MCG/ACT inhaler Inhale 2 puffs into the lungs every 4 (four) hours as needed for wheezing or shortness of breath. Reported on 10/30/2015     amiodarone (PACERONE) 200 MG tablet Take 1 tablet (200 mg total) by mouth daily. 30 tablet 5   apixaban (ELIQUIS) 5 MG TABS tablet Take 5 mg by mouth 2 (two) times daily.     aspirin EC 81 MG EC tablet Take 1 tablet (81 mg total) by mouth daily. Swallow whole. 30 tablet 11   cetirizine (ZYRTEC) 10 MG tablet Take 10 mg by mouth daily.     diphenhydrAMINE (BENADRYL) 25 mg capsule Take 25-50 mg by mouth every 6 (six) hours as needed for itching. Reported on 10/30/2015     fluticasone (FLONASE) 50 MCG/ACT nasal spray Place 2 sprays into both nostrils as needed for allergies or rhinitis.     hydrochlorothiazide (MICROZIDE) 12.5 MG capsule Take 1 capsule (12.5 mg total) by mouth daily. 30 capsule 5   metoprolol tartrate (LOPRESSOR) 25 MG tablet Take 1 tablet (25 mg total) by mouth 2 (two) times daily. 60 tablet 5   olmesartan (BENICAR) 20 MG tablet Take 1 tablet (20 mg total) by mouth daily. 30 tablet 5   rosuvastatin (CRESTOR) 5 MG tablet Take 1 tablet (5 mg total) by mouth daily. 90 tablet 3   topiramate (TOPAMAX) 50 MG tablet Take 50 mg by mouth daily.     traMADol (ULTRAM) 50 MG tablet Take 1 tablet (50 mg total) by mouth every 6 (six) hours as  needed for severe pain (pain). Reported on 11/13/2015 28 tablet 0   No current facility-administered medications for this visit.      Physical Exam:   BP 128/76   Pulse 86   Resp 20   Ht 5\' 6"  (1.676 m)   SpO2 96% Comment: RA  BMI 31.47 kg/m   General:  NAD  Chest:   cta  CV:   rrr   Upper abd cleansed with betadine; 2 distinct areas of prior EPW exit anesthetized with 1% lidocaine. Small incisions made over the palpable defect. Left sided wires removed; could not retrieve right-sided wires (atrial wires). Sutures placed at conclusion and sterile dressing.  Diagnostic Tests:  No new tests   Impression:  S/p removal of ventricular pacing wires, attempted removal of atrial pacing wires; tolerated well  Plan:  F/u in one week for suture removal Ok to restart Eliquis tomorrow  I spent in excess of 20 minutes during the conduct of this office consultation and >50% of this time involved direct face-to-face encounter with the patient for counseling and/or coordination of their care.  Level 2                 10 minutes Level  3                 15 minutes Level 4                 25 minutes Level 5                 40 minutes  B. Murvin Natal, MD 01/21/2021 10:37 AM

## 2021-01-28 ENCOUNTER — Encounter (INDEPENDENT_AMBULATORY_CARE_PROVIDER_SITE_OTHER): Payer: Self-pay

## 2021-01-28 ENCOUNTER — Other Ambulatory Visit: Payer: Self-pay

## 2021-01-28 DIAGNOSIS — Z4802 Encounter for removal of sutures: Secondary | ICD-10-CM

## 2021-02-06 ENCOUNTER — Telehealth: Payer: Self-pay | Admitting: Cardiology

## 2021-02-06 ENCOUNTER — Telehealth (HOSPITAL_COMMUNITY): Payer: Self-pay

## 2021-02-06 NOTE — Telephone Encounter (Signed)
I spoke with patient. She reports feeling more weak and tired the last couple of days.  Does not feel dizzy or like she will past out.  Did not check heart rate yesterday.  Today around noon she checked heart rate and it was running 45-47.  Continues in this range.  BP is OK. She uses a BP cuff to check BP and heart rate.  Cuff shows heart rate is regular. Cuff has noted 3 irregular reading since she began using after hospital discharge.  Heart rate was 52 when last seen by Laurann Montana, NP. I advised patient to hold PM dose of lopressor this evening and to hold AM dose of lopressor and amiodarone until she hears from office in the morning. ED precautions reviewed with patient.

## 2021-02-06 NOTE — Telephone Encounter (Signed)
Returned pt phone call in regards to CR, adv pt of backlog of 1-3 months. Will contact ar a later date to get schedule.

## 2021-02-06 NOTE — Telephone Encounter (Signed)
   STAT if HR is under 50 or over 120 (normal HR is 60-100 beats per minute)  What is your heart rate? 47  Do you have a log of your heart rate readings (document readings)?   Do you have any other symptoms? Felling tired all day   BP was normal, but HR 45-47 all day today

## 2021-02-07 ENCOUNTER — Telehealth (HOSPITAL_COMMUNITY): Payer: Self-pay

## 2021-02-07 NOTE — Telephone Encounter (Signed)
Pt is interested in the cardiac rehab program, pt stated that she has to have an outpatient procedure done soon and will call back to schedule after that, she doesn't know when she is having procedure done. Placed pt ppw in medical hold.

## 2021-02-07 NOTE — Telephone Encounter (Signed)
Pt aware of Dr Marlou Porch orders to discontinue her Metoprolol and Amiodarone.  She states understanding.  HR has been around 55 bpm and BP is stable per her report.  She will continue to monitor and c/b if any further questions or concerns.

## 2021-02-13 ENCOUNTER — Ambulatory Visit (HOSPITAL_COMMUNITY)
Admission: RE | Admit: 2021-02-13 | Discharge: 2021-02-13 | Disposition: A | Discharge status: Home / Self Care | Payer: Medicare PPO | Source: Ambulatory Visit | Attending: Family Medicine | Admitting: Family Medicine

## 2021-02-13 ENCOUNTER — Other Ambulatory Visit (HOSPITAL_COMMUNITY): Payer: Self-pay | Admitting: Family Medicine

## 2021-02-13 ENCOUNTER — Other Ambulatory Visit: Payer: Self-pay

## 2021-02-13 DIAGNOSIS — M79669 Pain in unspecified lower leg: Secondary | ICD-10-CM | POA: Insufficient documentation

## 2021-02-13 DIAGNOSIS — I82441 Acute embolism and thrombosis of right tibial vein: Secondary | ICD-10-CM | POA: Diagnosis not present

## 2021-02-13 DIAGNOSIS — I82431 Acute embolism and thrombosis of right popliteal vein: Secondary | ICD-10-CM | POA: Diagnosis not present

## 2021-02-13 DIAGNOSIS — M7989 Other specified soft tissue disorders: Secondary | ICD-10-CM | POA: Diagnosis not present

## 2021-02-21 ENCOUNTER — Other Ambulatory Visit: Payer: Self-pay

## 2021-02-21 ENCOUNTER — Ambulatory Visit (INDEPENDENT_AMBULATORY_CARE_PROVIDER_SITE_OTHER): Payer: Self-pay | Admitting: Thoracic Surgery (Cardiothoracic Vascular Surgery)

## 2021-02-21 VITALS — BP 139/70 | HR 60 | Resp 20 | Ht 66.0 in | Wt 199.0 lb

## 2021-02-21 DIAGNOSIS — R079 Chest pain, unspecified: Secondary | ICD-10-CM

## 2021-02-21 DIAGNOSIS — Z951 Presence of aortocoronary bypass graft: Secondary | ICD-10-CM

## 2021-02-21 NOTE — Progress Notes (Signed)
      [address]       [place] [address]             [phone]        Tanijah H Nardozzi [place] Medical Record [id number] Date of Birth: 1941/05/20  Referring: Arnoldo Lenis, MD Primary Care: Kristen Loader, FNP Primary Cardiologist:Mark Marlou Porch, MD  Reason for visit:   follow-up  History of Present Illness:     80 year old female status post CABG with Dr. Orvan Seen presents to determine ability to remove right-sided epicardial pacing wires apparently which is cut at the skin.  She does complain of some sharp pain in certain positions but this is slowly improving.  She states that she would like to have these removed  Physical Exam: BP 139/70   Pulse 60   Resp 20   Ht '5\' 6"'$  (1.676 m)   Wt 199 lb (90.3 kg)   SpO2 100% Comment: RA  BMI 32.12 kg/m   Alert NAD Incision clean.  Sternum stable Abdomen soft, ND No peripheral edema       Assessment / Plan:   S/p CABG with Dr. Orvan Seen.  Retain epicardial wires.  Pain improving.  I am unable to feel the epicardial wires, and they are not evident on chest x-ray.  We discussed several options which included ultrasound exploration versus fluoroscopic exploration.  I think that since her symptoms are getting better it is probably the best option to wait given the fact that she would likely require a big incision to remove these wires potentially.  Will follow for 1 more month Will follow-up in [facility] clinic.   Lajuana Matte 02/21/2021 5:35 PM

## 2021-03-13 ENCOUNTER — Other Ambulatory Visit: Payer: Self-pay

## 2021-03-13 ENCOUNTER — Ambulatory Visit: Payer: Medicare PPO | Admitting: Physician Assistant

## 2021-03-13 VITALS — BP 164/74 | HR 65 | Resp 20 | Wt 200.0 lb

## 2021-03-13 DIAGNOSIS — R079 Chest pain, unspecified: Secondary | ICD-10-CM | POA: Diagnosis not present

## 2021-03-13 DIAGNOSIS — Z951 Presence of aortocoronary bypass graft: Secondary | ICD-10-CM | POA: Diagnosis not present

## 2021-03-13 NOTE — Progress Notes (Signed)
[address]       [place] [address]             [phone]        Krystal Mcpherson is a 80 y.o. female patient who underwent uncomplicated CABG on [id number].  She was anticoagulated prior to surgery with apixaban due to right lower extremity DVT.  After surgery she was restarted on apixaban.  Around this time her epicardial pacing wires were cut to the level of the skin.  She now returned to the office in July with pain to the areas where there is what had previously exited.  She describes especially on the left bruising and occasional protrusion of a foreign body that is sensitive and aggravating.  She denies angina or other cardiovascular symptoms including dysrhythmias. She held her Eliquis and was seen a week later by Dr. Orvan Seen. He removed the ventricular pacing wires and attempted to removal the epicardial atrial pacing wires. She was then seen by Dr. Kipp Brood last month with continued pain. They discussed several options which included ultrasound exploration versus fluoroscopic exploration.  Since her pain was improved it was decided to wait since removal would be a large incision.   She presents to the office today because she wants the retained EPW removed. It has been causing her much discomfort and she is worried about migration. She feels stabbing pain when she bends down and moves in a certain direction.   No diagnosis found. Past Medical History:  Diagnosis Date   Allergy    Anemia    Hx   Anginal pain (HCC)    Arthritis    neck, lower back   Asthma    Change in bowel habits    Chronic kidney disease    Complication of anesthesia    Coronary artery disease    Dyspnea    GERD (gastroesophageal reflux disease)    HLD (hyperlipidemia)    hx - no med   HTN (hypertension)    Migraine    Myocardial infarction (HCC)    Pneumonia    PONV (postoperative nausea and vomiting)    Stented coronary artery    right, no problems   SVD (spontaneous vaginal  delivery)    x 2   Vitamin B12 deficiency    No past surgical history pertinent negatives on file. Scheduled Meds: Current Outpatient Medications on File Prior to Visit  Medication Sig Dispense Refill   acetaminophen (TYLENOL) 500 MG tablet Take 1,000 mg by mouth every 6 (six) hours as needed for mild pain (pain).     albuterol (PROVENTIL HFA;VENTOLIN HFA) 108 (90 BASE) MCG/ACT inhaler Inhale 2 puffs into the lungs every 4 (four) hours as needed for wheezing or shortness of breath. Reported on 10/30/2015     apixaban (ELIQUIS) 5 MG TABS tablet Take 5 mg by mouth 2 (two) times daily.     aspirin EC 81 MG EC tablet Take 1 tablet (81 mg total) by mouth daily. Swallow whole. 30 tablet 11   cetirizine (ZYRTEC) 10 MG tablet Take 10 mg by mouth daily.     diphenhydrAMINE (BENADRYL) 25 mg capsule Take 25-50 mg by mouth every 6 (six) hours as needed for itching. Reported on 10/30/2015     fluticasone (FLONASE) 50 MCG/ACT nasal spray Place 2 sprays into both nostrils as needed for allergies or rhinitis.     hydrochlorothiazide (MICROZIDE) 12.5 MG capsule Take 1 capsule (12.5 mg total) by mouth daily. [place]  capsule 5   olmesartan (BENICAR) 20 MG tablet Take 1 tablet (20 mg total) by mouth daily. 30 tablet 5   rosuvastatin (CRESTOR) 5 MG tablet Take 1 tablet (5 mg total) by mouth daily. 90 tablet 3   topiramate (TOPAMAX) 50 MG tablet Take 50 mg by mouth daily.     traMADol (ULTRAM) 50 MG tablet Take 1 tablet (50 mg total) by mouth every 6 (six) hours as needed for severe pain (pain). Reported on 11/13/2015 28 tablet 0   No current facility-administered medications on file prior to visit.     Allergies  Allergen Reactions   Metronidazole Hives   Atorvastatin     Muscle pain    Ciprofloxacin Hives   Fentanyl Hives    Itching all over    Morphine And Related     Hallucinations    Tetanus Toxoids     Localized knot    Augmentin [Amoxicillin-Pot Clavulanate] Nausea And Vomiting    Pt has taken  Keflex (cephalexin) without adverse reaction. Started 08/06/16   Active Problems:   * No active hospital problems. *  Vitals:   03/13/21 1517  BP: (!) 164/74  Pulse: 65  Resp: 20  SpO2: 98%     Subjective Objective: Vital signs (most recent): There were no vitals taken for this visit. Assessment & Plan  Retained EPW-Krystal Mcpherson is s/p CABG on 6/1 and has been dealing with a retained EPW since then. She had the one on the left side removed in the office by Dr. Orvan Seen after holding her Eliquis for 5 days. She is on this for her history of DVTs. He was unable to remove the wire on the right side. On palpation today I can feel the wire tip. Since she is on anticoagulation so I was unable to do any exploration at the bedside today and I do feel like she would be more comfortable having this area explored under US-guidance. I did make her aware that there is a possibility that she would have to go back to the OR under general anesthesia to have it removed. She understands and would do anything to have it removed since it is very uncomfortable.  HTN- she has an appointment with Cardiology tomorrow and they will address   Plan: I have asked the front nursing staff to make an appointment with the first available surgeon to have this situation handled. She will also need a CXR when she returns to the office since there is an area protruding from her sternal incision and we need to make sure her wires are all intact. She is to maintain sternal precautions in the mean time with no heavy lifting.     Elgie Collard 03/13/2021

## 2021-03-14 ENCOUNTER — Encounter (HOSPITAL_BASED_OUTPATIENT_CLINIC_OR_DEPARTMENT_OTHER): Payer: Self-pay | Admitting: Family

## 2021-03-14 ENCOUNTER — Ambulatory Visit (HOSPITAL_BASED_OUTPATIENT_CLINIC_OR_DEPARTMENT_OTHER): Payer: Medicare PPO | Admitting: Family

## 2021-03-14 VITALS — BP 167/50 | HR 49 | Ht 66.0 in | Wt 200.8 lb

## 2021-03-14 DIAGNOSIS — I480 Paroxysmal atrial fibrillation: Secondary | ICD-10-CM | POA: Diagnosis not present

## 2021-03-14 DIAGNOSIS — Z7901 Long term (current) use of anticoagulants: Secondary | ICD-10-CM

## 2021-03-14 DIAGNOSIS — Z951 Presence of aortocoronary bypass graft: Secondary | ICD-10-CM | POA: Diagnosis not present

## 2021-03-14 DIAGNOSIS — I25118 Atherosclerotic heart disease of native coronary artery with other forms of angina pectoris: Secondary | ICD-10-CM | POA: Diagnosis not present

## 2021-03-14 DIAGNOSIS — E785 Hyperlipidemia, unspecified: Secondary | ICD-10-CM

## 2021-03-14 DIAGNOSIS — I10 Essential (primary) hypertension: Secondary | ICD-10-CM | POA: Diagnosis not present

## 2021-03-14 MED ORDER — METOPROLOL TARTRATE 25 MG PO TABS: 12.5000 mg | ORAL_TABLET | Freq: Two times a day (BID) | ORAL | 1 refills | Status: DC

## 2021-03-14 MED ORDER — HYDROCHLOROTHIAZIDE 25 MG PO TABS: 25.0000 mg | ORAL_TABLET | Freq: Every day | ORAL | 1 refills | Status: DC

## 2021-03-14 NOTE — Patient Instructions (Addendum)
Medication Instructions:  Your physician has recommended you make the following change in your medication:   CHANGE Hydrochlorothiazide to one '25mg'$  tablet daily  CHANGE Metoprolol tartrate to half tablet (12.'5mg'$ ) twice daily  *If you need a refill on your cardiac medications before your next appointment, please call your pharmacy*  Lab Work: None ordered today  Testing/Procedures: None ordered today  Follow-Up: At [facility], you and your health needs are our priority.  As part of our continuing mission to provide you with exceptional heart care, we have created designated Provider Care Teams.  These Care Teams include your primary Cardiologist (physician) and Advanced Practice Providers (APPs -  Physician Assistants and Nurse Practitioners) who all work together to provide you with the care you need, when you need it.  We recommend signing up for the patient portal called "MyChart".  Sign up information is provided on this After Visit Summary.  MyChart is used to connect with patients for Virtual Visits (Telemedicine).  Patients are able to view lab/test results, encounter notes, upcoming appointments, etc.  Non-urgent messages can be sent to your provider as well.   To learn more about what you can do with MyChart, go to [email or web address].    Your next appointment:   4 month(s)  The format for your next appointment:   In Person  Provider:   You may see Candee Furbish, MD or one of the following Advanced Practice Providers on your designated Care Team:   Cecilie Kicks, NP Loel Dubonnet, NP    Other Instructions  Heart Healthy Diet Recommendations: A low-salt diet is recommended. Meats should be grilled, baked, or boiled. Avoid fried foods. Focus on lean protein sources like fish or chicken with vegetables and fruits. The American Heart Association is a Microbiologist!   Exercise recommendations: The American Heart Association recommends 150 minutes of moderate  intensity exercise weekly. Try 30 minutes of moderate intensity exercise 4-5 times per week. This could include walking, jogging, or swimming.   For coronary artery disease often called "heart disease" we aim for optimal guideline directed medical therapy. We use the "A, B, C"s to help keep [place] on track!  A = Aspirin '81mg'$  daily B = Beta blocker which helps to relax the heart. This is your Metoprolol. C = Cholesterol control. You take Rosuvastatin to help control your cholesterol.   To prevent or reduce lower extremity swelling: Eat a low salt diet. Salt makes the body hold onto extra fluid which causes swelling. Sit with legs elevated. For example, in the recliner or on an [place].  Wear knee-high compression stockings during the daytime. Ones labeled 15-20 mmHg provide good compression.

## 2021-03-14 NOTE — Progress Notes (Signed)
Office Visit    Patient Name: Krystal Mcpherson Date of Encounter: 03/14/2021  PCP:  Kristen Loader, [facility]  Cardiologist:  Candee Furbish, MD  Advanced Practice Provider:  No care team member to display Electrophysiologist:  None   Chief Complaint    Krystal Mcpherson is a 80 y.o. female with a hx of CAD s/p PCI to RCA and subsequent CABG, hypertension, hyperlipidemia, DVT, bradycardia, postoperative atrial fibrillation, SVT presents today for follow up of CAD.  Past Medical History    Past Medical History:  Diagnosis Date   Allergy    Anemia    Hx   Anginal pain (HCC)    Arthritis    neck, lower back   Asthma    Change in bowel habits    Chronic kidney disease    Complication of anesthesia    Coronary artery disease    Dyspnea    GERD (gastroesophageal reflux disease)    HLD (hyperlipidemia)    hx - no med   HTN (hypertension)    Migraine    Myocardial infarction (HCC)    Pneumonia    PONV (postoperative nausea and vomiting)    Stented coronary artery    right, no problems   SVD (spontaneous vaginal delivery)    x 2   Vitamin B12 deficiency    Past Surgical History:  Procedure Laterality Date   bladder tact     chiara malformation  2001   surgery for decompression   CHOLECYSTECTOMY     CLIPPING OF ATRIAL APPENDAGE  12/04/2020   Procedure: CLIPPING OF LEFT ATRIAL APPENDAGE;  Surgeon: Wonda Olds, MD;  Location: [facility];  Service: Open Heart Surgery;;   COLONOSCOPY  2009    stark   cornary stent Right 05/2011   CORONARY ARTERY BYPASS GRAFT N/A 12/04/2020   Procedure: CORONARY ARTERY BYPASS GRAFTING (CABG), ON PUMP, TIMES THREE, USING LEFT INTERNAL MAMMARY ARTERY AND LEFT RADIAL ARTERY;  Surgeon: Wonda Olds, MD;  Location: [facility];  Service: Open Heart Surgery;  Laterality: N/A;   LEFT HEART CATH AND CORONARY ANGIOGRAPHY N/A 11/28/2020   Procedure: LEFT HEART CATH AND CORONARY ANGIOGRAPHY;  Surgeon: Martinique, Peter M, MD;  Location:  [facility];  Service: Cardiovascular;  Laterality: N/A;   NASAL SINUS SURGERY     x 2   RADIAL ARTERY HARVEST Left 12/04/2020   Procedure: Left RADIAL ARTERY HARVEST;  Surgeon: Wonda Olds, MD;  Location: [facility];  Service: Open Heart Surgery;  Laterality: Left;   TEE WITHOUT CARDIOVERSION N/A 12/04/2020   Procedure: TRANSESOPHAGEAL ECHOCARDIOGRAM (TEE);  Surgeon: Wonda Olds, MD;  Location: [facility];  Service: Open Heart Surgery;  Laterality: N/A;   TOTAL ABDOMINAL HYSTERECTOMY W/ BILATERAL SALPINGOOPHORECTOMY      Allergies  Allergies  Allergen Reactions   Metronidazole Hives   Atorvastatin     Muscle pain    Ciprofloxacin Hives   Fentanyl Hives    Itching all over    Morphine And Related     Hallucinations    Tetanus Toxoids     Localized knot    Augmentin [Amoxicillin-Pot Clavulanate] Nausea And Vomiting    Pt has taken Keflex (cephalexin) without adverse reaction. Started 08/06/16    History of Present Illness    Krystal Mcpherson is a 80 y.o. female with a hx of CAD s/p PCI to RCA and subsequent CABG, hypertension, hyperlipidemia, DVT, bradycardia, postoperative atrial fibrillation,  SVT  last seen 01/10/2021  She had previous PTCI with stenting to the right coronary artery in 2012.  She has been followed routinely by Dr. Marlou Porch.  She was diagnosed 10/2020 with deep vein thrombosis of the right popliteal and posterior tibial veins.  She was started on Eliquis at that time.  On 524/22 she began having left arm pain and numbness and when contacted our office was advised to proceed to the emergency department.  She was evaluated in the emergency department 11/28/2020 with initial EKG showing diffuse ST depressions, sinus bradycardia, frequent PVC.  HS troponin 1622 ? 3200.  She was diagnosed with NSTEMI and transferred to [facility].  Underwent cardiac catheterization 11/28/2020 showing patent RCA stent with 30% stenosis, mid RCA 50% stenosis, proximal LAD 90% stenosis in the  ostium of the diagonal and 80% stenosis, 90% stenosis in first obtuse marginal coronary artery and mid circumflex totally occluded with right to left collaterals.  Echocardiogram with LVEF 55 to 60%, no significant valvular abnormalities.  Carotid duplex showed no evidence of carotid disease bilaterally.  She did require Plavix washout and underwent CABG times 36/1/22 with Dr. Orvan Seen.  She was started on isosorbide for left radial artery harvest.  She did have atrial fibrillation while ambulating with heart rate getting up to the low 100s and her metoprolol was increased.  She was seen 01/10/2021.  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  Did note blood pressure at home was labile and her hydrochlorothiazide was reduced to 12.5 mg daily.  She was very worried about how long it was going to take her to recover from surgery and noted neuropathic pain near her midsternal incision.  She was subsequently seen in follow-up by cardiothoracic surgery and had left pacing wire removed.  She was seen yesterday as the right pacing wire is still causing her discomfort and they may have to return to the OR for removal.  She presents today for follow-up.  Denies chest pain, Shikhman tightness.  Reports no shortness of breath at rest and that her dyspnea on exertion is improving.  She continues to exercise and stay very active.  Her blood pressure at home has been routinely in the 120s or 130s though was elevated at clinic visit today and yesterday.  EKGs/Labs/Other Studies Reviewed:   The following studies were reviewed today:    Echo 11/28/2020: 1. Left ventricular ejection fraction, by estimation, is 55 to 60%. The  left ventricle has normal function. The left ventricle demonstrates  regional wall motion abnormalities (see scoring diagram/findings for  description). Left ventricular diastolic  parameters are consistent with Grade I diastolic dysfunction (impaired  relaxation).   2. Right  ventricular systolic function is normal. The right ventricular  size is normal. There is normal pulmonary artery systolic pressure. The  estimated right ventricular systolic pressure is 123XX123 mmHg.   3. The mitral valve is grossly normal. Trivial mitral valve  regurgitation. No evidence of mitral stenosis.   4. The aortic valve is tricuspid. Aortic valve regurgitation is not  visualized. No aortic stenosis is present.   5. The inferior vena cava is normal in size with greater than 50%  respiratory variability, suggesting right atrial pressure of 3 mmHg.   Left heart cath 11/28/2020: Prox LAD to Mid LAD lesion is 90% stenosed. 1st Diag lesion is 80% stenosed. 1st Mrg lesion is 90% stenosed. Mid Cx lesion is 100% stenosed. Prox RCA lesion is 30% stenosed. Prox RCA to Mid RCA lesion  is 50% stenosed. LV end diastolic pressure is mildly elevated.   1. 3 vessel obstructive CAD    - 90% proximal to mid LAD, 80% first diagonal    - 90% large OM1, 100% LCX post OM1 with right to left collaterals.    - Patent stent in the proximal RCA. 50% mid vessel 2. Mildly elevated LVEDP  EKG:  No EKG today.   Recent Labs: 12/05/2020: Magnesium 2.9 01/17/2021: ALT 7; BUN 18; Creatinine, Ser 1.27; Hemoglobin 13.0; Platelets 216; Potassium 4.1; Sodium 139; TSH 4.060  Recent Lipid Panel    Component Value Date/Time   CHOL 139 01/17/2021 1152   TRIG 92 01/17/2021 1152   HDL 51 01/17/2021 1152   CHOLHDL 2.7 01/17/2021 1152   CHOLHDL 3.1 11/29/2020 0609   VLDL 11 11/29/2020 0609   LDLCALC 71 01/17/2021 1152    Risk Assessment/Calculations:   CHA2DS2-VASc Score = 5  This indicates a 7.2% annual risk of stroke. The patient's score is based upon: CHF History: 0 HTN History: 1 Diabetes History: 0 Stroke History: 0 Vascular Disease History: 1 Age Score: 2 Gender Score: 1   Home Medications   Current Meds  Medication Sig   acetaminophen (TYLENOL) 500 MG tablet Take 1,000 mg by mouth every 6 (six)  hours as needed for mild pain (pain).   albuterol (PROVENTIL HFA;VENTOLIN HFA) 108 (90 BASE) MCG/ACT inhaler Inhale 2 puffs into the lungs every 4 (four) hours as needed for wheezing or shortness of breath. Reported on 10/30/2015   apixaban (ELIQUIS) 5 MG TABS tablet Take 5 mg by mouth 2 (two) times daily.   aspirin EC 81 MG EC tablet Take 1 tablet (81 mg total) by mouth daily. Swallow whole.   cetirizine (ZYRTEC) 10 MG tablet Take 10 mg by mouth daily.   diphenhydrAMINE (BENADRYL) 25 mg capsule Take 25-50 mg by mouth every 6 (six) hours as needed for itching. Reported on 10/30/2015   fluticasone (FLONASE) 50 MCG/ACT nasal spray Place 2 sprays into both nostrils as needed for allergies or rhinitis.   hydrochlorothiazide (MICROZIDE) 12.5 MG capsule Take 1 capsule (12.5 mg total) by mouth daily.   metoprolol tartrate (LOPRESSOR) 25 MG tablet Take 25 mg by mouth 2 (two) times daily.   olmesartan (BENICAR) 20 MG tablet Take 1 tablet (20 mg total) by mouth daily.   rosuvastatin (CRESTOR) 5 MG tablet Take 1 tablet (5 mg total) by mouth daily.   topiramate (TOPAMAX) 50 MG tablet Take 50 mg by mouth daily.   traMADol (ULTRAM) 50 MG tablet Take 1 tablet (50 mg total) by mouth every 6 (six) hours as needed for severe pain (pain). Reported on 11/13/2015     Review of Systems      All other systems reviewed and are otherwise negative except as noted above.  Physical Exam    VS:  Ht '5\' 6"'$  (1.676 m)   Wt 200 lb 12.8 oz (91.1 kg)   BMI 32.41 kg/m  , BMI Body mass index is 32.41 kg/m.  Wt Readings from Last 3 Encounters:  03/14/21 200 lb 12.8 oz (91.1 kg)  03/13/21 200 lb (90.7 kg)  02/21/21 199 lb (90.3 kg)     GEN: Well nourished, well developed, in no acute distress. HEENT: normal. Neck: Supple, no JVD, carotid bruits, or masses. Cardiac: RRR, no murmurs, rubs, or gallops. No clubbing, cyanosis, edema.  Radials/PT 2+ and equal bilaterally.  Respiratory:  Respirations regular and unlabored, clear  to auscultation bilaterally. GI: Soft, nontender, nondistended.  MS: No deformity or atrophy. Skin: Warm and dry, no rash.  Midsternal incision clean, dry, intact with no signs of infection. Neuro:  Strength and sensation are intact. Psych: Normal affect.  Assessment & Plan    CAD s/p CABG -GDMT includes aspirin, metoprolol, rosuvastatin.  As she is bradycardic today we will reduce metoprolol to 12.5 mg twice daily.  She is asymptomatic with no lightheadedness, dizziness, near-syncope, syncope.  Stable with no anginal symptoms.  No indication for ischemic evaluation.  Heart healthy diet and regular cardiovascular exercise encouraged.  Needs to participate in cardiac rehab.  With discomfort at site of right pacing wire and is following with cardiothoracic surgery with plans for removal.  HLD, LDL goal <70 -11/29/2020 LDL 94.  01/17/21 LDL 71. Continue rosuvastatin 5 mg daily.  Continued lifestyle changes encouraged.  DVT /on anticoagulation- DVT 10/2020 though multiple superficial DVTs in the past. Postoperative atrial fibrillation in setting of CABG with CHA2DS2-VASc Score = 5 [CHF History: 0, HTN History: 1, Diabetes History: 0, Stroke History: 0, Vascular Disease History: 1, Age Score: 2, Gender Score: 1].  Therefore, the patient's annual risk of stroke is 7.2 %.  After discussion with Dr. Marlou Porch recommendation for lifelong anticoagulation.  Denies bleeding complications.  Postoperative atrial fibrillation /on amiodarone therapy - No palpitations. Amiodarone discontinued 02/06/21 due to bradycardia.  She did have left atrial clip procedure during her CABG, but given history of DVT we will continue anticoagulation.  No evidence of recurrence of atrial fibrillation.  HTN -BP mildly elevated.  Increase hydrochlorothiazide to 25 mg daily and continue home monitoring.  Disposition: Follow up in 4 month(s) with Dr. Marlou Porch or APP.  Signed, Loel Dubonnet, NP 03/14/2021, 10:15 AM [place]

## 2021-03-21 ENCOUNTER — Ambulatory Visit: Payer: Medicare PPO | Admitting: Thoracic Surgery (Cardiothoracic Vascular Surgery)

## 2021-03-21 ENCOUNTER — Other Ambulatory Visit: Payer: Self-pay

## 2021-03-21 VITALS — BP 172/78 | HR 59 | Resp 20 | Ht 66.0 in | Wt 201.0 lb

## 2021-03-21 DIAGNOSIS — Z951 Presence of aortocoronary bypass graft: Secondary | ICD-10-CM | POA: Diagnosis not present

## 2021-03-21 DIAGNOSIS — R079 Chest pain, unspecified: Secondary | ICD-10-CM | POA: Diagnosis not present

## 2021-03-21 NOTE — Progress Notes (Signed)
     [address]       [place] [address]             [phone]       This is a 80 year old female who underwent a coronary artery bypass grafting by Dr. Orvan Seen in June 2020.  She had pacing wires that were cut at the skin to continue to complain of right-sided subcostal pain which she believes is related to her retained wire.  On exam I am unable to palpate any wires.  She does complain of some point tenderness in the subcostal region on the right side.  There is no erythema or bruising.  The patient states that the tinnitus is improving.  We discussed [place] 2 options which included leaving the patient wires in the current position and dealing with the pain, and a cut down with exploration to see if we can identify the wires and remove them.  I explained to the patient that I have never had to do this for any retained pacing wires, and for [place] to do this effectively she would need to be intubated.  She states that she is not quite ready to undergo another intubation for this.  She would like to see if the pain continues to improve.  She will call us in the future if there is any issues.  Shanti Agresti Bary Leriche

## 2021-04-14 ENCOUNTER — Encounter (HOSPITAL_BASED_OUTPATIENT_CLINIC_OR_DEPARTMENT_OTHER): Payer: Self-pay

## 2021-04-28 ENCOUNTER — Telehealth: Payer: Self-pay | Admitting: Cardiology

## 2021-04-28 MED ORDER — OLMESARTAN MEDOXOMIL 40 MG PO TABS
40.0000 mg | ORAL_TABLET | Freq: Every day | ORAL | 3 refills | Status: DC
Start: 2021-04-28 — End: 2022-03-04

## 2021-04-28 NOTE — Telephone Encounter (Signed)
Okay to increase Benicar back to 40 mg daily. Candee Furbish, MD

## 2021-04-28 NOTE — Telephone Encounter (Signed)
Pt is aware and agreeable to increasing Benicar to 40 mg a day.  Rx sent into [facility] as requested.

## 2021-04-28 NOTE — Telephone Encounter (Signed)
Spoke with pt who is concerned about her BP. It has been being elevated since last September.  Today it was 172/83 HR between 55-65 bpm.  She takes Metoprolol Tartrate 25 mg BID, Benicar 20 mg and HCTZ 25 mg daily.  She denies any dietary indiscretions - no prepared meals, does not eat out, does not add NA+ to foods when cooking or at the table. Denies use of stimulants.  She also has been having bilateral edema - worse on the right than left.  Does not resolve over night.  Unable to wear knee high compression stockings. Denies SOB, denies CP.  Feels good.  Just got back from a walk.   Prior to her surgery she was taking Benicar 40 mg a day and she is wondering if she can/should increase back up Benicar 40 mg a day.  Advised I will review with Dr Marlou Porch and call back with further recommendations.

## 2021-04-28 NOTE — Telephone Encounter (Signed)
Pt c/o BP issue: STAT if pt c/o blurred vision, one-sided weakness or slurred speech  1. What are your last 5 BP readings?   10/24: 172/83 10/23: 169/77 10/22: 146/67 10/21: 148/73    2. Are you having any other symptoms (ex. Dizziness, headache, blurred vision, passed out)?  Ankle swelling (mainly right)  3. What is your BP issue?   Patient states her BP has been elevated for the past few days.   Pt c/o swelling: STAT is pt has developed SOB within 24 hours  How much weight have you gained and in what time span?  No noticeable weight gain  If swelling, where is the swelling located?  Ankles (mainly right)  Are you currently taking a fluid pill?  Yes   Are you currently SOB?  No   Do you have a log of your daily weights (if so, list)?  No log available   Have you gained 3 pounds in a day or 5 pounds in a week?  No   Have you traveled recently?  No

## 2021-04-29 DIAGNOSIS — L905 Scar conditions and fibrosis of skin: Secondary | ICD-10-CM | POA: Diagnosis not present

## 2021-04-29 DIAGNOSIS — L820 Inflamed seborrheic keratosis: Secondary | ICD-10-CM | POA: Diagnosis not present

## 2021-04-29 DIAGNOSIS — L538 Other specified erythematous conditions: Secondary | ICD-10-CM | POA: Diagnosis not present

## 2021-04-29 DIAGNOSIS — D225 Melanocytic nevi of trunk: Secondary | ICD-10-CM | POA: Diagnosis not present

## 2021-04-29 DIAGNOSIS — L821 Other seborrheic keratosis: Secondary | ICD-10-CM | POA: Diagnosis not present

## 2021-04-29 DIAGNOSIS — D485 Neoplasm of uncertain behavior of skin: Secondary | ICD-10-CM | POA: Diagnosis not present

## 2021-04-29 DIAGNOSIS — L718 Other rosacea: Secondary | ICD-10-CM | POA: Diagnosis not present

## 2021-04-29 DIAGNOSIS — L448 Other specified papulosquamous disorders: Secondary | ICD-10-CM | POA: Diagnosis not present

## 2021-04-29 DIAGNOSIS — L308 Other specified dermatitis: Secondary | ICD-10-CM | POA: Diagnosis not present

## 2021-05-21 ENCOUNTER — Telehealth (HOSPITAL_BASED_OUTPATIENT_CLINIC_OR_DEPARTMENT_OTHER): Payer: Self-pay | Admitting: Family

## 2021-05-21 NOTE — Telephone Encounter (Signed)
Pt s/p CABG x 3 on 12/04/2020.  Pt reports increasing sternal and rib pain over the last 3 weeks.  She states she can no longer sleep on either side due to discomfort.  She is not sure who to reach out to since she was last seen and released by Dr Abran Duke office per pt on 03/21/2021.  She has a retained pacer wire that she is also concerned about.  Pt states her surgical pain had dissipated.  She denies current CP, SOB or dizziness/fainting.  She is taking medication as prescribed.  Pt was seen by Loel Dubonnet, NP on 03/14/2021.  She is scheduled to see Dr Marlou Porch 07/25/2021. Encouraged pt to contact Dr Abran Duke office again IT:[id number] pain.  Will also forward to Dr Marlou Porch for review and recommendation.  Reviewed ED precautions.  Pt verbalizes understanding and agrees with current plan.

## 2021-05-21 NOTE — Telephone Encounter (Signed)
New Message:     Patient said she had By Pass surgery in June of this year. She said she is hurting in her Sternum area, no chest pains. She says it, feels like something  have happen inside ,since the surgery. She says it been going on for a while, but the soreness is getting worse.

## 2021-05-22 NOTE — Telephone Encounter (Signed)
Attempted phone call with pt and left voicemail message (OK per Epic) per Dr Marlou Porch pt will need to follow up with Dr Abran Duke office to ensure no changes.  Pt advised if she needs assistance with scheduling appointment to contact our office at [phone].

## 2021-05-29 ENCOUNTER — Emergency Department (HOSPITAL_COMMUNITY): Payer: Medicare PPO

## 2021-05-29 ENCOUNTER — Other Ambulatory Visit: Payer: Self-pay

## 2021-05-29 ENCOUNTER — Observation Stay (HOSPITAL_COMMUNITY)
Admission: EM | Admit: 2021-05-29 | Discharge: 2021-05-31 | Disposition: A | Discharge status: Home / Self Care | Payer: Medicare PPO | Attending: Internal Medicine | Admitting: Internal Medicine

## 2021-05-29 ENCOUNTER — Encounter (HOSPITAL_COMMUNITY): Payer: Self-pay | Admitting: *Deleted

## 2021-05-29 DIAGNOSIS — R079 Chest pain, unspecified: Secondary | ICD-10-CM | POA: Diagnosis not present

## 2021-05-29 DIAGNOSIS — E669 Obesity, unspecified: Secondary | ICD-10-CM | POA: Diagnosis present

## 2021-05-29 DIAGNOSIS — E785 Hyperlipidemia, unspecified: Secondary | ICD-10-CM

## 2021-05-29 DIAGNOSIS — R0789 Other chest pain: Secondary | ICD-10-CM | POA: Diagnosis not present

## 2021-05-29 DIAGNOSIS — Z7901 Long term (current) use of anticoagulants: Secondary | ICD-10-CM | POA: Insufficient documentation

## 2021-05-29 DIAGNOSIS — I214 Non-ST elevation (NSTEMI) myocardial infarction: Principal | ICD-10-CM | POA: Diagnosis present

## 2021-05-29 DIAGNOSIS — Z79899 Other long term (current) drug therapy: Secondary | ICD-10-CM | POA: Diagnosis not present

## 2021-05-29 DIAGNOSIS — M79602 Pain in left arm: Secondary | ICD-10-CM | POA: Insufficient documentation

## 2021-05-29 DIAGNOSIS — Z20822 Contact with and (suspected) exposure to covid-19: Secondary | ICD-10-CM | POA: Diagnosis not present

## 2021-05-29 DIAGNOSIS — Z951 Presence of aortocoronary bypass graft: Secondary | ICD-10-CM

## 2021-05-29 DIAGNOSIS — N183 Chronic kidney disease, stage 3 unspecified: Secondary | ICD-10-CM

## 2021-05-29 DIAGNOSIS — I129 Hypertensive chronic kidney disease with stage 1 through stage 4 chronic kidney disease, or unspecified chronic kidney disease: Secondary | ICD-10-CM | POA: Diagnosis not present

## 2021-05-29 DIAGNOSIS — R778 Other specified abnormalities of plasma proteins: Secondary | ICD-10-CM

## 2021-05-29 DIAGNOSIS — I517 Cardiomegaly: Secondary | ICD-10-CM | POA: Diagnosis not present

## 2021-05-29 DIAGNOSIS — I2510 Atherosclerotic heart disease of native coronary artery without angina pectoris: Secondary | ICD-10-CM | POA: Diagnosis present

## 2021-05-29 DIAGNOSIS — I10 Essential (primary) hypertension: Secondary | ICD-10-CM | POA: Diagnosis present

## 2021-05-29 DIAGNOSIS — Z6830 Body mass index (BMI) 30.0-30.9, adult: Secondary | ICD-10-CM | POA: Insufficient documentation

## 2021-05-29 DIAGNOSIS — R7989 Other specified abnormal findings of blood chemistry: Secondary | ICD-10-CM | POA: Diagnosis not present

## 2021-05-29 DIAGNOSIS — M25562 Pain in left knee: Secondary | ICD-10-CM | POA: Diagnosis not present

## 2021-05-29 HISTORY — DX: Chronic kidney disease, stage 3 unspecified: N18.30

## 2021-05-29 HISTORY — DX: Paroxysmal atrial fibrillation: I48.0

## 2021-05-29 LAB — CBC
HCT: 40.6 % (ref 36.0–46.0)
Hemoglobin: 13 g/dL (ref 12.0–15.0)
MCH: 27.5 pg (ref 26.0–34.0)
MCHC: 32 g/dL (ref 30.0–36.0)
MCV: 86 fL (ref 80.0–100.0)
Platelets: 207 10*3/uL (ref 150–400)
RBC: 4.72 MIL/uL (ref 3.87–5.11)
RDW: 13.5 % (ref 11.5–15.5)
WBC: 6 10*3/uL (ref 4.0–10.5)
nRBC: 0 % (ref 0.0–0.2)

## 2021-05-29 NOTE — ED Triage Notes (Signed)
PT states L arm pain during the night, starting Monday.  States today she began having chest pain and her monitor showed an irregular heartbeat.   Pt is also afraid that the pain in her L knee is a blood clot.

## 2021-05-30 ENCOUNTER — Encounter (HOSPITAL_COMMUNITY)
Admission: EM | Disposition: A | Discharge status: Home / Self Care | Payer: Self-pay | Source: Home / Self Care | Attending: Emergency Medicine

## 2021-05-30 ENCOUNTER — Observation Stay (HOSPITAL_BASED_OUTPATIENT_CLINIC_OR_DEPARTMENT_OTHER): Payer: Medicare PPO

## 2021-05-30 ENCOUNTER — Emergency Department (HOSPITAL_COMMUNITY): Payer: Medicare PPO

## 2021-05-30 DIAGNOSIS — M79602 Pain in left arm: Secondary | ICD-10-CM | POA: Diagnosis not present

## 2021-05-30 DIAGNOSIS — I2510 Atherosclerotic heart disease of native coronary artery without angina pectoris: Secondary | ICD-10-CM

## 2021-05-30 DIAGNOSIS — Z951 Presence of aortocoronary bypass graft: Secondary | ICD-10-CM | POA: Diagnosis not present

## 2021-05-30 DIAGNOSIS — R778 Other specified abnormalities of plasma proteins: Secondary | ICD-10-CM | POA: Insufficient documentation

## 2021-05-30 DIAGNOSIS — I214 Non-ST elevation (NSTEMI) myocardial infarction: Secondary | ICD-10-CM

## 2021-05-30 DIAGNOSIS — R7989 Other specified abnormal findings of blood chemistry: Secondary | ICD-10-CM | POA: Diagnosis not present

## 2021-05-30 DIAGNOSIS — I10 Essential (primary) hypertension: Secondary | ICD-10-CM | POA: Diagnosis not present

## 2021-05-30 DIAGNOSIS — Z955 Presence of coronary angioplasty implant and graft: Secondary | ICD-10-CM | POA: Diagnosis not present

## 2021-05-30 DIAGNOSIS — I129 Hypertensive chronic kidney disease with stage 1 through stage 4 chronic kidney disease, or unspecified chronic kidney disease: Secondary | ICD-10-CM | POA: Diagnosis not present

## 2021-05-30 DIAGNOSIS — Z6830 Body mass index (BMI) 30.0-30.9, adult: Secondary | ICD-10-CM | POA: Diagnosis not present

## 2021-05-30 DIAGNOSIS — E669 Obesity, unspecified: Secondary | ICD-10-CM | POA: Diagnosis not present

## 2021-05-30 DIAGNOSIS — N183 Chronic kidney disease, stage 3 unspecified: Secondary | ICD-10-CM | POA: Diagnosis not present

## 2021-05-30 DIAGNOSIS — Z20822 Contact with and (suspected) exposure to covid-19: Secondary | ICD-10-CM | POA: Diagnosis not present

## 2021-05-30 DIAGNOSIS — M25562 Pain in left knee: Secondary | ICD-10-CM | POA: Diagnosis not present

## 2021-05-30 DIAGNOSIS — I82409 Acute embolism and thrombosis of unspecified deep veins of unspecified lower extremity: Secondary | ICD-10-CM | POA: Diagnosis not present

## 2021-05-30 HISTORY — PX: LEFT HEART CATH AND CORS/GRAFTS ANGIOGRAPHY: CATH118250

## 2021-05-30 HISTORY — PX: CORONARY STENT INTERVENTION: CATH118234

## 2021-05-30 LAB — HEPARIN LEVEL (UNFRACTIONATED): Heparin Unfractionated: 0.31 IU/mL (ref 0.30–0.70)

## 2021-05-30 LAB — ECHOCARDIOGRAM COMPLETE
Area-P 1/2: 3.97 cm2
Height: 66 in
S' Lateral: 3.1 cm
Weight: 3216.95 oz

## 2021-05-30 LAB — BASIC METABOLIC PANEL
Anion gap: 7 (ref 5–15)
BUN: 26 mg/dL — ABNORMAL HIGH (ref 8–23)
CO2: 28 mmol/L (ref 22–32)
Calcium: 9.4 mg/dL (ref 8.9–10.3)
Chloride: 102 mmol/L (ref 98–111)
Creatinine, Ser: 1.23 mg/dL — ABNORMAL HIGH (ref 0.44–1.00)
GFR, Estimated: 45 mL/min — ABNORMAL LOW (ref >60)
Glucose, Bld: 102 mg/dL — ABNORMAL HIGH (ref 70–99)
Potassium: 4.4 mmol/L (ref 3.5–5.1)
Sodium: 137 mmol/L (ref 135–145)

## 2021-05-30 LAB — POCT ACTIVATED CLOTTING TIME
Activated Clotting Time: 283 seconds
Activated Clotting Time: 289 seconds
Activated Clotting Time: 526 seconds
Activated Clotting Time: 221 seconds

## 2021-05-30 LAB — TROPONIN I (HIGH SENSITIVITY)
Troponin I (High Sensitivity): 242 ng/L (ref <18)
Troponin I (High Sensitivity): 244 ng/L (ref <18)

## 2021-05-30 LAB — APTT: aPTT: 29 seconds (ref 24–36)

## 2021-05-30 LAB — RESP PANEL BY RT-PCR (FLU A&B, COVID) ARPGX2
Influenza A by PCR: NEGATIVE
Influenza B by PCR: NEGATIVE
SARS Coronavirus 2 by RT PCR: NEGATIVE

## 2021-05-30 SURGERY — LEFT HEART CATH AND CORS/GRAFTS ANGIOGRAPHY
Anesthesia: LOCAL

## 2021-05-30 MED ORDER — ONDANSETRON HCL 4 MG/2ML IJ SOLN: 4.0000 mg | Freq: Four times a day (QID) | INTRAMUSCULAR | Status: DC | PRN

## 2021-05-30 MED ORDER — SODIUM CHLORIDE 0.9 % IV SOLN: INTRAVENOUS | Status: AC

## 2021-05-30 MED ORDER — IRBESARTAN 150 MG PO TABS
300.0000 mg | ORAL_TABLET | Freq: Every day | ORAL | Status: DC
  Administered 2021-05-30 – 2021-05-31 (×2): 300 mg via ORAL
  Filled 2021-05-30: qty 1
  Filled 2021-05-30 (×2): qty 2

## 2021-05-30 MED ORDER — ASPIRIN EC 81 MG PO TBEC
81.0000 mg | DELAYED_RELEASE_TABLET | Freq: Every day | ORAL | Status: DC
  Administered 2021-05-30 – 2021-05-31 (×2): 81 mg via ORAL
  Filled 2021-05-30 (×2): qty 1

## 2021-05-30 MED ORDER — ASPIRIN 81 MG PO CHEW: 81.0000 mg | CHEWABLE_TABLET | ORAL | Status: DC

## 2021-05-30 MED ORDER — HEPARIN (PORCINE) IN NACL 1000-0.9 UT/500ML-% IV SOLN
INTRAVENOUS | Status: DC | PRN
  Administered 2021-05-30 (×2): 500 mL

## 2021-05-30 MED ORDER — ASPIRIN 81 MG PO CHEW: 81.0000 mg | CHEWABLE_TABLET | Freq: Every day | ORAL | Status: DC

## 2021-05-30 MED ORDER — CARVEDILOL 6.25 MG PO TABS
6.2500 mg | ORAL_TABLET | Freq: Two times a day (BID) | ORAL | Status: DC
  Administered 2021-05-30 – 2021-05-31 (×3): 6.25 mg via ORAL
  Filled 2021-05-30: qty 1
  Filled 2021-05-30: qty 2
  Filled 2021-05-30 (×2): qty 1

## 2021-05-30 MED ORDER — HEPARIN SODIUM (PORCINE) 1000 UNIT/ML IJ SOLN
INTRAMUSCULAR | Status: DC | PRN
  Administered 2021-05-30 (×2): 1500 [IU] via INTRAVENOUS
  Administered 2021-05-30: 9500 [IU] via INTRAVENOUS

## 2021-05-30 MED ORDER — ATROPINE SULFATE 1 MG/10ML IJ SOSY
PREFILLED_SYRINGE | INTRAMUSCULAR | Status: AC
  Filled 2021-05-30: qty 10

## 2021-05-30 MED ORDER — ALBUTEROL SULFATE (2.5 MG/3ML) 0.083% IN NEBU: 2.5000 mg | INHALATION_SOLUTION | RESPIRATORY_TRACT | Status: DC | PRN

## 2021-05-30 MED ORDER — HEPARIN SODIUM (PORCINE) 1000 UNIT/ML IJ SOLN
INTRAMUSCULAR | Status: AC
  Filled 2021-05-30: qty 10

## 2021-05-30 MED ORDER — IOHEXOL 350 MG/ML SOLN
INTRAVENOUS | Status: DC | PRN
  Administered 2021-05-30: 190 mL

## 2021-05-30 MED ORDER — ROSUVASTATIN CALCIUM 20 MG PO TABS
20.0000 mg | ORAL_TABLET | Freq: Every day | ORAL | Status: DC
  Administered 2021-05-30: 20 mg via ORAL
  Filled 2021-05-30 (×2): qty 1

## 2021-05-30 MED ORDER — ROSUVASTATIN CALCIUM 5 MG PO TABS: 5.0000 mg | ORAL_TABLET | Freq: Every day | ORAL | Status: DC

## 2021-05-30 MED ORDER — SODIUM CHLORIDE 0.9 % IV SOLN: 250.0000 mL | INTRAVENOUS | Status: DC | PRN

## 2021-05-30 MED ORDER — NITROGLYCERIN 1 MG/10 ML FOR IR/CATH LAB
INTRA_ARTERIAL | Status: AC
  Filled 2021-05-30: qty 10

## 2021-05-30 MED ORDER — SODIUM CHLORIDE 0.9% FLUSH: 3.0000 mL | INTRAVENOUS | Status: DC | PRN

## 2021-05-30 MED ORDER — SODIUM CHLORIDE 0.9 % IV SOLN: INTRAVENOUS | Status: DC

## 2021-05-30 MED ORDER — CLOPIDOGREL BISULFATE 75 MG PO TABS
75.0000 mg | ORAL_TABLET | Freq: Every day | ORAL | Status: DC
  Administered 2021-05-31: 75 mg via ORAL
  Filled 2021-05-30: qty 1

## 2021-05-30 MED ORDER — LORATADINE 10 MG PO TABS
10.0000 mg | ORAL_TABLET | Freq: Every day | ORAL | Status: DC
Start: 2021-05-30 — End: 2021-05-31
  Administered 2021-05-30 – 2021-05-31 (×2): 10 mg via ORAL
  Filled 2021-05-30 (×2): qty 1

## 2021-05-30 MED ORDER — HEPARIN (PORCINE) IN NACL 1000-0.9 UT/500ML-% IV SOLN
INTRAVENOUS | Status: AC
  Filled 2021-05-30: qty 1000

## 2021-05-30 MED ORDER — HEPARIN (PORCINE) 25000 UT/250ML-% IV SOLN
1300.0000 [IU]/h | INTRAVENOUS | Status: DC
  Administered 2021-05-30: 1300 [IU]/h via INTRAVENOUS
  Filled 2021-05-30: qty 250

## 2021-05-30 MED ORDER — TICAGRELOR 90 MG PO TABS
ORAL_TABLET | ORAL | Status: DC | PRN
  Administered 2021-05-30: 180 mg via ORAL

## 2021-05-30 MED ORDER — SODIUM CHLORIDE 0.9% FLUSH
3.0000 mL | Freq: Two times a day (BID) | INTRAVENOUS | Status: DC
  Administered 2021-05-31: 3 mL via INTRAVENOUS

## 2021-05-30 MED ORDER — TRAMADOL HCL 50 MG PO TABS: 50.0000 mg | ORAL_TABLET | Freq: Four times a day (QID) | ORAL | Status: DC | PRN

## 2021-05-30 MED ORDER — HYDRALAZINE HCL 20 MG/ML IJ SOLN: 10.0000 mg | INTRAMUSCULAR | Status: AC | PRN

## 2021-05-30 MED ORDER — MIDAZOLAM HCL 2 MG/2ML IJ SOLN
INTRAMUSCULAR | Status: DC | PRN
  Administered 2021-05-30 (×2): 1 mg via INTRAVENOUS

## 2021-05-30 MED ORDER — TICAGRELOR 90 MG PO TABS
ORAL_TABLET | ORAL | Status: AC
  Filled 2021-05-30: qty 1

## 2021-05-30 MED ORDER — LIDOCAINE HCL (PF) 1 % IJ SOLN
INTRAMUSCULAR | Status: AC
  Filled 2021-05-30: qty 30

## 2021-05-30 MED ORDER — LABETALOL HCL 5 MG/ML IV SOLN: 10.0000 mg | INTRAVENOUS | Status: AC | PRN

## 2021-05-30 MED ORDER — MIDAZOLAM HCL 2 MG/2ML IJ SOLN
INTRAMUSCULAR | Status: AC
  Filled 2021-05-30: qty 2

## 2021-05-30 MED ORDER — FLUTICASONE PROPIONATE 50 MCG/ACT NA SUSP
1.0000 | Freq: Every day | NASAL | Status: DC
  Administered 2021-05-30 – 2021-05-31 (×2): 1 via NASAL
  Filled 2021-05-30: qty 16

## 2021-05-30 MED ORDER — LIDOCAINE HCL (PF) 1 % IJ SOLN
INTRAMUSCULAR | Status: DC | PRN
  Administered 2021-05-30: 10 mL

## 2021-05-30 MED ORDER — ACETAMINOPHEN 500 MG PO TABS: 500.0000 mg | ORAL_TABLET | Freq: Four times a day (QID) | ORAL | Status: DC | PRN

## 2021-05-30 MED ORDER — ACETAMINOPHEN 325 MG PO TABS
650.0000 mg | ORAL_TABLET | ORAL | Status: DC | PRN
  Administered 2021-05-31: 650 mg via ORAL
  Filled 2021-05-30: qty 2

## 2021-05-30 MED ORDER — HYDROCHLOROTHIAZIDE 25 MG PO TABS
25.0000 mg | ORAL_TABLET | Freq: Every day | ORAL | Status: DC
  Administered 2021-05-30 – 2021-05-31 (×2): 25 mg via ORAL
  Filled 2021-05-30 (×2): qty 1

## 2021-05-30 MED ORDER — SODIUM CHLORIDE 0.9% FLUSH: 3.0000 mL | Freq: Two times a day (BID) | INTRAVENOUS | Status: DC

## 2021-05-30 MED ORDER — DIPHENHYDRAMINE HCL 25 MG PO CAPS: 25.0000 mg | ORAL_CAPSULE | Freq: Four times a day (QID) | ORAL | Status: DC | PRN

## 2021-05-30 MED ORDER — LIDOCAINE 5 % EX PTCH
1.0000 | MEDICATED_PATCH | CUTANEOUS | Status: AC
  Administered 2021-05-30: 1 via TRANSDERMAL
  Filled 2021-05-30: qty 1

## 2021-05-30 MED ORDER — NITROGLYCERIN 0.4 MG SL SUBL: 0.4000 mg | SUBLINGUAL_TABLET | SUBLINGUAL | Status: DC | PRN

## 2021-05-30 MED ORDER — NITROGLYCERIN 1 MG/10 ML FOR IR/CATH LAB
INTRA_ARTERIAL | Status: DC | PRN
  Administered 2021-05-30 (×2): 200 ug via INTRACORONARY

## 2021-05-30 SURGICAL SUPPLY — 18 items
BALLN SAPPHIRE 2.0X12 (BALLOONS) ×3
BALLN SAPPHIRE ~~LOC~~ 3.25X18 (BALLOONS) ×2 IMPLANT
BALLOON SAPPHIRE 2.0X12 (BALLOONS) IMPLANT
CATH INFINITI 5 FR IM (CATHETERS) ×2 IMPLANT
CATH INFINITI 5 FR LCB (CATHETERS) ×2 IMPLANT
CATH INFINITI 5FR MULTPACK ANG (CATHETERS) ×2 IMPLANT
CATH LAUNCHER 6FR JR4 (CATHETERS) ×2 IMPLANT
CATHETER LAUNCHER 6FR JR4 SH (CATHETERS) ×2 IMPLANT
KIT ENCORE 26 ADVANTAGE (KITS) ×2 IMPLANT
KIT HEART LEFT (KITS) ×3 IMPLANT
PACK CARDIAC CATHETERIZATION (CUSTOM PROCEDURE TRAY) ×3 IMPLANT
SHEATH PINNACLE 5F 10CM (SHEATH) ×2 IMPLANT
SHEATH PINNACLE 6F 10CM (SHEATH) ×2 IMPLANT
STENT ONYX FRONTIER 3.0X34 (Permanent Stent) ×2 IMPLANT
TRANSDUCER W/STOPCOCK (MISCELLANEOUS) ×3 IMPLANT
TUBING CIL FLEX 10 FLL-RA (TUBING) ×3 IMPLANT
WIRE ASAHI PROWATER 180CM (WIRE) ×2 IMPLANT
WIRE EMERALD 3MM-J .035X150CM (WIRE) ×2 IMPLANT

## 2021-05-30 NOTE — Progress Notes (Signed)
ANTICOAGULATION CONSULT NOTE - Initial Consult  Pharmacy Consult for heparin Indication:  elevated troponin, post-op Afib, and h/o VTE  Allergies  Allergen Reactions   Metronidazole Hives   Atorvastatin     Muscle pain    Ciprofloxacin Hives   Fentanyl Hives    Itching all over    Morphine And Related     Hallucinations    Tetanus Toxoids     Localized knot    Augmentin [Amoxicillin-Pot Clavulanate] Nausea And Vomiting    Pt has taken Keflex (cephalexin) without adverse reaction. Started 08/06/16    Patient Measurements: Height: 5\' 6"  (167.6 cm) Weight: 86.6 kg (191 lb) IBW/kg (Calculated) : 59.3 Heparin Dosing Weight: 80kg  Vital Signs: Temp: 97.8 F (36.6 C) (11/24 2334) Temp Source: Oral (11/24 2334) BP: 176/52 (11/25 0200) Pulse Rate: 56 (11/25 0200)  Labs: Recent Labs    05/29/21 2337 05/30/21 0210  HGB 13.0  --   HCT 40.6  --   PLT 207  --   CREATININE 1.23*  --   TROPONINIHS 242* 244*    Estimated Creatinine Clearance: 41.8 mL/min (A) (by C-G formula based on SCr of 1.23 mg/dL (H)).   Medical History: Past Medical History:  Diagnosis Date   Allergy    Anemia    Hx   Anginal pain (HCC)    Arthritis    neck, lower back   Asthma    Change in bowel habits    Chronic kidney disease    Complication of anesthesia    Coronary artery disease    Dyspnea    GERD (gastroesophageal reflux disease)    HLD (hyperlipidemia)    hx - no med   HTN (hypertension)    Migraine    Myocardial infarction (HCC)    Pneumonia    PONV (postoperative nausea and vomiting)    Stented coronary artery    right, no problems   SVD (spontaneous vaginal delivery)    x 2   Vitamin B12 deficiency     Assessment: 80yo female c/o LUE pain since Monday then CP and irregular heartbeat late Thursday, to transition from Eliquis to heparin during admission; last dose of Eliquis taken 11/24 8p.  Goal of Therapy:  Heparin level 0.3-0.7 units/ml aPTT 66/102 seconds Monitor  platelets by anticoagulation protocol: Yes   Plan:  At 0800 begin heparin infusion at 1300 units/hr and monitor heparin levels, aPTT (while Eliquis affects anti-Xa), and CBC.  Krystal Mcpherson, PharmD, BCPS  05/30/2021,3:15 AM

## 2021-05-30 NOTE — H&P (View-Only) (Signed)
Progress Note  Patient Name: Krystal Mcpherson Date of Encounter: 05/30/2021  [facility] Cardiologist: Candee Furbish, MD   Subjective   Dnies CP   No arm pain now    Does complain of L knee pain   having problem walking     Inpatient Medications    Scheduled Meds:  aspirin EC  81 mg Oral Daily   carvedilol  6.25 mg Oral BID WC   fluticasone  1 spray Each Nare Daily   hydrochlorothiazide  25 mg Oral Daily   irbesartan  300 mg Oral Daily   lidocaine  1 patch Transdermal Q24H   loratadine  10 mg Oral Daily   rosuvastatin  5 mg Oral QHS   Continuous Infusions:  heparin     PRN Meds: acetaminophen, albuterol, diphenhydrAMINE, nitroGLYCERIN, ondansetron (ZOFRAN) IV, traMADol   Vital Signs    Vitals:   05/30/21 0300 05/30/21 0400 05/30/21 0459 05/30/21 0532  BP:  (!) 160/48 (!) 151/70 (!) 162/49  Pulse: (!) 49 (!) 50 (!) 55 (!) 56  Resp:  15 12 18   Temp:   97.6 F (36.4 C) 97.6 F (36.4 C)  TempSrc:   Oral Oral  SpO2:  98% 96% 99%  Weight:    91.2 kg  Height:    5\' 6"  (1.676 m)   No intake or output data in the 24 hours ending 05/30/21 0901 Last 3 Weights 05/30/2021 05/29/2021 03/21/2021  Weight (lbs) 201 lb 1 oz 191 lb 201 lb  Weight (kg) 91.2 kg 86.637 kg 91.173 kg      Telemetry    SR  - Personally Reviewed  ECG     - Personally Reviewed  Physical Exam   GEN: No acute distress.   Neck: No JVD Cardiac: RRR, no murmurs, Respiratory: Clear to auscultation bilaterally. GI: Soft, nontender, non-distended  MS: No edema; No deformity.  L knew sl swollen  Tender on medai aspect   Neuro:  Nonfocal  Psych: Normal affect   Labs    High Sensitivity Troponin:   Recent Labs  Lab 05/29/21 2337 05/30/21 0210  TROPONINIHS 242* 244*     Chemistry Recent Labs  Lab 05/29/21 2337  NA 137  K 4.4  CL 102  CO2 28  GLUCOSE 102*  BUN 26*  CREATININE 1.23*  CALCIUM 9.4  GFRNONAA 45*  ANIONGAP 7    Lipids No results for input(s): CHOL, TRIG, HDL, LABVLDL,  LDLCALC, CHOLHDL in the last 168 hours.  Hematology Recent Labs  Lab 05/29/21 2337  WBC 6.0  RBC 4.72  HGB 13.0  HCT 40.6  MCV 86.0  MCH 27.5  MCHC 32.0  RDW 13.5  PLT 207   Thyroid No results for input(s): TSH, FREET4 in the last 168 hours.  BNPNo results for input(s): BNP, PROBNP in the last 168 hours.  DDimer No results for input(s): DDIMER in the last 168 hours.   Radiology    DG Chest 2 View  Result Date: 05/30/2021 CLINICAL DATA:  Chest pain. EXAM: CHEST - 2 VIEW COMPARISON:  Chest radiograph dated 01/16/2021. FINDINGS: No focal consolidation, pleural effusion, pneumothorax. Mild cardiomegaly. Median sternotomy wires and left atrial appendage occlusive device. No acute osseous pathology. IMPRESSION: No active cardiopulmonary disease. Electronically Signed   By: Anner Crete M.D.   On: 05/30/2021 00:10   DG Knee Complete 4 Views Left  Result Date: 05/30/2021 CLINICAL DATA:  Left knee pain. EXAM: LEFT KNEE - COMPLETE 4+ VIEW COMPARISON:  None. FINDINGS: No  evidence of fracture, dislocation, or joint effusion. No evidence of severe arthropathy. No aggressive appearing focal bone abnormality. Suggestion of varicose veins. Otherwise soft tissues are unremarkable. IMPRESSION: No acute displaced fracture or dislocation. Electronically Signed   By: Iven Finn M.D.   On: 05/30/2021 00:42    Cardiac Studies    Echo 05/30/21  Left ventricular ejection fraction, by estimation, is 55 to 60%. The left ventricle has normal function. The left ventricle has no regional wall motion abnormalities. Left ventricular diastolic parameters were normal. 1. 2. Right ventricular systolic function is normal. The right ventricular size is normal. 3. Left atrial size was mildly dilated. 4. Right atrial size was mildly dilated. The mitral valve is abnormal. Mild mitral valve regurgitation. No evidence of mitral stenosis. 5. 6. Tricuspid valve regurgitation is moderate. The aortic valve  is normal in structure. Aortic valve regurgitation is not visualized. No aortic stenosis is present. 7. The inferior vena cava is normal in size with greater than 50% respiratory variability, suggesting right atrial pressure of 3 mmHg. TTE 11/28/20   1. Left ventricular ejection fraction, by estimation, is 55 to 60%. The left ventricle has normal function. The left ventricle demonstrates regional wall motion abnormalities (see scoring diagram/findings for description). Left ventricular diastolic parameters are consistent with Grade I diastolic dysfunction (impaired relaxation). 2. Right ventricular systolic function is normal. The right ventricular size is normal. There is normal pulmonary artery systolic pressure. The estimated right ventricular systolic pressure is 48.1 mmHg. 3. The mitral valve is grossly normal. Trivial mitral valve regurgitation. No evidence of mitral stenosis. 4. The aortic valve is tricuspid. Aortic valve regurgitation is not visualized. No aortic stenosis is present. 5. The inferior vena cava is normal in size with greater than 50% respiratory variability, suggesting right atrial pressure of 3 mmHg.   Left heart cath 11/28/2020: Prox LAD to Mid LAD lesion is 90% stenosed. 1st Diag lesion is 80% stenosed. 1st Mrg lesion is 90% stenosed. Mid Cx lesion is 100% stenosed. Prox RCA lesion is 30% stenosed. Prox RCA to Mid RCA lesion is 50% stenosed. LV end diastolic pressure is mildly elevated.   1. 3 vessel obstructive CAD    - 90% proximal to mid LAD, 80% first diagonal    - 90% large OM1, 100% LCX post OM1 with right to left collaterals.    - Patent stent in the proximal RCA. 50% mid vessel 2. Mildly elevated LVEDP  Patient Profile   Krystal Mcpherson is a 80 y.o. female with a PMHx of CAD s/p PCI to RCA 2012 and subsequent CABG (LIMA-LAD, left radial-OM and RI) 12/2020, hypertension, hyperlipidemia, DVT, bradycardia, postoperative atrial fibrillation, SVT  who is  being seen 05/30/2021 for the evaluation of left arm pain. Assessment & Plan    1  Elevated troponin  Pt presented with arm pain, chest discomfort   Intermittent over past week    Hx of CAD with CABG in June 2022   Symptoms are new    She is a difficult historian  Not feeling well   NO recent illness otherwise     Trop is elevated (200s), relatively flat Echo with normal LV and RV function   Review of cath from Spring 2022, the pt has  severe diffuse dz. ( ? If problem with graft to these vessels)    Reviewed with intervention service   Would recomm L heart cath to redefine anatomy   Risks/benefits described   patient understands and agrees to proceed  with this .  2  CAD   As noted above   with CABG in June 2022  3  L knee pain   The pt also has L knee pain   Did have DVT in May on R leg   Symptoms were different in leg at that time   And she is on Eliquis athome (heparin here)   I do not think DVT in L (with possible PE)  with anticoagulation  But, will check D Dimer  XRAy of knee is negatreive   4  HTN BP has been running high   She says started going up a few wks ago  She is on Benicar and HCTZ at home  Question low dose metoprolol      Carvedilol added here Consdier amldoipine on return    5 HL   Keep on statin     For questions or updates, please contact [facility] Please consult [email or web address] for contact info under        Signed, Dorris Carnes, MD  05/30/2021, 9:01 AM

## 2021-05-30 NOTE — ED Notes (Signed)
PA notified of critical troponin of 242.

## 2021-05-30 NOTE — Interval H&P Note (Signed)
Cath Lab Visit (complete for each Cath Lab visit)  Clinical Evaluation Leading to the Procedure:   ACS: Yes.    Non-ACS:    Anginal Classification: CCS III  Anti-ischemic medical therapy: Minimal Therapy (1 class of medications)  Non-Invasive Test Results: No non-invasive testing performed  Prior CABG: Previous CABG      History and Physical Interval Note:  05/30/2021 3:52 PM  Krystal Mcpherson  has presented today for surgery, with the diagnosis of chest pain.  The various methods of treatment have been discussed with the patient and family. After consideration of risks, benefits and other options for treatment, the patient has consented to  Procedure(s): LEFT HEART CATH AND CORS/GRAFTS ANGIOGRAPHY (N/A) as a surgical intervention.  The patient's history has been reviewed, patient examined, no change in status, stable for surgery.  I have reviewed the patient's chart and labs.  Questions were answered to the patient's satisfaction.     Shelva Majestic

## 2021-05-30 NOTE — ED Provider Notes (Signed)
[facility] EMERGENCY DEPARTMENT Provider Note   CSN: [id number] Arrival date & time: 05/29/21  2313     History Chief Complaint  Patient presents with   Chest Pain    Krystal Mcpherson is a 80 y.o. female.  The history is provided by the patient and medical records.  Chest Pain  80 year old female with history of anemia, chronic kidney disease, coronary artery disease status post 3 vessel CABG in June 2022, presenting to the ED for left arm pain.  States for the past week almost she has been having pain in her left arm which makes her hand feel numb.  States nightly nausea as well.  Denies any real SOB or chest pain, states it just feels uncomfortable.  States tonight heart monitor showed "irregular heart beat" and she was concerned.  Also reports some left knee pain for the past few weeks.  Denies injury but states a lot of "popping and cracking" in her knee.  Does report hx of clot in right leg in June as well, is still on eliquis without missed doses.  Past Medical History:  Diagnosis Date   Allergy    Anemia    Hx   Anginal pain (HCC)    Arthritis    neck, lower back   Asthma    Change in bowel habits    Chronic kidney disease    Complication of anesthesia    Coronary artery disease    Dyspnea    GERD (gastroesophageal reflux disease)    HLD (hyperlipidemia)    hx - no med   HTN (hypertension)    Migraine    Myocardial infarction ([place])    Pneumonia    PONV (postoperative nausea and vomiting)    Stented coronary artery    right, no problems   SVD (spontaneous vaginal delivery)    x 2   Vitamin B12 deficiency     Patient Active Problem List   Diagnosis Date Noted   S/P CABG x 3 12/04/2020   Coronary artery disease 12/04/2020   NSTEMI (non-ST elevated myocardial infarction) ([place]) 11/28/2020   Cellulitis 08/09/2016   Dog bite, hand, unspecified laterality, subsequent encounter 08/09/2016   Change in bowel habits    Obesity 03/19/2015   Angina  decubitus ([place]) 08/01/2014   Atherosclerosis of native coronary artery of native heart without angina pectoris 03/02/2014   Essential hypertension, benign 03/02/2014   Bradycardia 03/02/2014    Past Surgical History:  Procedure Laterality Date   bladder tact     chiara malformation  2001   surgery for decompression   CHOLECYSTECTOMY     CLIPPING OF ATRIAL APPENDAGE  12/04/2020   Procedure: CLIPPING OF LEFT ATRIAL APPENDAGE;  Surgeon: Wonda Olds, MD;  Location: [place];  Service: Open Heart Surgery;;   COLONOSCOPY  2009    [facility]   cornary stent Right 05/2011   CORONARY ARTERY BYPASS GRAFT N/A 12/04/2020   Procedure: CORONARY ARTERY BYPASS GRAFTING (CABG), ON PUMP, TIMES THREE, USING LEFT INTERNAL MAMMARY ARTERY AND LEFT RADIAL ARTERY;  Surgeon: Wonda Olds, MD;  Location: [place];  Service: Open Heart Surgery;  Laterality: N/A;   LEFT HEART CATH AND CORONARY ANGIOGRAPHY N/A 11/28/2020   Procedure: LEFT HEART CATH AND CORONARY ANGIOGRAPHY;  Surgeon: Martinique, Peter M, MD;  Location: [place] [facility];  Service: Cardiovascular;  Laterality: N/A;   NASAL SINUS SURGERY     x 2   RADIAL ARTERY HARVEST Left 12/04/2020   Procedure:  Left RADIAL ARTERY HARVEST;  Surgeon: Wonda Olds, MD;  Location: [facility];  Service: Open Heart Surgery;  Laterality: Left;   TEE WITHOUT CARDIOVERSION N/A 12/04/2020   Procedure: TRANSESOPHAGEAL ECHOCARDIOGRAM (TEE);  Surgeon: Wonda Olds, MD;  Location: [facility];  Service: Open Heart Surgery;  Laterality: N/A;   TOTAL ABDOMINAL HYSTERECTOMY W/ BILATERAL SALPINGOOPHORECTOMY       OB History   No obstetric history on file.     Family History  Problem Relation Age of Onset   Colon cancer Paternal Grandmother    Hypertension Mother    Heart attack Mother    Hypertension Father    Heart attack Father    Arthritis Sister    Obesity Sister    Hypertension Sister    Thyroid cancer Sister    Kidney cancer Sister    Breast cancer Sister     Hypertension Sister    Pancreatitis Sister    Melanoma Sister    Hypertension Sister    Esophageal cancer Neg Hx    Rectal cancer Neg Hx    Stomach cancer Neg Hx     Social History   Tobacco Use   Smoking status: Never   Smokeless tobacco: Never  Vaping Use   Vaping Use: Never used  Substance Use Topics   Alcohol use: No    Alcohol/week: 0.0 standard drinks   Drug use: No    Home Medications Prior to Admission medications   Medication Sig Start Date End Date Taking? Authorizing Provider  acetaminophen (TYLENOL) 500 MG tablet Take 500-1,000 mg by mouth every 6 (six) hours as needed for mild pain (pain).   Yes [provider]  albuterol (PROVENTIL HFA;VENTOLIN HFA) 108 (90 BASE) MCG/ACT inhaler Inhale 2 puffs into the lungs every 4 (four) hours as needed for wheezing or shortness of breath. Reported on 10/30/2015   Yes [provider]  apixaban (ELIQUIS) 5 MG TABS tablet Take 5 mg by mouth 2 (two) times daily.   Yes [provider]  Artificial Tear Ointment (DRY EYES OP) Place 1 drop into both eyes 2 (two) times daily as needed (dry eyes).   Yes [provider]  aspirin EC 81 MG EC tablet Take 1 tablet (81 mg total) by mouth daily. Swallow whole. 12/10/20  Yes Lars Pinks M, PA-C  cetirizine (ZYRTEC) 10 MG tablet Take 10 mg by mouth daily.   Yes [provider]  diphenhydrAMINE (BENADRYL) 25 mg capsule Take 25-50 mg by mouth every 6 (six) hours as needed for itching. Reported on 10/30/2015   Yes [provider]  doxycycline (VIBRAMYCIN) 50 MG capsule Take 50 mg by mouth daily. Continuously 04/17/21  Yes [provider]  fluticasone (FLONASE) 50 MCG/ACT nasal spray Place 1 spray into both nostrils daily.   Yes [provider]  hydrochlorothiazide (HYDRODIURIL) 25 MG tablet Take 1 tablet (25 mg total) by mouth daily. 03/14/21 06/12/21 Yes Loel Dubonnet, NP  ibuprofen (ADVIL) 200 MG tablet Take 200-400 mg by  mouth every 6 (six) hours as needed for headache or moderate pain.   Yes [provider]  metoprolol tartrate (LOPRESSOR) 25 MG tablet Take 0.5 tablets (12.5 mg total) by mouth 2 (two) times daily. Patient taking differently: Take 25 mg by mouth 2 (two) times daily. 03/14/21  Yes Loel Dubonnet, NP  metroNIDAZOLE (METROCREAM) 0.75 % cream Apply 1 application topically 2 (two) times daily. 03/11/21  Yes [provider]  Misc Natural Products (ELDERBERRY ZINC/VIT C/IMMUNE  MT) Use as directed 1 tablet in the mouth or throat daily. gummy   Yes [provider]  olmesartan (BENICAR) 40 MG tablet Take 1 tablet (40 mg total) by mouth daily. 04/28/21  Yes Jerline Pain, MD  rosuvastatin (CRESTOR) 5 MG tablet Take 1 tablet (5 mg total) by mouth daily. Patient taking differently: Take 5 mg by mouth at bedtime. 01/11/21  Yes Loel Dubonnet, NP  traMADol (ULTRAM) 50 MG tablet Take 1 tablet (50 mg total) by mouth every 6 (six) hours as needed for severe pain (pain). Reported on 11/13/2015 Patient not taking: Reported on 05/30/2021 12/10/20   Nani Skillern, PA-C    Allergies    Metronidazole, Atorvastatin, Ciprofloxacin, Fentanyl, Morphine and related, Tetanus toxoids, and Augmentin [amoxicillin-pot clavulanate]  Review of Systems   Review of Systems  Cardiovascular:  Positive for chest pain.  Musculoskeletal:  Positive for arthralgias.  All other systems reviewed and are negative.  Physical Exam Updated Vital Signs BP (!) 176/52   Pulse (!) 56   Temp 97.8 F (36.6 C) (Oral)   Resp 20   Ht 5\' 6"  (1.676 m)   Wt 86.6 kg   SpO2 99%   BMI 30.83 kg/m   Physical Exam Vitals and nursing note reviewed.  Constitutional:      Appearance: She is well-developed.  HENT:     Head: Normocephalic and atraumatic.  Eyes:     Conjunctiva/sclera: Conjunctivae normal.     Pupils: Pupils are equal, round, and reactive to light.  Cardiovascular:     Rate and Rhythm: Normal rate  and regular rhythm.     Heart sounds: Normal heart sounds.  Pulmonary:     Effort: Pulmonary effort is normal.     Breath sounds: Normal breath sounds.  Abdominal:     General: Bowel sounds are normal.     Palpations: Abdomen is soft.  Musculoskeletal:        General: Normal range of motion.     Cervical back: Normal range of motion.     Comments: Left knee without overlying erythema, induration, or warmth to touch, no noted calf asymmetry or palpable cords, DP pulses intact BLE  Skin:    General: Skin is warm and dry.  Neurological:     Mental Status: She is alert and oriented to person, place, and time.    ED Results / Procedures / Treatments   Labs (all labs ordered are listed, but only abnormal results are displayed) Labs Reviewed  BASIC METABOLIC PANEL - Abnormal; Notable for the following components:      Result Value   Glucose, Bld 102 (*)    BUN 26 (*)    Creatinine, Ser 1.23 (*)    GFR, Estimated 45 (*)    All other components within normal limits  TROPONIN I (HIGH SENSITIVITY) - Abnormal; Notable for the following components:   Troponin I (High Sensitivity) 242 (*)    All other components within normal limits  RESP PANEL BY RT-PCR (FLU A&B, COVID) ARPGX2  CBC  TROPONIN I (HIGH SENSITIVITY)    EKG EKG Interpretation  Date/Time:  Friday May 30 2021 00:19:33 EST Ventricular Rate:  57 PR Interval:  178 QRS Duration: 83 QT Interval:  471 QTC Calculation: 459 R Axis:   54 Text Interpretation: Sinus rhythm Left atrial enlargement TWI and mild depression in III, TWI new since june, others are much improved Confirmed by Merrily Pew [phone]) on 05/30/2021 12:24:06 AM  Radiology DG Chest 2  View  Result Date: 05/30/2021 CLINICAL DATA:  Chest pain. EXAM: CHEST - 2 VIEW COMPARISON:  Chest radiograph dated 01/16/2021. FINDINGS: No focal consolidation, pleural effusion, pneumothorax. Mild cardiomegaly. Median sternotomy wires and left atrial appendage occlusive  device. No acute osseous pathology. IMPRESSION: No active cardiopulmonary disease. Electronically Signed   By: Anner Crete M.D.   On: 05/30/2021 00:10   DG Knee Complete 4 Views Left  Result Date: 05/30/2021 CLINICAL DATA:  Left knee pain. EXAM: LEFT KNEE - COMPLETE 4+ VIEW COMPARISON:  None. FINDINGS: No evidence of fracture, dislocation, or joint effusion. No evidence of severe arthropathy. No aggressive appearing focal bone abnormality. Suggestion of varicose veins. Otherwise soft tissues are unremarkable. IMPRESSION: No acute displaced fracture or dislocation. Electronically Signed   By: Iven Finn M.D.   On: 05/30/2021 00:42    Procedures Procedures   CRITICAL CARE Performed by: Larene Pickett   Total critical care time: 45 minutes  Critical care time was exclusive of separately billable procedures and treating other patients.  Critical care was necessary to treat or prevent imminent or life-threatening deterioration.  Critical care was time spent personally by me on the following activities: development of treatment plan with patient and/or surrogate as well as nursing, discussions with consultants, evaluation of patient's response to treatment, examination of patient, obtaining history from patient or surrogate, ordering and performing treatments and interventions, ordering and review of laboratory studies, ordering and review of radiographic studies, pulse oximetry and re-evaluation of patient's condition.   Medications Ordered in ED Medications  acetaminophen (TYLENOL) tablet 500-1,000 mg (has no administration in time range)  aspirin EC tablet 81 mg (has no administration in time range)  hydrochlorothiazide (HYDRODIURIL) tablet 25 mg (has no administration in time range)  irbesartan (AVAPRO) tablet 300 mg (has no administration in time range)  rosuvastatin (CRESTOR) tablet 5 mg (has no administration in time range)  albuterol (PROVENTIL) (2.5 MG/3ML) 0.083% nebulizer  solution 2.5 mg (has no administration in time range)  loratadine (CLARITIN) tablet 10 mg (has no administration in time range)  diphenhydrAMINE (BENADRYL) capsule 25-50 mg (has no administration in time range)  fluticasone (FLONASE) 50 MCG/ACT nasal spray 1 spray (has no administration in time range)  nitroGLYCERIN (NITROSTAT) SL tablet 0.4 mg (has no administration in time range)  ondansetron (ZOFRAN) injection 4 mg (has no administration in time range)  carvedilol (COREG) tablet 6.25 mg (has no administration in time range)  traMADol (ULTRAM) tablet 50 mg (has no administration in time range)  lidocaine (LIDODERM) 5 % 1 patch (1 patch Transdermal Patch Applied 05/30/21 0338)  heparin ADULT infusion 100 units/mL (25000 units/237mL) (has no administration in time range)    ED Course  I have reviewed the triage vital signs and the nursing notes.  Pertinent labs & imaging results that were available during my care of the patient were reviewed by me and considered in my medical decision making (see chart for details).    MDM Rules/Calculators/A&P                           80 y.o. F here with left arm pain, nightly nausea, and some mild chest discomfort over the past several days.  Also reports atraumatic left knee pain.  She is s/p 3 vessel CABG in June 2022 following MI with failed PCI.    EKG here with some subtle changes but no STEMI criteria.  Initial trop elevated at 242.  Remainder of  labs overall reassuring.  CXR clear.  Patient without active chest pain at present.  Knee films without acute findings.  Patient expressed concern for DVT.  She is on eliquis which should be preventing this.  Unable to get venous duplex at this hour, can have this done in AM from IP service.  Initially discussed with cardiology fellow, Dr. Alfred Levins, who did not feel this was cardiac and requested medical admission.  I have requested formal consult given her history of recent MI-- he is hesitant to provide this  unless medicine team specifically requested.  Discussed with hospitalist, Dr. Posey Pronto-- given her history, feels she would be better served on cardiology service-- I do agree with this.  Dr. Alfred Levins was notified that cardiology will need to admit patient.  Final Clinical Impression(s) / ED Diagnoses Final diagnoses:  Chest pain in adult  Elevated troponin    Rx / DC Orders ED Discharge Orders     None        Larene Pickett, PA-C 05/30/21 0348    Merrily Pew, MD 05/30/21 [phone]

## 2021-05-30 NOTE — Progress Notes (Addendum)
Progress Note  Patient Name: Krystal Mcpherson Date of Encounter: 05/30/2021  [facility] Cardiologist: Candee Furbish, MD   Subjective   Dnies CP   No arm pain now    Does complain of L knee pain   having problem walking     Inpatient Medications    Scheduled Meds:  aspirin EC  81 mg Oral Daily   carvedilol  6.25 mg Oral BID WC   fluticasone  1 spray Each Nare Daily   hydrochlorothiazide  25 mg Oral Daily   irbesartan  300 mg Oral Daily   lidocaine  1 patch Transdermal Q24H   loratadine  10 mg Oral Daily   rosuvastatin  5 mg Oral QHS   Continuous Infusions:  heparin     PRN Meds: acetaminophen, albuterol, diphenhydrAMINE, nitroGLYCERIN, ondansetron (ZOFRAN) IV, traMADol   Vital Signs    Vitals:   05/30/21 0300 05/30/21 0400 05/30/21 0459 05/30/21 0532  BP:  (!) 160/48 (!) 151/70 (!) 162/49  Pulse: (!) 49 (!) 50 (!) 55 (!) 56  Resp:  15 12 18   Temp:   97.6 F (36.4 C) 97.6 F (36.4 C)  TempSrc:   Oral Oral  SpO2:  98% 96% 99%  Weight:    91.2 kg  Height:    5\' 6"  (1.676 m)   No intake or output data in the 24 hours ending 05/30/21 0901 Last 3 Weights 05/30/2021 05/29/2021 03/21/2021  Weight (lbs) 201 lb 1 oz 191 lb 201 lb  Weight (kg) 91.2 kg 86.637 kg 91.173 kg      Telemetry    SR  - Personally Reviewed  ECG     - Personally Reviewed  Physical Exam   GEN: No acute distress.   Neck: No JVD Cardiac: RRR, no murmurs, Respiratory: Clear to auscultation bilaterally. GI: Soft, nontender, non-distended  MS: No edema; No deformity.  L knew sl swollen  Tender on medai aspect   Neuro:  Nonfocal  Psych: Normal affect   Labs    High Sensitivity Troponin:   Recent Labs  Lab 05/29/21 2337 05/30/21 0210  TROPONINIHS 242* 244*     Chemistry Recent Labs  Lab 05/29/21 2337  NA 137  K 4.4  CL 102  CO2 28  GLUCOSE 102*  BUN 26*  CREATININE 1.23*  CALCIUM 9.4  GFRNONAA 45*  ANIONGAP 7    Lipids No results for input(s): CHOL, TRIG, HDL, LABVLDL,  LDLCALC, CHOLHDL in the last 168 hours.  Hematology Recent Labs  Lab 05/29/21 2337  WBC 6.0  RBC 4.72  HGB 13.0  HCT 40.6  MCV 86.0  MCH 27.5  MCHC 32.0  RDW 13.5  PLT 207   Thyroid No results for input(s): TSH, FREET4 in the last 168 hours.  BNPNo results for input(s): BNP, PROBNP in the last 168 hours.  DDimer No results for input(s): DDIMER in the last 168 hours.   Radiology    DG Chest 2 View  Result Date: 05/30/2021 CLINICAL DATA:  Chest pain. EXAM: CHEST - 2 VIEW COMPARISON:  Chest radiograph dated 01/16/2021. FINDINGS: No focal consolidation, pleural effusion, pneumothorax. Mild cardiomegaly. Median sternotomy wires and left atrial appendage occlusive device. No acute osseous pathology. IMPRESSION: No active cardiopulmonary disease. Electronically Signed   By: Anner Crete M.D.   On: 05/30/2021 00:10   DG Knee Complete 4 Views Left  Result Date: 05/30/2021 CLINICAL DATA:  Left knee pain. EXAM: LEFT KNEE - COMPLETE 4+ VIEW COMPARISON:  None. FINDINGS: No  evidence of fracture, dislocation, or joint effusion. No evidence of severe arthropathy. No aggressive appearing focal bone abnormality. Suggestion of varicose veins. Otherwise soft tissues are unremarkable. IMPRESSION: No acute displaced fracture or dislocation. Electronically Signed   By: Iven Finn M.D.   On: 05/30/2021 00:42    Cardiac Studies    Echo 05/30/21  Left ventricular ejection fraction, by estimation, is 55 to 60%. The left ventricle has normal function. The left ventricle has no regional wall motion abnormalities. Left ventricular diastolic parameters were normal. 1. 2. Right ventricular systolic function is normal. The right ventricular size is normal. 3. Left atrial size was mildly dilated. 4. Right atrial size was mildly dilated. The mitral valve is abnormal. Mild mitral valve regurgitation. No evidence of mitral stenosis. 5. 6. Tricuspid valve regurgitation is moderate. The aortic valve  is normal in structure. Aortic valve regurgitation is not visualized. No aortic stenosis is present. 7. The inferior vena cava is normal in size with greater than 50% respiratory variability, suggesting right atrial pressure of 3 mmHg. TTE 11/28/20   1. Left ventricular ejection fraction, by estimation, is 55 to 60%. The left ventricle has normal function. The left ventricle demonstrates regional wall motion abnormalities (see scoring diagram/findings for description). Left ventricular diastolic parameters are consistent with Grade I diastolic dysfunction (impaired relaxation). 2. Right ventricular systolic function is normal. The right ventricular size is normal. There is normal pulmonary artery systolic pressure. The estimated right ventricular systolic pressure is 77.8 mmHg. 3. The mitral valve is grossly normal. Trivial mitral valve regurgitation. No evidence of mitral stenosis. 4. The aortic valve is tricuspid. Aortic valve regurgitation is not visualized. No aortic stenosis is present. 5. The inferior vena cava is normal in size with greater than 50% respiratory variability, suggesting right atrial pressure of 3 mmHg.   Left heart cath 11/28/2020: Prox LAD to Mid LAD lesion is 90% stenosed. 1st Diag lesion is 80% stenosed. 1st Mrg lesion is 90% stenosed. Mid Cx lesion is 100% stenosed. Prox RCA lesion is 30% stenosed. Prox RCA to Mid RCA lesion is 50% stenosed. LV end diastolic pressure is mildly elevated.   1. 3 vessel obstructive CAD    - 90% proximal to mid LAD, 80% first diagonal    - 90% large OM1, 100% LCX post OM1 with right to left collaterals.    - Patent stent in the proximal RCA. 50% mid vessel 2. Mildly elevated LVEDP  Patient Profile   Krystal Mcpherson is a 80 y.o. female with a PMHx of CAD s/p PCI to RCA 2012 and subsequent CABG (LIMA-LAD, left radial-OM and RI) 12/2020, hypertension, hyperlipidemia, DVT, bradycardia, postoperative atrial fibrillation, SVT  who is  being seen 05/30/2021 for the evaluation of left arm pain. Assessment & Plan    1  Elevated troponin  Pt presented with arm pain, chest discomfort   Intermittent over past week    Hx of CAD with CABG in June 2022   Symptoms are new    She is a difficult historian  Not feeling well   NO recent illness otherwise     Trop is elevated (200s), relatively flat Echo with normal LV and RV function   Review of cath from Spring 2022, the pt has  severe diffuse dz. ( ? If problem with graft to these vessels)    Reviewed with intervention service   Would recomm L heart cath to redefine anatomy   Risks/benefits described   patient understands and agrees to proceed  with this .  2  CAD   As noted above   with CABG in June 2022  3  L knee pain   The pt also has L knee pain   Did have DVT in May on R leg   Symptoms were different in leg at that time   And she is on Eliquis athome (heparin here)   I do not think DVT in L (with possible PE)  with anticoagulation  But, will check D Dimer  XRAy of knee is negatreive   4  HTN BP has been running high   She says started going up a few wks ago  She is on Benicar and HCTZ at home  Question low dose metoprolol      Carvedilol added here Consdier amldoipine on return    5 HL   Keep on statin     For questions or updates, please contact [facility] Please consult [email or web address] for contact info under        Signed, Dorris Carnes, MD  05/30/2021, 9:01 AM

## 2021-05-30 NOTE — H&P (Signed)
Cardiology Admission History and Physical:   Patient ID: Krystal Mcpherson MRN: [id number]; DOB: [birth year]   Admission date: 05/29/2021  PCP:  Krystal Loader, FNP   [facility] Providers Cardiologist:  Krystal Furbish, MD        Chief Complaint:  left arm Mcpherson   Patient Profile:   Krystal Mcpherson is a 80 y.o. female with a PMHx of CAD s/p PCI to RCA 2012 and subsequent CABG (LIMA-LAD, left radial-OM and RI) 12/2020, hypertension, hyperlipidemia, DVT, bradycardia, postoperative atrial fibrillation, SVT  who is being seen 05/30/2021 for the evaluation of left arm Mcpherson.  History of Present Illness:   Krystal Mcpherson reports continued and worsening left arm dull Mcpherson (left shoulder to left hand) since her CABG associated with left hand numbness and tinglings. There is aggravating or alleviating factors. She says the Mcpherson is not triggered by her position, movement or anything. It usually occurs at night and lasts for hours and resolves on its own. She also reports nauseous. She also reports her heart monitors showed "irregular heart beat" tonight. Patient reports her BP is elevated at home as well. Patient says she tried to contact Krystal Mcpherson in terms of her surgical rib Mcpherson and was told her surgeon has left. Denies fever, chills, dizziness, syncope, cough, chest discomfort, heart palpitations, dyspnea, bendopnea, orthopnea, PND, early satiety, upper abdominal Mcpherson, heartburn unrelated to meals, abdominal fullness, dysuria, diarrhea, pedal edema or any bleeding events.   On admission, K 4.4, Cr 1.23 (at her baseline), troponin 242 EKG@24 -Nov-2022 23:40:08 sinus rhythm, ST slightly depressed in lead III, TWI lead III  Past Medical History:  Diagnosis Date   Allergy    Anemia    Hx   Anginal Mcpherson (HCC)    Arthritis    neck, lower back   Asthma    Change in bowel habits    Chronic kidney disease    Complication of anesthesia    Coronary artery disease    Dyspnea    GERD (gastroesophageal  reflux disease)    HLD (hyperlipidemia)    hx - no med   HTN (hypertension)    Migraine    Myocardial infarction (HCC)    Pneumonia    PONV (postoperative nausea and vomiting)    Stented coronary artery    right, no problems   SVD (spontaneous vaginal delivery)    x 2   Vitamin B12 deficiency     Past Surgical History:  Procedure Laterality Date   bladder tact     chiara malformation  2001   surgery for decompression   CHOLECYSTECTOMY     CLIPPING OF ATRIAL APPENDAGE  12/04/2020   Procedure: CLIPPING OF LEFT ATRIAL APPENDAGE;  Surgeon: Krystal Olds, MD;  Location: [place];  Service: Open Heart Surgery;;   COLONOSCOPY  2009    [facility]   cornary stent Right 05/2011   CORONARY ARTERY BYPASS GRAFT N/A 12/04/2020   Procedure: CORONARY ARTERY BYPASS GRAFTING (CABG), ON PUMP, TIMES THREE, USING LEFT INTERNAL MAMMARY ARTERY AND LEFT RADIAL ARTERY;  Surgeon: Krystal Olds, MD;  Location: [place];  Service: Open Heart Surgery;  Laterality: N/A;   LEFT HEART CATH AND CORONARY ANGIOGRAPHY N/A 11/28/2020   Procedure: LEFT HEART CATH AND CORONARY ANGIOGRAPHY;  Surgeon: Mcpherson, Krystal M, MD;  Location: [place] [facility];  Service: Cardiovascular;  Laterality: N/A;   NASAL SINUS SURGERY     x 2   RADIAL ARTERY HARVEST Left 12/04/2020   Procedure:  Left RADIAL ARTERY HARVEST;  Surgeon: Krystal Olds, MD;  Location: [place];  Service: Open Heart Surgery;  Laterality: Left;   TEE WITHOUT CARDIOVERSION N/A 12/04/2020   Procedure: TRANSESOPHAGEAL ECHOCARDIOGRAM (TEE);  Surgeon: Krystal Olds, MD;  Location: [place];  Service: Open Heart Surgery;  Laterality: N/A;   TOTAL ABDOMINAL HYSTERECTOMY W/ BILATERAL SALPINGOOPHORECTOMY       Medications Prior to Admission: Prior to Admission medications   Medication Sig Start Date End Date Taking? Authorizing Provider  acetaminophen (TYLENOL) 500 MG tablet Take 500-1,000 mg by mouth every 6 (six) hours as needed for mild Mcpherson (Mcpherson).   Yes [provider]  albuterol (PROVENTIL HFA;VENTOLIN HFA) 108 (90 BASE) MCG/ACT inhaler Inhale 2 puffs into the lungs every 4 (four) hours as needed for wheezing or shortness of breath. Reported on 10/30/2015   Yes [provider]  apixaban (ELIQUIS) 5 MG TABS tablet Take 5 mg by mouth 2 (two) times daily.   Yes [provider]  Artificial Tear Ointment (DRY EYES OP) Place 1 drop into both eyes 2 (two) times daily as needed (dry eyes).   Yes [provider]  aspirin EC 81 MG EC tablet Take 1 tablet (81 mg total) by mouth daily. Swallow whole. 12/10/20  Yes Krystal Mcpherson  cetirizine (ZYRTEC) 10 MG tablet Take 10 mg by mouth daily.   Yes [provider]  diphenhydrAMINE (BENADRYL) 25 mg capsule Take 25-50 mg by mouth every 6 (six) hours as needed for itching. Reported on 10/30/2015   Yes [provider]  doxycycline (VIBRAMYCIN) 50 MG capsule Take 50 mg by mouth daily. Continuously 04/17/21  Yes [provider]  fluticasone (FLONASE) 50 MCG/ACT nasal spray Place 1 spray into both nostrils daily.   Yes [provider]  hydrochlorothiazide (HYDRODIURIL) 25 MG tablet Take 1 tablet (25 mg total) by mouth daily. 03/14/21 06/12/21 Yes Krystal Dubonnet, NP  ibuprofen (ADVIL) 200 MG tablet Take 200-400 mg by mouth every 6 (six) hours as needed for headache or moderate Mcpherson.   Yes [provider]  metoprolol tartrate (LOPRESSOR) 25 MG tablet Take 0.5 tablets (12.5 mg total) by mouth 2 (two) times daily. Patient taking differently: Take 25 mg by mouth 2 (two) times daily. 03/14/21  Yes Krystal Dubonnet, NP  metroNIDAZOLE (METROCREAM) 0.75 % cream Apply 1 application topically 2 (two) times daily. 03/11/21  Yes [provider]  Misc Natural Products (ELDERBERRY ZINC/VIT C/IMMUNE MT) Use as directed 1 tablet in the mouth or throat daily. gummy   Yes [provider]  olmesartan (BENICAR) 40 MG tablet Take 1 tablet (40 mg  total) by mouth daily. 04/28/21  Yes Krystal Pain, MD  rosuvastatin (CRESTOR) 5 MG tablet Take 1 tablet (5 mg total) by mouth daily. Patient taking differently: Take 5 mg by mouth at bedtime. 01/11/21  Yes Krystal Dubonnet, NP  traMADol (ULTRAM) 50 MG tablet Take 1 tablet (50 mg total) by mouth every 6 (six) hours as needed for severe Mcpherson (Mcpherson). Reported on 11/13/2015 Patient not taking: Reported on 05/30/2021 12/10/20   Nani Skillern, Mcpherson     Allergies:    Allergies  Allergen Reactions   Metronidazole Hives   Atorvastatin     Muscle Mcpherson    Ciprofloxacin Hives   Fentanyl Hives    Itching all over    Morphine And Related     Hallucinations    Tetanus Toxoids     Localized  knot    Augmentin [Amoxicillin-Pot Clavulanate] Nausea And Vomiting    Pt has taken Keflex (cephalexin) without adverse reaction. Started 08/06/16    Social History:   Social History   Socioeconomic History   Marital status: Widowed    Spouse name: Not on file   Number of children: Not on file   Years of education: Not on file   Highest education level: Not on file  Occupational History   Not on file  Tobacco Use   Smoking status: Never   Smokeless tobacco: Never  Vaping Use   Vaping Use: Never used  Substance and Sexual Activity   Alcohol use: No    Alcohol/week: 0.0 standard drinks   Drug use: No   Sexual activity: Never    Birth control/protection: Post-menopausal  Other Topics Concern   Not on file  Social History Narrative   Not on file   Social Determinants of Health   Financial Resource Strain: Not on file  Food Insecurity: Not on file  Transportation Needs: Not on file  Physical Activity: Not on file  Stress: Not on file  Social Connections: Not on file  Intimate Partner Violence: Not on file    Family History:   The patient's family history includes Arthritis in her sister; Breast cancer in her sister; Colon cancer in her paternal grandmother; Heart attack in her  father and mother; Hypertension in her father, mother, sister, sister, and sister; Kidney cancer in her sister; Melanoma in her sister; Obesity in her sister; Pancreatitis in her sister; Thyroid cancer in her sister. There is no history of Esophageal cancer, Rectal cancer, or Stomach cancer.    ROS:  Please see the history of present illness.  All other ROS reviewed and negative.     Physical Exam/Data:   Vitals:   05/30/21 0020 05/30/21 0030 05/30/21 0100 05/30/21 0130  BP: (!) 149/56 (!) 142/60 (!) 162/53 (!) 157/93  Pulse: (!) 58 (!) 56 (!) 53 (!) 55  Resp: 13 14 16 16   Temp:      TempSrc:      SpO2: 99% 99% 98% 99%  Weight:      Height:       No intake or output data in the 24 hours ending 05/30/21 0204 Last 3 Weights 05/29/2021 03/21/2021 03/14/2021  Weight (lbs) 191 lb 201 lb 200 lb 12.8 oz  Weight (kg) 86.637 kg 91.173 kg 91.082 kg     Body mass index is 30.83 kg/Mcpherson.  General:  Well nourished, well developed, in no acute distress HEENT: normal Neck: no JVD Vascular: No carotid bruits; Distal pulses 2+ bilaterally   Cardiac:  surgical scar healing well, normal S1, S2; RRR; no murmur  Lungs:  clear to auscultation bilaterally, no wheezing, rhonchi or rales  Abd: soft, nontender, no hepatomegaly  Ext: no edema Musculoskeletal:  No deformities, BUE and BLE strength normal and equal Skin: warm and dry  Neuro:  CNs 2-12 intact, no focal abnormalities noted Psych:  Normal affect    EKG:  The ECG that was done  was personally reviewed and demonstrates sinus rhythm  Relevant CV Studies: TTE 11/28/20  1. Left ventricular ejection fraction, by estimation, is 55 to 60%. The left ventricle has normal function. The left ventricle demonstrates regional wall motion abnormalities (see scoring diagram/findings for description). Left ventricular diastolic parameters are consistent with Grade I diastolic dysfunction (impaired relaxation). 2. Right ventricular systolic function is  normal. The right ventricular size is normal. There is normal  pulmonary artery systolic pressure. The estimated right ventricular systolic pressure is 97.6 mmHg. 3. The mitral valve is grossly normal. Trivial mitral valve regurgitation. No evidence of mitral stenosis. 4. The aortic valve is tricuspid. Aortic valve regurgitation is not visualized. No aortic stenosis is present. 5. The inferior vena cava is normal in size with greater than 50% respiratory variability, suggesting right atrial pressure of 3 mmHg.  Left heart cath 11/28/2020: Prox LAD to Mid LAD lesion is 90% stenosed. 1st Diag lesion is 80% stenosed. 1st Mrg lesion is 90% stenosed. Mid Cx lesion is 100% stenosed. Prox RCA lesion is 30% stenosed. Prox RCA to Mid RCA lesion is 50% stenosed. LV end diastolic pressure is mildly elevated.   1. 3 vessel obstructive CAD    - 90% proximal to mid LAD, 80% first diagonal    - 90% large OM1, 100% LCX post OM1 with right to left collaterals.    - Patent stent in the proximal RCA. 50% mid vessel 2. Mildly elevated LVEDP  Laboratory Data:  High Sensitivity Troponin:   Recent Labs  Lab 05/29/21 2337  TROPONINIHS 242*      Chemistry Recent Labs  Lab 05/29/21 2337  NA 137  K 4.4  CL 102  CO2 28  GLUCOSE 102*  BUN 26*  CREATININE 1.23*  CALCIUM 9.4  GFRNONAA 45*  ANIONGAP 7    No results for input(s): PROT, ALBUMIN, AST, ALT, ALKPHOS, BILITOT in the last 168 hours. Lipids No results for input(s): CHOL, TRIG, HDL, LABVLDL, LDLCALC, CHOLHDL in the last 168 hours. Hematology Recent Labs  Lab 05/29/21 2337  WBC 6.0  RBC 4.72  HGB 13.0  HCT 40.6  MCV 86.0  MCH 27.5  MCHC 32.0  RDW 13.5  PLT 207   Thyroid No results for input(s): TSH, FREET4 in the last 168 hours. BNPNo results for input(s): BNP, PROBNP in the last 168 hours.  DDimer No results for input(s): DDIMER in the last 168 hours.   Radiology/Studies:  DG Chest 2 View  Result Date:  05/30/2021 CLINICAL DATA:  Chest Mcpherson. EXAM: CHEST - 2 VIEW COMPARISON:  Chest radiograph dated 01/16/2021. FINDINGS: No focal consolidation, pleural effusion, pneumothorax. Mild cardiomegaly. Median sternotomy wires and left atrial appendage occlusive device. No acute osseous pathology. IMPRESSION: No active cardiopulmonary disease. Electronically Signed   By: Anner Crete Mcpherson.D.   On: 05/30/2021 00:10   DG Knee Complete 4 Views Left  Result Date: 05/30/2021 CLINICAL DATA:  Left knee Mcpherson. EXAM: LEFT KNEE - COMPLETE 4+ VIEW COMPARISON:  None. FINDINGS: No evidence of fracture, dislocation, or joint effusion. No evidence of severe arthropathy. No aggressive appearing focal bone abnormality. Suggestion of varicose veins. Otherwise soft tissues are unremarkable. IMPRESSION: No acute displaced fracture or dislocation. Electronically Signed   By: Iven Finn Mcpherson.D.   On: 05/30/2021 00:42     Assessment and Plan:   #Left arm Mcpherson associated numbness or tinglings #Elevated troponin in the setting of a recent CABG -Unclear etiology, however she presented with left arm Mcpherson associated with numbness or tinglings prior to her CABG -EKG on admission ST slightly depressed in lead III, TWI lead III appears to be similar compared with her EKG 06-01/2021 -continue to trend troponin -EKG prn -start heparin gtt  -continue ASA and rosuvastatin -Given her elevated BP, switch her metoprolol to coreg -acquire a TTE.  -if troponin remains flat and TTE no new changes, consider outpatient follow up -if unrevealing, may consider neurology consult in terms of  left arm Mcpherson/numbness/tinglings  #DVT on eliquis -DVT 10/2020 though multiple superficial DVTs in the past.  -Postoperative atrial fibrillation in setting of CABG with CHA2DS2-VASc Score = 5 -continue heparin gtt, and convert to eliquis upon discharge  #HTN -continue home HCTZ 25mg  daily and olmesartan 40mg  daily -start coreg 6.25mg  bid  Risk  Assessment/Risk Scores:    Severity of Illness: The appropriate patient status for this patient is OBSERVATION. Observation status is judged to be reasonable and necessary in order to provide the required intensity of service to ensure the patient's safety. The patient's presenting symptoms, physical exam findings, and initial radiographic and laboratory data in the context of their medical condition is felt to place them at decreased risk for further clinical deterioration. Furthermore, it is anticipated that the patient will be medically stable for discharge from the hospital within 2 midnights of admission.    For questions or updates, please contact [place] Please consult [email or web address] for contact info under     Signed, Laurice Record, MD  05/30/2021 2:04 AM

## 2021-05-31 ENCOUNTER — Encounter (HOSPITAL_COMMUNITY): Payer: Self-pay | Admitting: Internal Medicine

## 2021-05-31 DIAGNOSIS — I214 Non-ST elevation (NSTEMI) myocardial infarction: Secondary | ICD-10-CM | POA: Diagnosis not present

## 2021-05-31 DIAGNOSIS — N183 Chronic kidney disease, stage 3 unspecified: Secondary | ICD-10-CM

## 2021-05-31 DIAGNOSIS — Z6830 Body mass index (BMI) 30.0-30.9, adult: Secondary | ICD-10-CM | POA: Diagnosis not present

## 2021-05-31 DIAGNOSIS — M79602 Pain in left arm: Secondary | ICD-10-CM | POA: Diagnosis not present

## 2021-05-31 DIAGNOSIS — I2510 Atherosclerotic heart disease of native coronary artery without angina pectoris: Secondary | ICD-10-CM | POA: Diagnosis not present

## 2021-05-31 DIAGNOSIS — R778 Other specified abnormalities of plasma proteins: Secondary | ICD-10-CM | POA: Diagnosis not present

## 2021-05-31 DIAGNOSIS — E785 Hyperlipidemia, unspecified: Secondary | ICD-10-CM

## 2021-05-31 DIAGNOSIS — E669 Obesity, unspecified: Secondary | ICD-10-CM | POA: Diagnosis not present

## 2021-05-31 DIAGNOSIS — Z951 Presence of aortocoronary bypass graft: Secondary | ICD-10-CM | POA: Diagnosis not present

## 2021-05-31 DIAGNOSIS — I129 Hypertensive chronic kidney disease with stage 1 through stage 4 chronic kidney disease, or unspecified chronic kidney disease: Secondary | ICD-10-CM | POA: Diagnosis not present

## 2021-05-31 DIAGNOSIS — Z20822 Contact with and (suspected) exposure to covid-19: Secondary | ICD-10-CM | POA: Diagnosis not present

## 2021-05-31 LAB — CBC
HCT: 37.8 % (ref 36.0–46.0)
Hemoglobin: 12.2 g/dL (ref 12.0–15.0)
MCH: 27 pg (ref 26.0–34.0)
MCHC: 32.3 g/dL (ref 30.0–36.0)
MCV: 83.6 fL (ref 80.0–100.0)
Platelets: 178 10*3/uL (ref 150–400)
RBC: 4.52 MIL/uL (ref 3.87–5.11)
RDW: 13.8 % (ref 11.5–15.5)
WBC: 6.5 10*3/uL (ref 4.0–10.5)
nRBC: 0 % (ref 0.0–0.2)

## 2021-05-31 LAB — BASIC METABOLIC PANEL
Anion gap: 6 (ref 5–15)
BUN: 15 mg/dL (ref 8–23)
CO2: 26 mmol/L (ref 22–32)
Calcium: 9 mg/dL (ref 8.9–10.3)
Chloride: 106 mmol/L (ref 98–111)
Creatinine, Ser: 1.03 mg/dL — ABNORMAL HIGH (ref 0.44–1.00)
GFR, Estimated: 56 mL/min — ABNORMAL LOW (ref >60)
Glucose, Bld: 115 mg/dL — ABNORMAL HIGH (ref 70–99)
Potassium: 3.8 mmol/L (ref 3.5–5.1)
Sodium: 138 mmol/L (ref 135–145)

## 2021-05-31 LAB — POCT ACTIVATED CLOTTING TIME: Activated Clotting Time: 161 seconds

## 2021-05-31 MED ORDER — CLOPIDOGREL BISULFATE 75 MG PO TABS: 75.0000 mg | ORAL_TABLET | Freq: Every day | ORAL | 5 refills | Status: DC

## 2021-05-31 MED ORDER — NITROGLYCERIN 0.4 MG SL SUBL: 0.4000 mg | SUBLINGUAL_TABLET | SUBLINGUAL | 3 refills | Status: AC | PRN

## 2021-05-31 MED ORDER — ROSUVASTATIN CALCIUM 20 MG PO TABS: 20.0000 mg | ORAL_TABLET | Freq: Every day | ORAL | 5 refills | Status: DC

## 2021-05-31 MED ORDER — CARVEDILOL 6.25 MG PO TABS: 6.2500 mg | ORAL_TABLET | Freq: Two times a day (BID) | ORAL | 6 refills | Status: DC

## 2021-05-31 MED ORDER — ASPIRIN 81 MG PO TBEC: DELAYED_RELEASE_TABLET | ORAL | 0 refills | Status: AC

## 2021-05-31 NOTE — Progress Notes (Signed)
Right femoral sheath removed per order. Pressure held for 20 minutes. Site is a level 0. BP checked every 3 minutes throughout pull. Vital signs stable. Palpable pulses distal to site. Patient educated on signs and symptoms of RP bleed and to let nurse know if there are any questions.

## 2021-05-31 NOTE — Discharge Summary (Signed)
Discharge Summary    Patient ID: Krystal Mcpherson MRN: [id number]; DOB: 07-28-1940  Admit date: 05/29/2021 Discharge date: 05/31/2021  PCP:  Kristen Loader, FNP   [facility] Providers Cardiologist:  Candee Furbish, MD        Discharge Diagnoses    Principal Problem:   NSTEMI (non-ST elevated myocardial infarction) ([place])  *s/p PCI/DES to the RCA this admission Active Problems:   S/P CABG x 3   Coronary artery disease   Essential hypertension, benign   Obesity   CKD (chronic kidney disease), stage III ([place])   HLD (hyperlipidemia)  Diagnostic Studies/Procedures    2D Echocardiogram 11.25.2022   1. Left ventricular ejection fraction, by estimation, is 55 to 60%. The  left ventricle has normal function. The left ventricle has no regional  wall motion abnormalities. Left ventricular diastolic parameters were  normal.   2. Right ventricular systolic function is normal. The right ventricular  size is normal.   3. Left atrial size was mildly dilated.   4. Right atrial size was mildly dilated.   5. The mitral valve is abnormal. Mild mitral valve regurgitation. No  evidence of mitral stenosis.   6. Tricuspid valve regurgitation is moderate.   7. The aortic valve is normal in structure. Aortic valve regurgitation is  not visualized. No aortic stenosis is present.   8. The inferior vena cava is normal in size with greater than 50%  respiratory variability, suggesting right atrial pressure of 3 mmHg.  _____________   Cardiac Catheterization and Percutaneous Coronary Intervention 11.25.2022  Left Main  Dist LM to Prox LAD lesion is 95% stenosed.  Left Anterior Descending  Prox LAD to Mid LAD lesion is 100% stenosed.  First Diagonal Branch  Vessel is small in size.  1st Diag lesion is 90% stenosed.  Left Circumflex  Prox Cx lesion is 70% stenosed.  Prox Cx to Mid Cx lesion is 100% stenosed.  Right Coronary Artery  Ost RCA lesion is 10% stenosed.  Prox RCA lesion is 50%  stenosed. The lesion was previously treated .  Mid RCA-1 lesion is 95% stenosed.  Mid RCA-2 lesion is 40% stenosed.      **The proximal and mid RCA was successfully treated with a 3.0 x 34 mm Onyx frontier drug-eluting stent**  Patent LIMA graft supplying the mid LAD.  Patent left radial artery graft supplying a reported ramus intermediate and distal circumflex marginal branch.  History of Present Illness     Krystal Mcpherson is a 80 y.o. female with a history of CAD status post CABG x3 in June 2022, hypertension, hyperlipidemia, postoperative atrial fibrillation and SVT, postoperative DVT currently on Eliquis, sinus bradycardia, stage III chronic kidney disease, and obesity.  Krystal Mcpherson presented to the [facility] emergency department on November 24 secondary to progressive left arm and shoulder pain associated with left hand paresthesias.  She also reported palpitations.  In the emergency department, she was noted to have elevated troponin at 242.  ECG showed sinus rhythm with improved ST depression and T wave inversion in lead III.  She was placed on heparin therapy (home dose of Eliquis held), and admitted for further evaluation.  Hospital Course     Consultants: None  Troponin trend remained flat at 244  242.  She had no further arm pain following admission.  Echocardiogram showed normal LV function without regional wall motion abnormalities.  Mild MR was noted.  Given recent bypass surgery, angina, and mild troponin elevation, decision was made  to pursue diagnostic catheterization.  On November 25, she underwent diagnostic catheterization revealing 3 of 3 patent grafts with 50% in-stent restenosis in the proximal RCA and new, serial 95% and 40% stenoses in the mid RCA.  She otherwise had known occlusive multivessel disease as outlined above.  She underwent successful PCI and drug-eluting stent placement to the proximal and mid RCA with placement of a 3.0 x 34 mm Onyx frontier drug-eluting stent.  She  tolerated the procedure well and postprocedure, was monitored on cardiac progressive care.  Krystal Mcpherson has been ambulating without recurrent symptoms or limitations and will be discharged home today in good condition.  At discharge, we will plan aspirin, Plavix, and Eliquis x30 days with discontinuation of aspirin at that time and subsequent continuation of Plavix and Eliquis.  Did the patient have an acute coronary syndrome (MI, NSTEMI, STEMI, etc) this admission?:  Yes                               AHA/ACC Clinical Performance & Quality Measures: Aspirin prescribed? - Yes ADP Receptor Inhibitor (Plavix/Clopidogrel, Brilinta/Ticagrelor or Effient/Prasugrel) prescribed (includes medically managed patients)? - Yes Beta Blocker prescribed? - Yes High Intensity Statin (Lipitor 40-80mg  or Crestor 20-40mg ) prescribed? - Yes EF assessed during THIS hospitalization? - Yes For EF <40%, was ACEI/ARB prescribed? - Not Applicable (EF >/= 92%) For EF <40%, Aldosterone Antagonist (Spironolactone or Eplerenone) prescribed? - Not Applicable (EF >/= 33%) Cardiac Rehab Phase II ordered (including medically managed patients)? - Yes    The patient will be scheduled for a TOC follow up appointment in 7 days.  A message has been sent to the [facility] and Scheduling Pool at the office where the patient should be seen for follow up.  _____________  Discharge Vitals Blood pressure (!) 131/47, pulse 64, temperature 98.2 F (36.8 C), temperature source Oral, resp. rate 17, height 5\' 6"  (1.676 m), weight 91.2 kg, SpO2 94 %.  Filed Weights   05/29/21 2333 05/30/21 0532  Weight: 86.6 kg 91.2 kg    Labs & Radiologic Studies    CBC Recent Labs    05/29/21 2337 05/31/21 0232  WBC 6.0 6.5  HGB 13.0 12.2  HCT 40.6 37.8  MCV 86.0 83.6  PLT 207 007   Basic Metabolic Panel Recent Labs    05/29/21 2337 05/31/21 0232  NA 137 138  K 4.4 3.8  CL 102 106  CO2 28 26  GLUCOSE 102* 115*  BUN 26* 15  CREATININE  1.23* 1.03*  CALCIUM 9.4 9.0   High Sensitivity Troponin:   Recent Labs  Lab 05/29/21 2337 05/30/21 0210  TROPONINIHS 242* 244*   _____________  DG Chest 2 View  Result Date: 05/30/2021 CLINICAL DATA:  Chest pain. EXAM: CHEST - 2 VIEW COMPARISON:  Chest radiograph dated 01/16/2021. FINDINGS: No focal consolidation, pleural effusion, pneumothorax. Mild cardiomegaly. Median sternotomy wires and left atrial appendage occlusive device. No acute osseous pathology. IMPRESSION: No active cardiopulmonary disease. Electronically Signed   By: Anner Crete M.D.   On: 05/30/2021 00:10   Disposition   Pt is being discharged home today in good condition.  Follow-up Plans & Appointments     Follow-up Information     Jerline Pain, MD Follow up.   Specialty: Cardiology Why: We will arrange for follow-up with Dr. Marlou Porch or one of his associates within the next 7 to 14 days, and contact you. Contact information: [address] [place] [address] [phone]                Discharge Instructions     AMB Referral to Phase II Cardiac Rehab   Complete by: As directed    Diagnosis:  Coronary Stents NSTEMI     After initial evaluation and assessments completed: Virtual Based Care may be provided alone or in conjunction with Phase 2 Cardiac Rehab based on patient barriers.: Yes   Call MD for:  difficulty breathing, headache or visual disturbances   Complete by: As directed    Call MD for:  redness, tenderness, or signs of infection (pain, swelling, redness, odor or green/yellow discharge around incision site)   Complete by: As directed    Call MD for:  severe uncontrolled pain   Complete by: As directed    Call MD for:  temperature >100.4   Complete by: As directed    Diet - low sodium heart healthy   Complete by: As directed    Increase activity slowly   Complete by: As directed        Discharge Medications   Allergies as of 05/31/2021        Reactions   Metronidazole Hives   Atorvastatin    Muscle pain   Ciprofloxacin Hives   Fentanyl Hives   Itching all over   Morphine And Related    Hallucinations   Tetanus Toxoids    Localized knot   Augmentin [amoxicillin-pot Clavulanate] Nausea And Vomiting   Pt has taken Keflex (cephalexin) without adverse reaction. Started 08/06/16        Medication List     STOP taking these medications    doxycycline 50 MG capsule Commonly known as: VIBRAMYCIN   ELDERBERRY ZINC/VIT C/IMMUNE MT   ibuprofen 200 MG tablet Commonly known as: ADVIL   metoprolol tartrate 25 MG tablet Commonly known as: LOPRESSOR   metroNIDAZOLE 0.75 % cream Commonly known as: METROCREAM       TAKE these medications    acetaminophen 500 MG tablet Commonly known as: TYLENOL Take 500-1,000 mg by mouth every 6 (six) hours as needed for mild pain (pain).   albuterol 108 (90 Base) MCG/ACT inhaler Commonly known as: VENTOLIN HFA Inhale 2 puffs into the lungs every 4 (four) hours as needed for wheezing or shortness of breath. Reported on 10/30/2015   aspirin 81 MG EC tablet 1 tablet daily x 30 days. What changed:  how much to take how to take this when to take this additional instructions   carvedilol 6.25 MG tablet Commonly known as: COREG Take 1 tablet (6.25 mg total) by mouth 2 (two) times daily with a meal.   cetirizine 10 MG tablet Commonly known as: ZYRTEC Take 10 mg by mouth daily.   clopidogrel 75 MG tablet Commonly known as: PLAVIX Take 1 tablet (75 mg total) by mouth daily with breakfast.   diphenhydrAMINE 25 mg capsule Commonly known as: BENADRYL Take 25-50 mg by mouth every 6 (six) hours as needed for itching. Reported on 10/30/2015   DRY EYES OP Place 1 drop into both eyes 2 (two) times daily as needed (dry eyes).   Eliquis 5 MG Tabs tablet Generic drug: apixaban Take 5 mg by mouth 2 (two) times daily.   fluticasone 50 MCG/ACT nasal spray Commonly known as:  FLONASE Place 1 spray into both nostrils daily.   hydrochlorothiazide 25 MG tablet Commonly known as: HYDRODIURIL Take 1 tablet (25 mg total) by mouth daily.  nitroGLYCERIN 0.4 MG SL tablet Commonly known as: NITROSTAT Place 1 tablet (0.4 mg total) under the tongue every 5 (five) minutes x 3 doses as needed for chest pain.   olmesartan 40 MG tablet Commonly known as: BENICAR Take 1 tablet (40 mg total) by mouth daily.   rosuvastatin 20 MG tablet Commonly known as: CRESTOR Take 1 tablet (20 mg total) by mouth at bedtime. What changed:  medication strength how much to take when to take this   traMADol 50 MG tablet Commonly known as: ULTRAM Take 1 tablet (50 mg total) by mouth every 6 (six) hours as needed for severe pain (pain). Reported on 11/13/2015         Outstanding Labs/Studies   Follow-up lipids and LFTs in 6 to 8 weeks in the setting of escalation of statin therapy.  Duration of Discharge Encounter   Greater than 30 minutes including physician time.  Signed, Murray Hodgkins, NP 05/31/2021, 8:41 AM

## 2021-05-31 NOTE — Discharge Instructions (Addendum)
**PLEASE REMEMBER TO BRING ALL OF YOUR MEDICATIONS TO EACH OF YOUR FOLLOW-UP OFFICE VISITS.  NO HEAVY LIFTING OR SEXUAL ACTIVITY X 7 DAYS. NO DRIVING X 3-5 DAYS. NO SOAKING BATHS, HOT TUBS, POOLS, ETC., X 7 DAYS.   Groin Site Care Refer to this sheet in the next few weeks. These instructions provide you with information on caring for yourself after your procedure. Your caregiver may also give you more specific instructions. Your treatment has been planned according to current medical practices, but problems sometimes occur. Call your caregiver if you have any problems or questions after your procedure. HOME CARE INSTRUCTIONS You may shower 24 hours after the procedure. Remove the bandage (dressing) and gently wash the site with plain soap and water. Gently pat the site dry.  Do not apply powder or lotion to the site.  Do not sit in a bathtub, swimming pool, or whirlpool for 5 to 7 days.  No bending, squatting, or lifting anything over 10 pounds (4.5 kg) as directed by your caregiver.  Inspect the site at least twice daily.  Do not drive home if you are discharged the same day of the procedure. Have someone else drive you.   What to expect: Any bruising will usually fade within 1 to 2 weeks.  Blood that collects in the tissue (hematoma) may be painful to the touch. It should usually decrease in size and tenderness within 1 to 2 weeks.  SEEK IMMEDIATE MEDICAL CARE IF: You have unusual pain at the groin site or down the affected leg.  You have redness, warmth, swelling, or pain at the groin site.  You have drainage (other than a small amount of blood on the dressing).  You have chills.  You have a fever or persistent symptoms for more than 72 hours.  You have a fever and your symptoms suddenly get worse.  Your leg becomes pale, cool, tingly, or numb.  You have heavy bleeding from the site. Hold pressure on the site.  _____________    Dennis Bast have received care from [facility] during this hospital stay and we look forward to continuing to provide you with excellent care in our office settings after you've left the hospital.  In order to assure a smoother transition to home following your discharge from the hospital, we will likely have you see one of our nurse practitioners or physician assistants within a few weeks of discharge.  Our advanced practice providers work closely with your physician in order to address all of your heart's needs in a timely manner.  More information about all of our providers may be found here: [email or web address]  Please plan to bring all of your prescriptions to your follow-up appointment and don't hesitate to contact us with questions or concerns.  [place] [phone] - [place] [phone] [facility] - [phone] [place] - [phone] [facility] [place] [phone] [place] [phone] _____________    10 Habits of Highly Healthy [place] wants to help you get well and stay well.  Live a longer, healthier life by practicing healthy habits every day.  1.  Visit your primary care provider regularly. 2.  Make time for family and friends.  Healthy relationships are important. 3.  Take medications as directed by your provider. 4.  Maintain a healthy weight and a trim waistline.  5.  Eat healthy meals and snacks, rich in fruits, vegetables, whole grains, and lean proteins. 6.  Get moving every day - aim for 150 minutes of moderate physical activity each week. 7.  Don't smoke. 8.  Avoid alcohol or drink in moderation. 9.  Manage stress through meditation or mindful relaxation. 10.  Get seven to nine hours of quality sleep each night.  Want more  information on healthy habits?  To learn more about these and other healthy habits, visit [email or web address].[email or web address]. _____________

## 2021-05-31 NOTE — Progress Notes (Signed)
CARDIAC REHAB PHASE I   PRE:  Rate/Rhythm: Sinus Rhythm 65  BP:    Sitting: 129/49     SaO2: 98% RA  MODE:  Ambulation: 450 ft   POST:  Rate/Rhythem: Sinus 91  BP:  Supine: 123/80       SaO2: 98% on room air [phone] Patient ambulated independently in the hallway without complaints or shortness of breath. Patient assisted back to bed. Tolerated well. Reviewed exercise instructions/ temperature precautions, end points of exercise. Krystal Mcpherson did not participate in phase 2 cardiac rehab after her OHS but says she is interested in participating in [place] now. Reviewed MI booklet, heart healthy diet with the patient. Discussed risk factors, importance of taking Plavix and use of sublingual nitroglycerin and when to call 911.      Harrell Gave RN

## 2021-06-02 ENCOUNTER — Telehealth: Payer: Self-pay

## 2021-06-02 ENCOUNTER — Encounter (HOSPITAL_COMMUNITY): Payer: Self-pay | Admitting: Cardiovascular Disease

## 2021-06-02 NOTE — Telephone Encounter (Signed)
**Note De-identified Truly Stankiewicz Obfuscation** -----  **Note De-Identified Annette Liotta Obfuscation** Message from Theora Gianotti, NP sent at 05/31/2021  8:53 AM EST ----- Good morning,  This patient is status post non-STEMI and stenting and will require a 7-day transition of care follow-up appointment w/ skains or app.  Separate message sent to scheduling.  Thanks,  Gerald Stabs

## 2021-06-02 NOTE — Telephone Encounter (Signed)
**Note De-Identified Maddux Vanscyoc Obfuscation** Patient contacted regarding discharge from [facility] on 05/31/2021.  Patient understands that a member of our scheduling team will be contacting her to schedule her post hospital f/u.  Patient understands discharge instructions? Yes Patient understands medications and regiment? Yes.  The pt states that she cannot tolerate a jump in her Rosuvastatin dose from 5 mg to 20 mg daily as she states that 5 mg is as high a dose as she wants to take due to myalgias.  She is aware that I am forwarding this note to Dr Kingsley Plan and that someone will be contacting her with his recommendation.   Patient understands to bring all medications to this visit? Yes  Ask patient:  Are you enrolled in My Chart: Yes  The pt states that she feels "great" at this time and is without complaint of CP, SOB, weakness, fatigue, dizziness, nausea, or, diaphoresis. She does have [facility]'s phone number to call if she has any questions or concerns.

## 2021-06-03 NOTE — Telephone Encounter (Signed)
Pt has appt with [facility] 12/1.  Will discuss lipid clinic appt needs at that time.

## 2021-06-03 NOTE — Telephone Encounter (Signed)
Thank you for the update.  Lets have her sit down with pharmacy team to start PCSK9 inhibitor since she can only maximally tolerate Crestor 5 mg.  This is very important to help reduce risks of future heart attack.  Candee Furbish, MD

## 2021-06-05 ENCOUNTER — Encounter: Payer: Self-pay | Admitting: Cardiology

## 2021-06-05 ENCOUNTER — Other Ambulatory Visit: Payer: Self-pay

## 2021-06-05 ENCOUNTER — Ambulatory Visit: Payer: Medicare PPO | Admitting: Cardiology

## 2021-06-05 DIAGNOSIS — I10 Essential (primary) hypertension: Secondary | ICD-10-CM

## 2021-06-05 DIAGNOSIS — I208 Other forms of angina pectoris: Secondary | ICD-10-CM | POA: Diagnosis not present

## 2021-06-05 DIAGNOSIS — I480 Paroxysmal atrial fibrillation: Secondary | ICD-10-CM

## 2021-06-05 DIAGNOSIS — Z86718 Personal history of other venous thrombosis and embolism: Secondary | ICD-10-CM

## 2021-06-05 DIAGNOSIS — I2510 Atherosclerotic heart disease of native coronary artery without angina pectoris: Secondary | ICD-10-CM

## 2021-06-05 DIAGNOSIS — E7800 Pure hypercholesterolemia, unspecified: Secondary | ICD-10-CM

## 2021-06-05 MED ORDER — ROSUVASTATIN CALCIUM 5 MG PO TABS: 5.0000 mg | ORAL_TABLET | Freq: Every day | ORAL | 3 refills | Status: DC

## 2021-06-05 NOTE — Assessment & Plan Note (Signed)
Treated with stent.  Bypass.

## 2021-06-05 NOTE — Assessment & Plan Note (Signed)
LDL goal less than 55 with recurrence of coronary disease post bypass.  She is only able to tolerate maximally Crestor 5 mg once a day.  After this she started develop more myalgias.  I will have her sit down with the lipid clinic to discuss further options such as PCSK9 inhibitor.  She does not wish to go from 5 mg up to 20 mg on the Crestor.

## 2021-06-05 NOTE — Assessment & Plan Note (Signed)
Recent RCA stent placed 34 mm drug-eluting stent Dr. Claiborne Billings.  Continue with instructions for aspirin 81 mg for 1 month, Plavix 75 mg for 6 months then continued Eliquis as well.  She is doing well without any chest pain.

## 2021-06-05 NOTE — Patient Instructions (Signed)
Medication Instructions:  Please decrease Crestor to 5 mg a day. Continue all other medications as listed.  *If you need a refill on your cardiac medications before your next appointment, please call your pharmacy*  You have been referred to see our pharmacy team to discuss treatment options for your cholesterol.  Follow-Up: At [facility], you and your health needs are our priority.  As part of our continuing mission to provide you with exceptional heart care, we have created designated Provider Care Teams.  These Care Teams include your primary Cardiologist (physician) and Advanced Practice Providers (APPs -  Physician Assistants and Nurse Practitioners) who all work together to provide you with the care you need, when you need it.  We recommend signing up for the patient portal called "MyChart".  Sign up information is provided on this After Visit Summary.  MyChart is used to connect with patients for Virtual Visits (Telemedicine).  Patients are able to view lab/test results, encounter notes, upcoming appointments, etc.  Non-urgent messages can be sent to your provider as well.   To learn more about what you can do with MyChart, go to [email or web address].    Your next appointment:   4 month(s)  The format for your next appointment:   In Person  Provider:   Robbie Lis, PA-C, Melina Copa, PA-C, Cecilie Kicks, NP, Ermalinda Barrios, PA-C, Christen Bame, NP, or Richardson Dopp, PA-C         Thank you for choosing [facility]!!

## 2021-06-05 NOTE — Assessment & Plan Note (Signed)
Currently on Eliquis

## 2021-06-05 NOTE — Assessment & Plan Note (Signed)
Blood pressure was difficult to control.  Her olmesartan is back up to 40 mg.  Today it was excellent.  Continuing with carvedilol as well.  She used to be on atenolol for many years in the past.

## 2021-06-05 NOTE — Progress Notes (Signed)
Cardiology Office Note:    Date:  06/05/2021   ID:  Krystal Mcpherson, DOB Nov 21, 1940, MRN [id number]  PCP:  Kristen Loader, FNP  [facility] Cardiologist:  Candee Furbish, MD  [facility] Electrophysiologist:  None   Referring MD: Kristen Loader, FNP     History of Present Illness:    Krystal Mcpherson is a 80 y.o. female here for the follow-up of coronary artery disease status post CABG, left atrial appendage clipping (but no Maze procedure) by Dr. Orvan Seen in June with subsequent recent stent placement in the setting of angina in RCA by Dr. Claiborne Billings, now on triple therapy, Eliquis for prior DVT, myalgia and PCSK9, and lipid clinic visit. She has a Hx of arthritis, asthma.   Today,   She overall appears well, and had a 3 mm wide and 33 mm long stent placed previously, and had a DVT recently this year before her MI. She also has an left atrial clip.   With an onset 2 months ago, she experienced HTN and with systolic pressures as high as 139-140. She bought a new blood pressure monitor that syncs with her phone and it tells her when her heart is "skipping."   She had some chest pain and left arm pain during thanksgiving and had to be treated for a heart attack after going to the hospital. Her heart rate was low (around 55) while she was at the hospital had to stay for two weeks.  She has been compliant with her 30 day Asprin, Plavix, and Eliquis.   Her diet does not consist of fried foods; she eats fresh and frozen foods. She also exercises by walking and does activities such as yard work.  She denies any palpitations, chest pain, or shortness of breath. No lightheadedness, headaches, syncope, orthopnea, PND, lower extremity edema or exertional symptoms.  Past Medical History:  Diagnosis Date   Allergy    Anemia    Hx   Arthritis    neck, lower back   Asthma    Change in bowel habits    CKD (chronic kidney disease), stage III ([place])    Complication of anesthesia    Coronary artery  disease    a. 2012 s/p PCI ->RCA; b. 12/2020 CABG x 3 (LIMA->LAD, L Radial->RI->OM); c. 05/2021 NSTEMI/PCI: LM 95, LAD 100p/m, D1 90, LCX 70p, 100p/m, RCA 10ost, 50p ISR, 95/57m (p/m RCA treated w/ 3.0x34 Onyx Frontier DES), 3/3 patent grafts.   Dyspnea    GERD (gastroesophageal reflux disease)    HLD (hyperlipidemia)    HTN (hypertension)    Migraine    Myocardial infarction (HCC)    Pneumonia    PONV (postoperative nausea and vomiting)    Post-operative atrial fibrillation ([place])    a. 12/2020 follwing CABG.   SVD (spontaneous vaginal delivery)    x 2   Vitamin B12 deficiency     Past Surgical History:  Procedure Laterality Date   bladder tact     chiara malformation  2001   surgery for decompression   CHOLECYSTECTOMY     CLIPPING OF ATRIAL APPENDAGE  12/04/2020   Procedure: CLIPPING OF LEFT ATRIAL APPENDAGE;  Surgeon: Wonda Olds, MD;  Location: [place] [facility];  Service: Open Heart Surgery;;   COLONOSCOPY  2009    stark   cornary stent Right 05/2011   CORONARY ARTERY BYPASS GRAFT N/A 12/04/2020   Procedure: CORONARY ARTERY BYPASS GRAFTING (CABG), ON PUMP, TIMES THREE, USING LEFT INTERNAL MAMMARY ARTERY AND LEFT  RADIAL ARTERY;  Surgeon: Wonda Olds, MD;  Location: [place];  Service: Open Heart Surgery;  Laterality: N/A;   CORONARY STENT INTERVENTION N/A 05/30/2021   Procedure: CORONARY STENT INTERVENTION;  Surgeon: Troy Sine, MD;  Location: [place] [facility];  Service: Cardiovascular;  Laterality: N/A;   LEFT HEART CATH AND CORONARY ANGIOGRAPHY N/A 11/28/2020   Procedure: LEFT HEART CATH AND CORONARY ANGIOGRAPHY;  Surgeon: Martinique, Peter M, MD;  Location: [place] [facility];  Service: Cardiovascular;  Laterality: N/A;   LEFT HEART CATH AND CORS/GRAFTS ANGIOGRAPHY N/A 05/30/2021   Procedure: LEFT HEART CATH AND CORS/GRAFTS ANGIOGRAPHY;  Surgeon: Troy Sine, MD;  Location: [place] [facility];  Service: Cardiovascular;  Laterality: N/A;   NASAL SINUS SURGERY     x 2   RADIAL  ARTERY HARVEST Left 12/04/2020   Procedure: Left RADIAL ARTERY HARVEST;  Surgeon: Wonda Olds, MD;  Location: [place];  Service: Open Heart Surgery;  Laterality: Left;   TEE WITHOUT CARDIOVERSION N/A 12/04/2020   Procedure: TRANSESOPHAGEAL ECHOCARDIOGRAM (TEE);  Surgeon: Wonda Olds, MD;  Location: [place];  Service: Open Heart Surgery;  Laterality: N/A;   TOTAL ABDOMINAL HYSTERECTOMY W/ BILATERAL SALPINGOOPHORECTOMY      Current Medications: Current Meds  Medication Sig   acetaminophen (TYLENOL) 500 MG tablet Take 500-1,000 mg by mouth every 6 (six) hours as needed for mild pain (pain).   albuterol (PROVENTIL HFA;VENTOLIN HFA) 108 (90 BASE) MCG/ACT inhaler Inhale 2 puffs into the lungs every 4 (four) hours as needed for wheezing or shortness of breath. Reported on 10/30/2015   apixaban (ELIQUIS) 5 MG TABS tablet Take 5 mg by mouth 2 (two) times daily.   Artificial Tear Ointment (DRY EYES OP) Place 1 drop into both eyes 2 (two) times daily as needed (dry eyes).   aspirin 81 MG EC tablet 1 tablet daily x 30 days.   carvedilol (COREG) 6.25 MG tablet Take 1 tablet (6.25 mg total) by mouth 2 (two) times daily with a meal.   cetirizine (ZYRTEC) 10 MG tablet Take 10 mg by mouth daily.   clopidogrel (PLAVIX) 75 MG tablet Take 1 tablet (75 mg total) by mouth daily with breakfast.   diphenhydrAMINE (BENADRYL) 25 mg capsule Take 25-50 mg by mouth every 6 (six) hours as needed for itching. Reported on 10/30/2015   fluticasone (FLONASE) 50 MCG/ACT nasal spray Place 1 spray into both nostrils daily.   hydrochlorothiazide (HYDRODIURIL) 25 MG tablet Take 1 tablet (25 mg total) by mouth daily.   nitroGLYCERIN (NITROSTAT) 0.4 MG SL tablet Place 1 tablet (0.4 mg total) under the tongue every 5 (five) minutes x 3 doses as needed for chest pain.   olmesartan (BENICAR) 40 MG tablet Take 1 tablet (40 mg total) by mouth daily.   rosuvastatin (CRESTOR) 5 MG tablet Take 1 tablet (5 mg total) by mouth daily.    traMADol (ULTRAM) 50 MG tablet Take 1 tablet (50 mg total) by mouth every 6 (six) hours as needed for severe pain (pain). Reported on 11/13/2015   [DISCONTINUED] rosuvastatin (CRESTOR) 20 MG tablet Take 1 tablet (20 mg total) by mouth at bedtime.     Allergies:   Metronidazole, Atorvastatin, Ciprofloxacin, Fentanyl, Morphine and related, Tetanus toxoids, and Augmentin [amoxicillin-pot clavulanate]   Social History   Socioeconomic History   Marital status: Widowed    Spouse name: Not on file   Number of children: Not on file   Years of education: Not on file  Highest education level: Not on file  Occupational History   Not on file  Tobacco Use   Smoking status: Never   Smokeless tobacco: Never  Vaping Use   Vaping Use: Never used  Substance and Sexual Activity   Alcohol use: No    Alcohol/week: 0.0 standard drinks   Drug use: No   Sexual activity: Never    Birth control/protection: Post-menopausal  Other Topics Concern   Not on file  Social History Narrative   Not on file   Social Determinants of Health   Financial Resource Strain: Not on file  Food Insecurity: Not on file  Transportation Needs: Not on file  Physical Activity: Not on file  Stress: Not on file  Social Connections: Not on file     Family History: The patient's family history includes Arthritis in her sister; Breast cancer in her sister; Colon cancer in her paternal grandmother; Heart attack in her father and mother; Hypertension in her father, mother, sister, sister, and sister; Kidney cancer in her sister; Melanoma in her sister; Obesity in her sister; Pancreatitis in her sister; Thyroid cancer in her sister. There is no history of Esophageal cancer, Rectal cancer, or Stomach cancer.    EKG:   05/24/2020: sinus bradycardia 55 no other changes. -ZIO monitor 11/21/2018: Brief, less than 5 beat runs of supraventricular tachycardia, fastest at 160 bpm, appears to be paroxysmal atrial tachycardia.  No  evidence of atrial fibrillation or flutter.  No significant pauses noted.  Occasionally symptoms of heart fluttering are associated with the short SVT lasting 4-5 beats.  Recent Labs: 12/05/2020: Magnesium 2.9 01/17/2021: ALT 7; TSH 4.060 05/31/2021: BUN 15; Creatinine, Ser 1.03; Hemoglobin 12.2; Platelets 178; Potassium 3.8; Sodium 138  Recent Lipid Panel    Component Value Date/Time   CHOL 139 01/17/2021 1152   TRIG 92 01/17/2021 1152   HDL 51 01/17/2021 1152   CHOLHDL 2.7 01/17/2021 1152   CHOLHDL 3.1 11/29/2020 0609   VLDL 11 11/29/2020 0609   LDLCALC 71 01/17/2021 1152     Physical Exam:    VS:  BP 120/60 (BP Location: Right Arm, Patient Position: Sitting, Cuff Size: Normal)   Pulse 62   Ht 5\' 6"  (1.676 m)   Wt 198 lb (89.8 kg)   SpO2 96%   BMI 31.96 kg/m     Wt Readings from Last 3 Encounters:  06/05/21 198 lb (89.8 kg)  05/30/21 201 lb 1 oz (91.2 kg)  03/21/21 201 lb (91.2 kg)     GEN:  Well nourished, well developed in no acute distress HEENT: Normal NECK: No JVD; No carotid bruits LYMPHATICS: No lymphadenopathy CARDIAC: RRR, no murmurs, rubs, gallops RESPIRATORY:  Clear to auscultation without rales, wheezing or rhonchi  ABDOMEN: Soft, non-tender, non-distended MUSCULOSKELETAL:  No edema; No deformity  SKIN: Warm and dry NEUROLOGIC:  Alert and oriented x 3 PSYCHIATRIC:  Normal affect   ASSESSMENT:    1. Atherosclerosis of native coronary artery of native heart without angina pectoris   2. Paroxysmal atrial fibrillation (HCC)   3. Angina decubitus ([place])   4. Essential hypertension, benign   5. History of DVT (deep vein thrombosis)   6. Pure hypercholesterolemia     PLAN:    Atherosclerosis of native coronary artery of native heart without angina pectoris Recent RCA stent placed 34 mm drug-eluting stent Dr. Claiborne Billings.  Continue with instructions for aspirin 81 mg for 1 month, Plavix 75 mg for 6 months then continued Eliquis as well.  She is  doing well  without any chest pain.  Paroxysmal atrial fibrillation (HCC) Brief episode of postoperative atrial fibrillation noted.  She was on amiodarone transiently postoperatively in June.  She has not had any further episodes.  She does feel an occasional skip.  She is on Eliquis.  This was primarily being utilized for prior DVT.  She does have a left atrial appendage clipping by Dr. Orvan Seen.  Angina decubitus Treated with stent.  Bypass.  Essential hypertension, benign Blood pressure was difficult to control.  Her olmesartan is back up to 40 mg.  Today it was excellent.  Continuing with carvedilol as well.  She used to be on atenolol for many years in the past.  History of DVT (deep vein thrombosis) Currently on Eliquis.  Pure hypercholesterolemia LDL goal less than 55 with recurrence of coronary disease post bypass.  She is only able to tolerate maximally Crestor 5 mg once a day.  After this she started develop more myalgias.  I will have her sit down with the lipid clinic to discuss further options such as PCSK9 inhibitor.  She does not wish to go from 5 mg up to 20 mg on the Crestor.   Extensive review of medical records prior cardiac catheterization cath report, op report, cath films.  F/U in 4 Month with APP  Medication Adjustments/Labs and Tests Ordered: Current medicines are reviewed at length with the patient today.  Concerns regarding medicines are outlined above.  Orders Placed This Encounter  Procedures   AMB Referral to [facility] Pharm-D    Meds ordered this encounter  Medications   rosuvastatin (CRESTOR) 5 MG tablet    Sig: Take 1 tablet (5 mg total) by mouth daily.    Dispense:  90 tablet    Refill:  3     Patient Instructions  Medication Instructions:  Please decrease Crestor to 5 mg a day. Continue all other medications as listed.  *If you need a refill on your cardiac medications before your next appointment, please call your pharmacy*  You have been referred to  see our pharmacy team to discuss treatment options for your cholesterol.  Follow-Up: At [facility], you and your health needs are our priority.  As part of our continuing mission to provide you with exceptional heart care, we have created designated Provider Care Teams.  These Care Teams include your primary Cardiologist (physician) and Advanced Practice Providers (APPs -  Physician Assistants and Nurse Practitioners) who all work together to provide you with the care you need, when you need it.  We recommend signing up for the patient portal called "MyChart".  Sign up information is provided on this After Visit Summary.  MyChart is used to connect with patients for Virtual Visits (Telemedicine).  Patients are able to view lab/test results, encounter notes, upcoming appointments, etc.  Non-urgent messages can be sent to your provider as well.   To learn more about what you can do with MyChart, go to [email or web address].    Your next appointment:   4 month(s)  The format for your next appointment:   In Person  Provider:   Robbie Lis, PA-C, Melina Copa, PA-C, Cecilie Kicks, NP, Ermalinda Barrios, PA-C, Christen Bame, NP, or Richardson Dopp, PA-C         Thank you for choosing [facility]!!      Rondell Reams as a scribe for Candee Furbish, MD.,have documented all relevant documentation on the behalf of Candee Furbish, MD,as directed by  Candee Furbish, MD while  in the presence of Candee Furbish, MD.   I, Candee Furbish, MD, have reviewed all documentation for this visit. The documentation on 06/05/21 for the exam, diagnosis, procedures, and orders are all accurate and complete.   Signed, Candee Furbish, MD  06/05/2021 2:34 PM    [place]

## 2021-06-05 NOTE — Assessment & Plan Note (Signed)
Brief episode of postoperative atrial fibrillation noted.  She was on amiodarone transiently postoperatively in June.  She has not had any further episodes.  She does feel an occasional skip.  She is on Eliquis.  This was primarily being utilized for prior DVT.  She does have a left atrial appendage clipping by Dr. Orvan Seen.

## 2021-06-10 DIAGNOSIS — I70203 Unspecified atherosclerosis of native arteries of extremities, bilateral legs: Secondary | ICD-10-CM | POA: Diagnosis not present

## 2021-06-10 DIAGNOSIS — L600 Ingrowing nail: Secondary | ICD-10-CM | POA: Diagnosis not present

## 2021-06-10 DIAGNOSIS — B351 Tinea unguium: Secondary | ICD-10-CM | POA: Diagnosis not present

## 2021-06-20 DIAGNOSIS — M25562 Pain in left knee: Secondary | ICD-10-CM | POA: Diagnosis not present

## 2021-06-25 DIAGNOSIS — R0981 Nasal congestion: Secondary | ICD-10-CM | POA: Diagnosis not present

## 2021-06-25 DIAGNOSIS — R051 Acute cough: Secondary | ICD-10-CM | POA: Diagnosis not present

## 2021-06-26 ENCOUNTER — Telehealth (HOSPITAL_COMMUNITY): Payer: Self-pay

## 2021-06-26 NOTE — Telephone Encounter (Signed)
Called patient to see if she was interested in participating in the Cardiac Rehab Program. Patient stated yes. Patient will come in for orientation on 07/22/2021@1 :15pm and will attend the 1:00pm exercise class.   Tourist information centre manager.

## 2021-07-03 ENCOUNTER — Ambulatory Visit: Payer: Medicare PPO

## 2021-07-09 NOTE — Telephone Encounter (Signed)
Pt insurance is active and benefits verified through [employer] $20, DED 0/0 met, out of pocket $4,000/0 met, co-insurance 0%. no pre-authorization required. Passport, 07/09/2021@11 :20am, REF# [id number]

## 2021-07-18 DIAGNOSIS — J069 Acute upper respiratory infection, unspecified: Secondary | ICD-10-CM | POA: Diagnosis not present

## 2021-07-21 ENCOUNTER — Telehealth (HOSPITAL_COMMUNITY): Payer: Self-pay

## 2021-07-22 ENCOUNTER — Inpatient Hospital Stay (HOSPITAL_COMMUNITY): Admission: RE | Admit: 2021-07-22 | Payer: Medicare PPO | Source: Ambulatory Visit

## 2021-07-22 ENCOUNTER — Ambulatory Visit: Payer: Medicare PPO

## 2021-07-24 DIAGNOSIS — J029 Acute pharyngitis, unspecified: Secondary | ICD-10-CM | POA: Diagnosis not present

## 2021-07-24 DIAGNOSIS — J0100 Acute maxillary sinusitis, unspecified: Secondary | ICD-10-CM | POA: Diagnosis not present

## 2021-07-25 ENCOUNTER — Ambulatory Visit: Payer: Medicare PPO | Admitting: Cardiology

## 2021-07-28 ENCOUNTER — Ambulatory Visit (HOSPITAL_COMMUNITY): Payer: Medicare PPO

## 2021-07-30 ENCOUNTER — Ambulatory Visit (HOSPITAL_COMMUNITY): Payer: Medicare PPO

## 2021-08-01 ENCOUNTER — Ambulatory Visit (HOSPITAL_COMMUNITY): Payer: Medicare PPO

## 2021-08-04 ENCOUNTER — Telehealth (HOSPITAL_COMMUNITY): Payer: Self-pay

## 2021-08-04 ENCOUNTER — Ambulatory Visit (HOSPITAL_COMMUNITY): Payer: Medicare PPO

## 2021-08-04 NOTE — Progress Notes (Signed)
Patient ID: Krystal Mcpherson                 DOB: 13-Mar-1941                    MRN: [id number]    HPI: Krystal Mcpherson is a 81 y.o. female patient referred to lipid clinic by Dr. Marlou Porch. PMH is significant for CAD s/p CABG 12/2020 then NSTEMI/PCI in 05/2021, left atrial appendage clipping, DVT, HTN, CKD. Able to tolerate rosuvastatin as high as 5 mg daily.   Today, patient arrives in good spirits. She is still tolerating rosuvastatin 5 mg daily. She is interested in starting PCSK9i.   Current Medications: rosuvastatin 5 mg daily Intolerances: atorvastatin (myalgias), rosuvastatin 10 mg or higher (myalgias) Risk Factors: ASCVD (CABG followed by NSTEMI), HTN, CKD LDL goal: <55 mg/dL  Diet: Does not eat fried foods or much red meat. Likes sweets and trying to cut back.   Exercise: Was not able to do cardiac rehab due to scheduling issues, planning to join a gym soon  Family History: Heart attack in her father and mother; Hypertension in her father, mother, sister, sister, and sister; Obesity in her sister; Pancreatitis in her sister; Thyroid cancer in her sister  Social History: Never smoker  Labs:  01/17/21: TC 139, TG 92, HDL 51, LDL 71 (rosuvastatin 5 mg daily)  Past Medical History:  Diagnosis Date   Allergy    Anemia    Hx   Arthritis    neck, lower back   Asthma    Change in bowel habits    CKD (chronic kidney disease), stage III ([place])    Complication of anesthesia    Coronary artery disease    a. 2012 s/p PCI ->RCA; b. 12/2020 CABG x 3 (LIMA->LAD, L Radial->RI->OM); c. 05/2021 NSTEMI/PCI: LM 95, LAD 100p/m, D1 90, LCX 70p, 100p/m, RCA 10ost, 50p ISR, 95/59m (p/m RCA treated w/ 3.0x34 Onyx Frontier DES), 3/3 patent grafts.   Dyspnea    GERD (gastroesophageal reflux disease)    HLD (hyperlipidemia)    HTN (hypertension)    Migraine    Myocardial infarction (HCC)    Pneumonia    PONV (postoperative nausea and vomiting)    Post-operative atrial fibrillation ([place])    a. 12/2020  follwing CABG.   SVD (spontaneous vaginal delivery)    x 2   Vitamin B12 deficiency     Current Outpatient Medications on File Prior to Visit  Medication Sig Dispense Refill   acetaminophen (TYLENOL) 500 MG tablet Take 500-1,000 mg by mouth every 6 (six) hours as needed for mild pain (pain).     albuterol (PROVENTIL HFA;VENTOLIN HFA) 108 (90 BASE) MCG/ACT inhaler Inhale 2 puffs into the lungs every 4 (four) hours as needed for wheezing or shortness of breath. Reported on 10/30/2015     apixaban (ELIQUIS) 5 MG TABS tablet Take 5 mg by mouth 2 (two) times daily.     Artificial Tear Ointment (DRY EYES OP) Place 1 drop into both eyes 2 (two) times daily as needed (dry eyes).     aspirin EC 81 MG tablet Take 81 mg by mouth in the morning. Swallow whole.     carvedilol (COREG) 6.25 MG tablet Take 1 tablet (6.25 mg total) by mouth 2 (two) times daily with a meal. 60 tablet 6   cetirizine (ZYRTEC) 10 MG tablet Take 10 mg by mouth in the morning.     clopidogrel (PLAVIX) 75 MG tablet Take 1  tablet (75 mg total) by mouth daily with breakfast. 30 tablet 5   diphenhydrAMINE (BENADRYL) 25 mg capsule Take 25-50 mg by mouth every 6 (six) hours as needed for itching. Reported on 10/30/2015     fluticasone (FLONASE) 50 MCG/ACT nasal spray Place 1 spray into both nostrils daily.     hydrochlorothiazide (HYDRODIURIL) 25 MG tablet Take 1 tablet (25 mg total) by mouth daily. 90 tablet 1   metroNIDAZOLE (METROCREAM) 0.75 % cream Apply 1 application topically daily. Applied to face     nitroGLYCERIN (NITROSTAT) 0.4 MG SL tablet Place 1 tablet (0.4 mg total) under the tongue every 5 (five) minutes x 3 doses as needed for chest pain. 25 tablet 3   olmesartan (BENICAR) 40 MG tablet Take 1 tablet (40 mg total) by mouth daily. 90 tablet 3   rosuvastatin (CRESTOR) 5 MG tablet Take 1 tablet (5 mg total) by mouth daily. (Patient taking differently: Take 5 mg by mouth every evening.) 90 tablet 3   senna-docusate (SENOKOT-S)  8.6-50 MG tablet Take 1 tablet by mouth at bedtime.     triamcinolone ointment (KENALOG) 0.1 % Apply 1 application topically in the morning. Applied to face     No current facility-administered medications on file prior to visit.    Allergies  Allergen Reactions   Metronidazole Hives   Atorvastatin     Muscle pain    Ciprofloxacin Hives   Fentanyl Hives    Itching all over    Morphine And Related     Hallucinations    Tetanus Toxoids     Localized knot    Augmentin [Amoxicillin-Pot Clavulanate] Nausea And Vomiting    Pt has taken Keflex (cephalexin) without adverse reaction. Started 08/06/16    Assessment/Plan:  1. Hyperlipidemia - Most recent LDL above goal 55mg /dL given progressive ASCVD. Will continue rosuvastatin 5 mg daily and start Repatha 140 mg every 14 days. Counseled patient on proper administration, storage, and possible adverse effects. Prior authorization for Repatha submitted and approved through 02/01/22. Cost is $40 per 30 day supply or $80 per 90 day supply which the patient says is affordable for her. Follow up fasting labs scheduled for 09/22/21.   Rebbeca Paul, PharmD PGY2 Ambulatory Care Pharmacy Resident 08/05/2021 11:39 AM

## 2021-08-04 NOTE — Telephone Encounter (Signed)
Called and spoke with pt in regards to CR, pt stated she is not interested in rescheduling for CR at this time.   Closed referral

## 2021-08-05 ENCOUNTER — Other Ambulatory Visit: Payer: Self-pay

## 2021-08-05 ENCOUNTER — Ambulatory Visit: Payer: Medicare PPO | Admitting: Student-PharmD

## 2021-08-05 DIAGNOSIS — E785 Hyperlipidemia, unspecified: Secondary | ICD-10-CM

## 2021-08-05 MED ORDER — REPATHA SURECLICK 140 MG/ML ~~LOC~~ SOAJ: 140.0000 mg | SUBCUTANEOUS | 3 refills | Status: DC

## 2021-08-05 NOTE — Patient Instructions (Signed)
Nice to see you today!  Keep up the good work with diet and exercise. Aim for a diet full of vegetables, fruit and lean meats (chicken, [place], fish). Try to limit carbs (bread, pasta, sugar, rice) and red meat consumption.  Your goal LDL is less than 55 mg/dL, you're currently at 71 mg/dL  Medication Changes: Begin Repatha 140 mg (1 pen) every 14 days.   Continue rosuvastatin 5 mg daily  Please give [place] a call at [phone] with any questions or concerns.  For Repatha, inject once every other week (any day of the week that works for you) into the fatty skin of stomach, upper outer thigh or back of the arm. Clean the site with soap and warm water or an alcohol pad. Keep the medication in the fridge until you are ready to give your dose, then take it out and let warm up to room temperature for 30-60 mins.

## 2021-08-06 ENCOUNTER — Ambulatory Visit (HOSPITAL_COMMUNITY): Payer: Medicare PPO

## 2021-08-07 DIAGNOSIS — R1030 Lower abdominal pain, unspecified: Secondary | ICD-10-CM | POA: Diagnosis not present

## 2021-08-07 DIAGNOSIS — J019 Acute sinusitis, unspecified: Secondary | ICD-10-CM | POA: Diagnosis not present

## 2021-08-08 ENCOUNTER — Ambulatory Visit (HOSPITAL_COMMUNITY): Payer: Medicare PPO

## 2021-08-11 ENCOUNTER — Ambulatory Visit (HOSPITAL_COMMUNITY): Payer: Medicare PPO

## 2021-08-13 ENCOUNTER — Ambulatory Visit (HOSPITAL_COMMUNITY): Payer: Medicare PPO

## 2021-08-15 ENCOUNTER — Ambulatory Visit (HOSPITAL_COMMUNITY): Payer: Medicare PPO

## 2021-08-18 ENCOUNTER — Ambulatory Visit (HOSPITAL_COMMUNITY): Payer: Medicare PPO

## 2021-08-20 ENCOUNTER — Ambulatory Visit (HOSPITAL_COMMUNITY): Payer: Medicare PPO

## 2021-08-22 ENCOUNTER — Ambulatory Visit (HOSPITAL_COMMUNITY): Payer: Medicare PPO

## 2021-08-25 ENCOUNTER — Ambulatory Visit (HOSPITAL_COMMUNITY): Payer: Medicare PPO

## 2021-08-27 ENCOUNTER — Ambulatory Visit (HOSPITAL_COMMUNITY): Payer: Medicare PPO

## 2021-08-29 ENCOUNTER — Ambulatory Visit (HOSPITAL_COMMUNITY): Payer: Medicare PPO

## 2021-09-01 ENCOUNTER — Ambulatory Visit (HOSPITAL_COMMUNITY): Payer: Medicare PPO

## 2021-09-03 ENCOUNTER — Ambulatory Visit (HOSPITAL_COMMUNITY): Payer: Medicare PPO

## 2021-09-03 ENCOUNTER — Encounter: Payer: Self-pay | Admitting: Cardiology

## 2021-09-05 ENCOUNTER — Ambulatory Visit (HOSPITAL_COMMUNITY): Payer: Medicare PPO

## 2021-09-05 DIAGNOSIS — N644 Mastodynia: Secondary | ICD-10-CM | POA: Diagnosis not present

## 2021-09-08 ENCOUNTER — Ambulatory Visit (HOSPITAL_COMMUNITY): Payer: Medicare PPO

## 2021-09-10 ENCOUNTER — Ambulatory Visit (HOSPITAL_COMMUNITY): Payer: Medicare PPO

## 2021-09-12 ENCOUNTER — Ambulatory Visit (HOSPITAL_COMMUNITY): Payer: Medicare PPO

## 2021-09-12 ENCOUNTER — Other Ambulatory Visit: Payer: Self-pay | Admitting: Obstetrics and Gynecology

## 2021-09-12 DIAGNOSIS — N644 Mastodynia: Secondary | ICD-10-CM

## 2021-09-15 ENCOUNTER — Ambulatory Visit (HOSPITAL_COMMUNITY): Payer: Medicare PPO

## 2021-09-16 IMAGING — CT CT CHEST W/O CM
2 of 4 series · 15 of 36 positions shown, 18 images · non-contrast
Comparison: Chest radiograph 11/28/2020

CLINICAL DATA: Aortic disease, nontraumatic. Preop CABG. Chest
pain.

EXAM:
CT CHEST WITHOUT CONTRAST
TECHNIQUE: Multidetector CT imaging of the chest was performed following the
standard protocol without IV contrast.

[Series 4: chest wo · axial · 0.59mm/px · z∈[-384,-118]mm · 12 of 159 slices shown, 15 images]
[im 13/159  mediastinal]
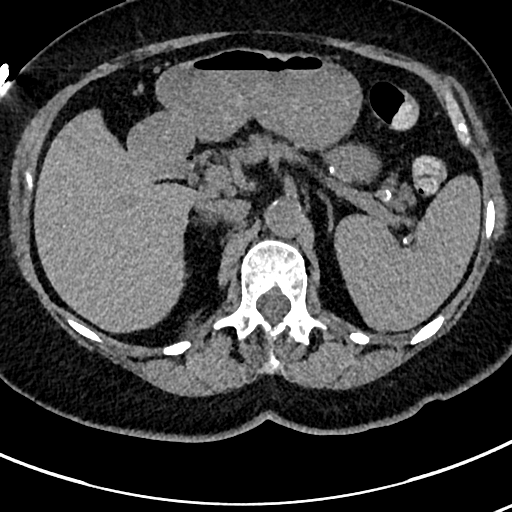
[im 13/159  lung]
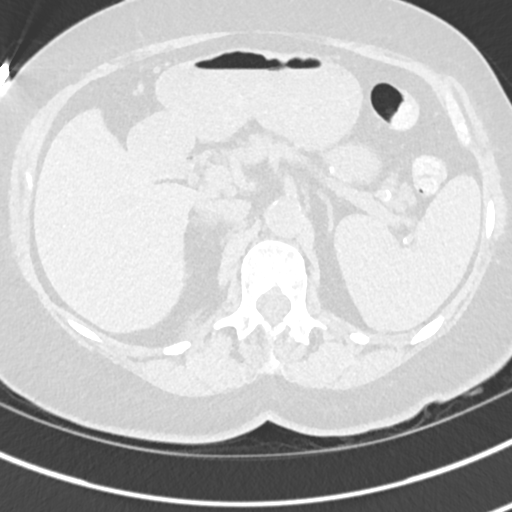
[im 25/159  lung]
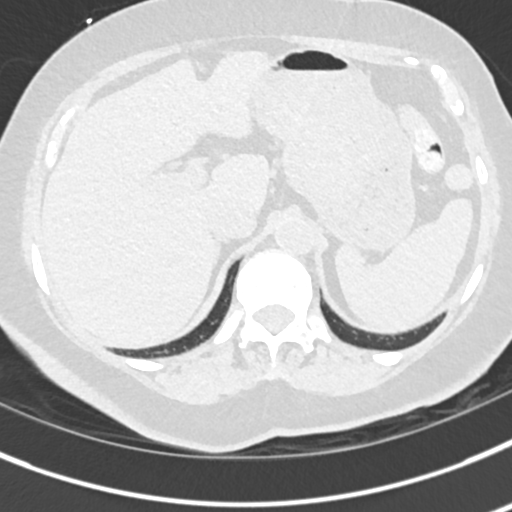
[im 37/159  lung]
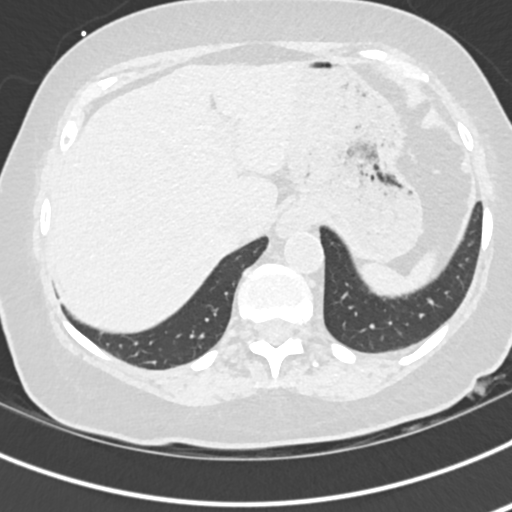
[im 49/159  lung]
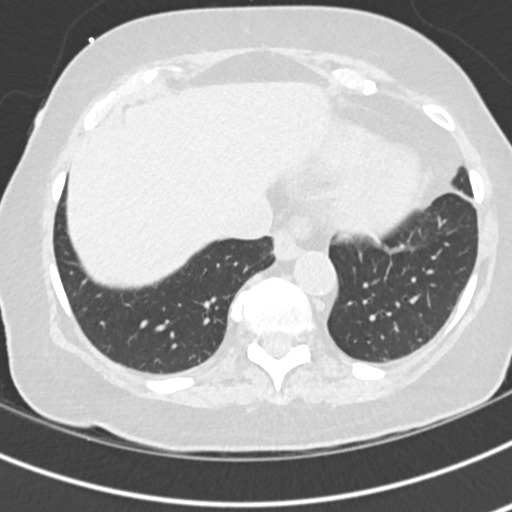
[im 61/159  mediastinal]
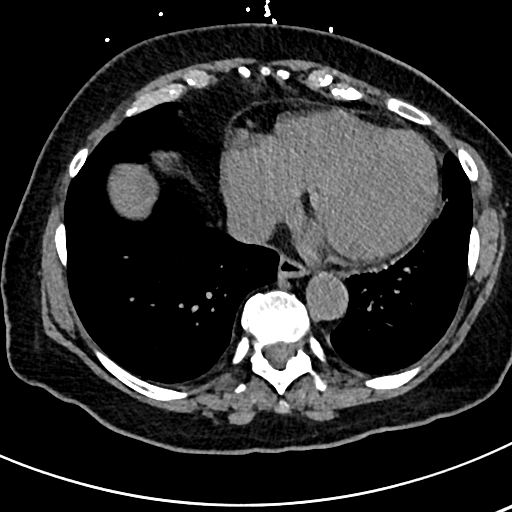
[im 61/159  lung]
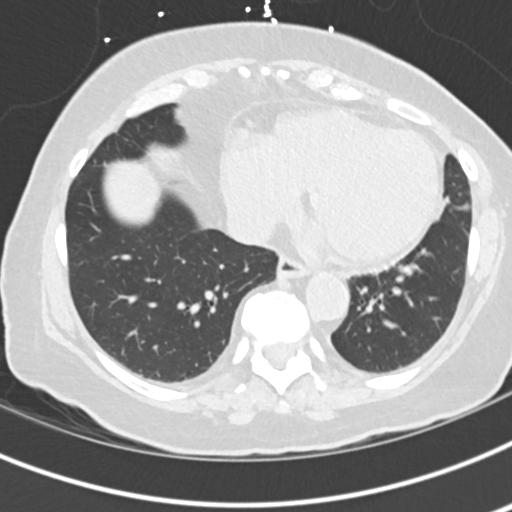
[im 73/159  lung]
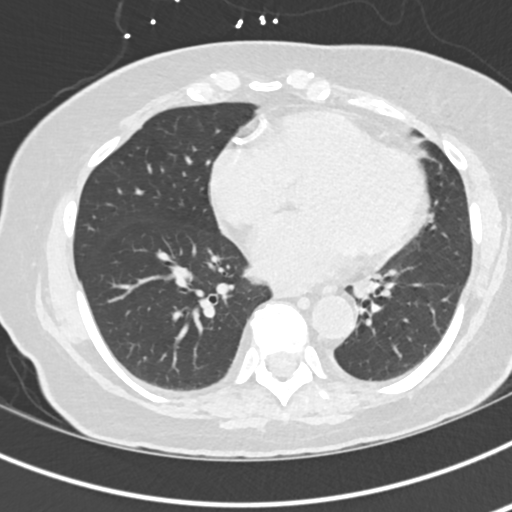
[im 86/159  lung]
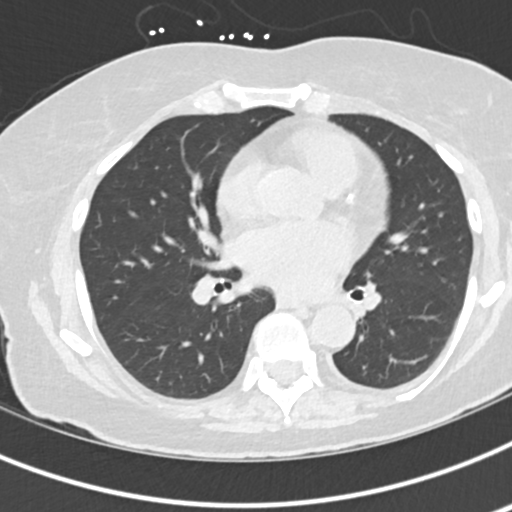
[im 98/159  lung]
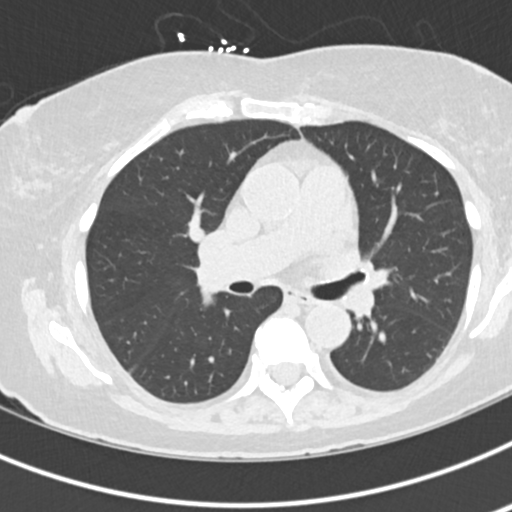
[im 110/159  mediastinal]
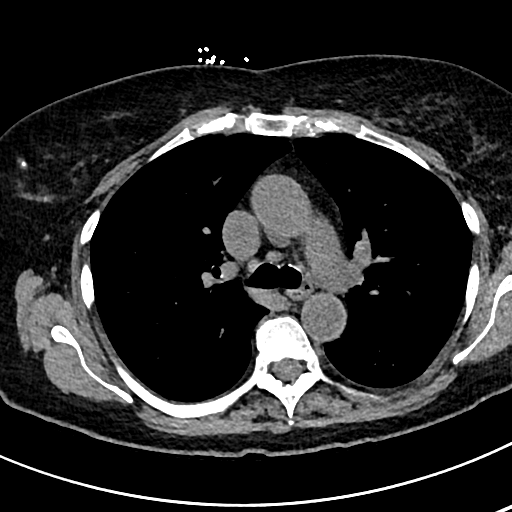
[im 110/159  lung]
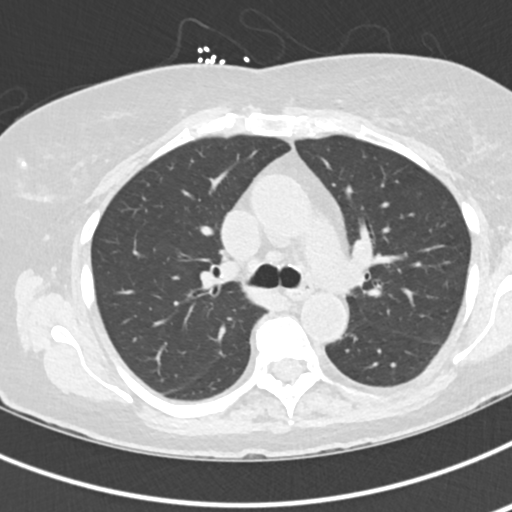
[im 122/159  lung]
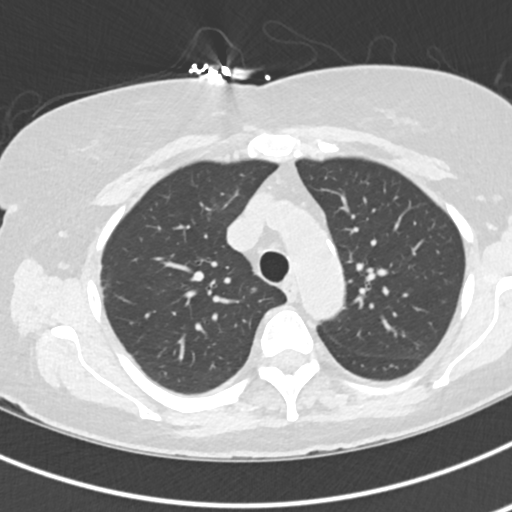
[im 134/159  lung]
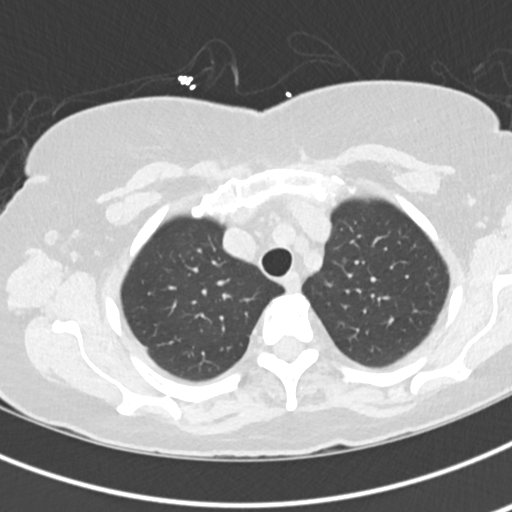
[im 146/159  lung]
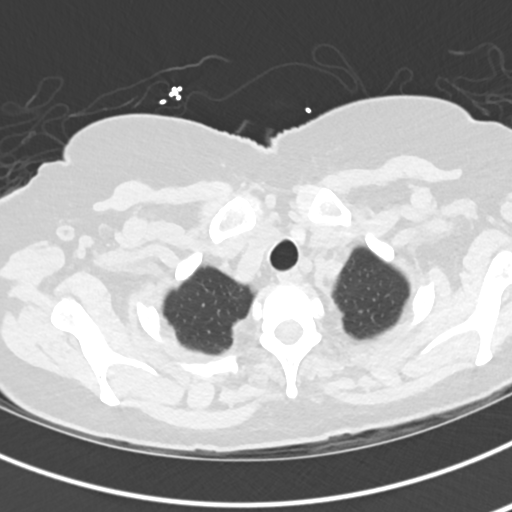

[Series 6: cor · coronal · 0.65mm/px · 3 of 132 slices shown]
[im 27/132  lung]
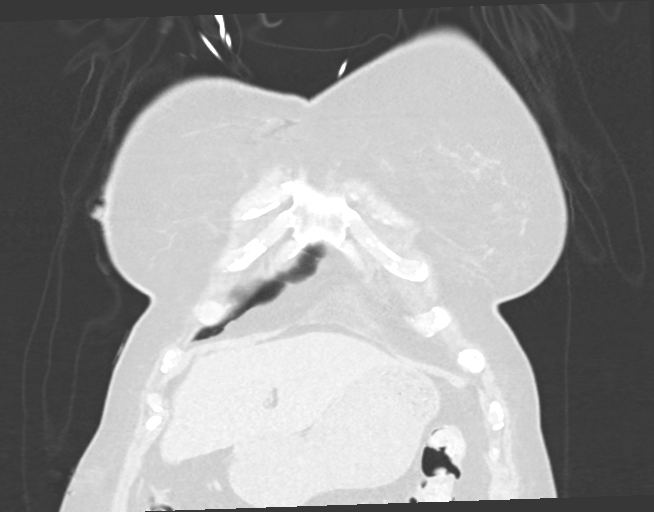
[im 53/132  lung]
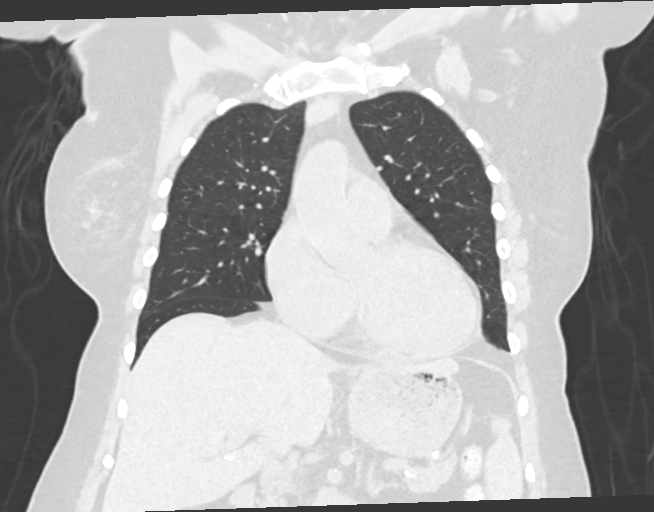
[im 79/132  lung]
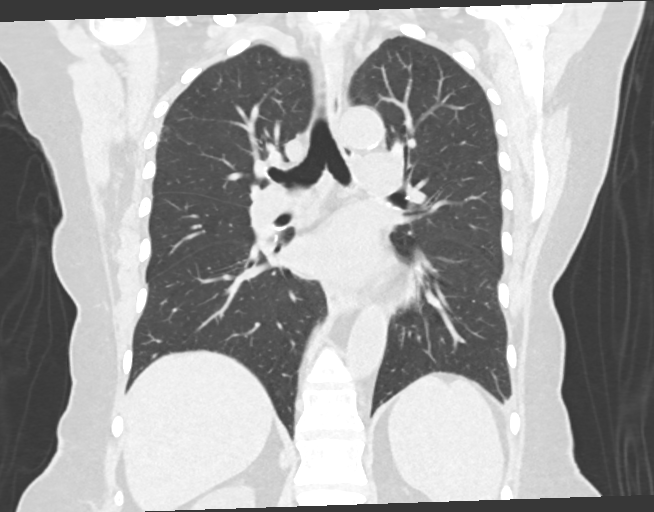

[15 of 36 positions shown; findings below may reference images not displayed]

FINDINGS: Cardiovascular: Mild calcific atherosclerosis of the thoracic aorta
without aneurysm. Three-vessel coronary atherosclerosis. Upper
limits of normal heart size. No pericardial effusion.

Mediastinum/Nodes: No enlarged axillary, mediastinal, or hilar lymph
nodes identified with hilar assessment limited in the absence of IV
contrast. Small sliding hiatal hernia. Unremarkable included
thyroid.

Lungs/Pleura: No pleural effusion or pneumothorax. Mild atelectasis
or scarring in the left greater than right lung bases. 3 mm
ground-glass nodule in the right upper lobe (series 5, image 43). 2
mm solid nodule in the left upper lobe (series 5, image 36). No
airspace consolidation.

Upper Abdomen: Status post cholecystectomy.

Musculoskeletal: No acute osseous abnormality or suspicious osseous
lesion. Mild thoracic spondylosis.
IMPRESSION: 1. No acute abnormality identified in the chest.
2. 2 mm left upper lobe pulmonary nodule. No follow-up needed if
patient is low-risk. Non-contrast chest CT can be considered in 12
months if patient is high-risk. This recommendation follows the
consensus statement: Guidelines for Management of Incidental
Pulmonary Nodules Detected on CT Images: From the [HOSPITAL]
3. 3 mm ground-glass right upper lobe pulmonary nodule. No follow-up
recommended. This recommendation follows the consensus statement:
Guidelines for Management of Incidental Pulmonary Nodules Detected
4. Small sliding hiatal hernia.
5. Aortic Atherosclerosis (46TZB-BW4.4).

## 2021-09-17 ENCOUNTER — Ambulatory Visit (HOSPITAL_COMMUNITY): Payer: Medicare PPO

## 2021-09-19 ENCOUNTER — Ambulatory Visit (HOSPITAL_COMMUNITY): Payer: Medicare PPO

## 2021-09-22 ENCOUNTER — Other Ambulatory Visit: Payer: Medicare PPO | Admitting: *Deleted

## 2021-09-22 ENCOUNTER — Other Ambulatory Visit: Payer: Self-pay

## 2021-09-22 DIAGNOSIS — E785 Hyperlipidemia, unspecified: Secondary | ICD-10-CM | POA: Diagnosis not present

## 2021-09-22 LAB — HEPATIC FUNCTION PANEL
ALT: 10 IU/L (ref 0–32)
AST: 19 IU/L (ref 0–40)
Albumin: 4.2 g/dL (ref 3.7–4.7)
Alkaline Phosphatase: 39 IU/L — ABNORMAL LOW (ref 44–121)
Bilirubin Total: 0.4 mg/dL (ref 0.0–1.2)
Bilirubin, Direct: 0.16 mg/dL (ref 0.00–0.40)
Total Protein: 6 g/dL (ref 6.0–8.5)

## 2021-09-22 LAB — LIPID PANEL
Chol/HDL Ratio: 1.8 ratio (ref 0.0–4.4)
Cholesterol, Total: 92 mg/dL — ABNORMAL LOW (ref 100–199)
HDL: 50 mg/dL (ref >39)
LDL Chol Calc (NIH): 25 mg/dL (ref 0–99)
Triglycerides: 82 mg/dL (ref 0–149)
VLDL Cholesterol Cal: 17 mg/dL (ref 5–40)

## 2021-09-29 ENCOUNTER — Ambulatory Visit
Admission: RE | Admit: 2021-09-29 | Discharge: 2021-09-29 | Disposition: A | Discharge status: Home / Self Care | Payer: Medicare PPO | Source: Ambulatory Visit | Attending: Obstetrics and Gynecology | Admitting: Obstetrics and Gynecology

## 2021-09-29 DIAGNOSIS — N6489 Other specified disorders of breast: Secondary | ICD-10-CM | POA: Diagnosis not present

## 2021-09-29 DIAGNOSIS — N644 Mastodynia: Secondary | ICD-10-CM

## 2021-10-05 NOTE — Progress Notes (Signed)
?Cardiology Office Note:   ? ?Date:  10/06/2021  ? ?ID:  Krystal Mcpherson, DOB Dec 03, 1940, MRN [id number] ? ?PCP:  Krystal Loader, FNP ?  ?[facility] Providers ?Cardiologist:  Krystal Furbish, MD    ? ?Referring MD: Krystal Loader, FNP  ? ?Chief Complaint: follow-up CAD, hyperlipidemia ? ?History of Present Illness:   ? ?Krystal Mcpherson is a 81 y.o. female with a hx of CAD s/p CABG x 3, NSTEMI s/p PCI/DES to RCA, hypertension, obesity, CKD, hyperlipidemia, arthritis, and DVT.  ? ?Right coronary PCI with DES November 2012 with normal LVEF, moderate hypokinesis of inferior wall. Symptoms prior were burning CP, GERD-like discomfort, left arm pain.  ? ?She was diagnosed with DVT of right popliteal and posterior tibial veins 11/01/20 and started on Eliquis. On 11/26/20 she called our office to report multiple symptoms including arm pain. An appointment was scheduled for the following week, but she was advised to go to ED if symptoms worsened. She presented to ED on 11/28/20 with chest pain and ultimately underwent CABG x 3 on 12/04/20 with LIMA-LAD, left radial artery graft to OM of LCx and ramus intermedius in sequenced fashion with left atrial appendage clip. Postoperative course complicated with atrial fibrillation and SVT, as well as sinus bradycardia. Had retained epicardial pacing wires after surgery, one of which was removed from her abdomen by Krystal Mcpherson in the office. The other wire is in her right abdomen and was advised by Krystal Mcpherson that it should not be removed.  ? ?Admission 11/24-11/26/22, presented to [facility] ED with progressive left arm and shoulder pain associated with left hand paresthesias and palpitations. Hs troponin was elevated at 242, EKG showed sinus rhythm with improved ST depression and T wave inversion in lead III.  Troponin remained flat at 244 ? 242 and she had no further arm pain following admission.  Echo revealed normal LV function without regional wall motion abnormalities, mild MR was noted.  Given  recent bypass surgery, angina, and mild troponin elevation cardiac catheterization was performed.Cath on 11/25 revealed 3 of 3 patent grafts with 50% in-stent restenosis in the proximal RCA and new serial 95% and 40% stenoses in the mid RCA.  She otherwise had known occlusive multivessel disease as outlined above.  She underwent successful PCI and drug-eluting stent placement to the proximal and mid RCA.  Plan for triple therapy with aspirin, Plavix and Eliquis x30 days.  Hospital course was uneventful and she was discharged on 05/31/2021. ? ?At follow-up office visit with Krystal Mcpherson on 06/05/2021 at which time she was doing well. She was referred to lipid clinic due to intolerance of statins. Follow-up in 4 months was recommended.  ? ?She was last Mcpherson in our office on 08/05/21 by Krystal Mcpherson and started on Repatha 140 mg every 14 days in addition to rosuvastatin 5 mg for LDL goal of < 55.  ?LDL 09/22/21 25, liver function stable. She called on 09/03/21 with complaints of bruising and bleeding and was advised to continue Plavix and Eliquis and Mcpherson/c aspirin.  ? ?Today, she is here alone for follow-up. She reports that she is feeling well with the exception of bilateral knee pain that has worsened over the past few months.  She was attending cardio classes at her local gym and has had to stop those due to pain.  She was previously walking frequently for exercise but now is limited on certain days by pain.  Walked 2 miles on 3 days ago without  severe pain. Does her own yard work, continues to have easy bruising despite stopping aspirin. Feels better when exercising, wants to return to exercising on a consistent basis. Thinks pain intensified when she started Repatha. Home BP varies quite a bit with SBP 831-517 mmHg, diastolic has been as low as 58 mmHg at times.  Does not monitor consistently.  Felt a little bit lightheaded 2 days ago after working in her yard, BP was low at 115/58. Admits she may have been a little dehydrated.  Is usually careful to consume mostly water. Careful with sodium, does a lot of her own cooking. She denies chest pain, shortness of breath, fatigue, palpitations, melena, hematuria, hemoptysis, diaphoresis, weakness, presyncope, syncope, orthopnea, and PND. Has bilateral lower extremity edema that improves with leg elevation.  ? ?Past Medical History:  ?Diagnosis Date  ? Allergy   ? Anemia   ? Hx  ? Arthritis   ? neck, lower back  ? Asthma   ? Change in bowel habits   ? CKD (chronic kidney disease), stage III ([place])   ? Complication of anesthesia   ? Coronary artery disease   ? a. 2012 s/p PCI ->RCA; b. 12/2020 CABG x 3 (LIMA->LAD, L Radial->RI->OM); c. 05/2021 NSTEMI/PCI: LM 95, LAD 100p/m, D1 90, LCX 70p, 100p/m, RCA 10ost, 50p ISR, 95/62m(p/m RCA treated w/ 3.0x34 Onyx Frontier DES), 3/3 patent grafts.  ? Dyspnea   ? GERD (gastroesophageal reflux disease)   ? HLD (hyperlipidemia)   ? HTN (hypertension)   ? Migraine   ? Myocardial infarction ([facility]   ? Pneumonia   ? PONV (postoperative nausea and vomiting)   ? Post-operative atrial fibrillation (HCC)   ? a. 12/2020 follwing CABG.  ? SVD (spontaneous vaginal delivery)   ? x 2  ? Vitamin B12 deficiency   ? ? ?Past Surgical History:  ?Procedure Laterality Date  ? bladder tact    ? chiara malformation  2001  ? surgery for decompression  ? CHOLECYSTECTOMY    ? CLIPPING OF ATRIAL APPENDAGE  12/04/2020  ? Procedure: CLIPPING OF LEFT ATRIAL APPENDAGE;  Surgeon: AWonda Olds MD;  [facility];  Service: Open Heart Surgery;;  ? COLONOSCOPY  2009   ? stark  ? cornary stent Right 05/2011  ? CORONARY ARTERY BYPASS GRAFT N/A 12/04/2020  ? Procedure: CORONARY ARTERY BYPASS GRAFTING (CABG), ON PUMP, TIMES THREE, USING LEFT INTERNAL MAMMARY ARTERY AND LEFT RADIAL ARTERY;  Surgeon: AWonda Olds MD;  [place] [facility]  Service: Open Heart Surgery;  Laterality: N/A;  ? CORONARY STENT INTERVENTION N/A 05/30/2021  ? Procedure: CORONARY STENT INTERVENTION;  Surgeon: KTroy Sine MD;  [facility];  Service: Cardiovascular;  Laterality: N/A;  ? LEFT HEART CATH AND CORONARY ANGIOGRAPHY N/A 11/28/2020  ? Procedure: LEFT HEART CATH AND CORONARY ANGIOGRAPHY;  Surgeon: JMartinique Peter M, MD;  [facility];  Service: Cardiovascular;  Laterality: N/A;  ? LEFT HEART CATH AND CORS/GRAFTS ANGIOGRAPHY N/A 05/30/2021  ? Procedure: LEFT HEART CATH AND CORS/GRAFTS ANGIOGRAPHY;  Surgeon: KTroy Sine MD;  [facility];  Service: Cardiovascular;  Laterality: N/A;  ? NASAL SINUS SURGERY    ? x 2  ? RADIAL ARTERY HARVEST Left 12/04/2020  ? Procedure: Left RADIAL ARTERY HARVEST;  Surgeon: AWonda Olds MD;  [place] [facility]  Service: Open Heart Surgery;  Laterality: Left;  ? TEE WITHOUT CARDIOVERSION N/A 12/04/2020  ? Procedure: TRANSESOPHAGEAL ECHOCARDIOGRAM (TEE);  Surgeon:  Wonda Olds, MD;  [facility];  Service: Open Heart Surgery;  Laterality: N/A;  ? TOTAL ABDOMINAL HYSTERECTOMY W/ BILATERAL SALPINGOOPHORECTOMY    ? ? ?Current Medications: ?No outpatient medications have been marked as taking for the 10/06/21 encounter (Office Visit) with Ann Maki, Lanice Schwab, NP.  ?  ? ?Allergies:   Metronidazole, Atorvastatin, Ciprofloxacin, Fentanyl, Morphine and related, Tetanus toxoids, and Augmentin [amoxicillin-pot clavulanate]  ? ?Social History  ? ?Socioeconomic History  ? Marital status: Widowed  ?  Spouse name: Not on file  ? Number of children: Not on file  ? Years of education: Not on file  ? Highest education level: Not on file  ?Occupational History  ? Not on file  ?Tobacco Use  ? Smoking status: Never  ? Smokeless tobacco: Never  ?Vaping Use  ? Vaping Use: Never used  ?Substance and Sexual Activity  ? Alcohol use: No  ?  Alcohol/week: 0.0 standard drinks  ? Drug use: No  ? Sexual activity: Never  ?  Birth control/protection: Post-menopausal  ?Other Topics Concern  ? Not on file  ?Social History Narrative  ? Not on file  ? ?Social Determinants of  Health  ? ?Financial Resource Strain: Not on file  ?Food Insecurity: Not on file  ?Transportation Needs: Not on file  ?Physical Activity: Not on file  ?Stress: Not on file  ?Social Connections: Not on

## 2021-10-06 ENCOUNTER — Encounter: Payer: Self-pay | Admitting: Nurse Practitioner

## 2021-10-06 ENCOUNTER — Ambulatory Visit: Payer: Medicare PPO | Admitting: Nurse Practitioner

## 2021-10-06 VITALS — BP 150/80 | HR 64 | Ht 66.0 in | Wt 203.8 lb

## 2021-10-06 DIAGNOSIS — I2510 Atherosclerotic heart disease of native coronary artery without angina pectoris: Secondary | ICD-10-CM | POA: Diagnosis not present

## 2021-10-06 DIAGNOSIS — R600 Localized edema: Secondary | ICD-10-CM

## 2021-10-06 DIAGNOSIS — M25562 Pain in left knee: Secondary | ICD-10-CM | POA: Diagnosis not present

## 2021-10-06 DIAGNOSIS — G8929 Other chronic pain: Secondary | ICD-10-CM

## 2021-10-06 DIAGNOSIS — E785 Hyperlipidemia, unspecified: Secondary | ICD-10-CM | POA: Diagnosis not present

## 2021-10-06 DIAGNOSIS — I480 Paroxysmal atrial fibrillation: Secondary | ICD-10-CM | POA: Diagnosis not present

## 2021-10-06 DIAGNOSIS — M25561 Pain in right knee: Secondary | ICD-10-CM

## 2021-10-06 DIAGNOSIS — I10 Essential (primary) hypertension: Secondary | ICD-10-CM

## 2021-10-06 DIAGNOSIS — I25118 Atherosclerotic heart disease of native coronary artery with other forms of angina pectoris: Secondary | ICD-10-CM | POA: Diagnosis not present

## 2021-10-06 LAB — CBC
Hematocrit: 37.8 % (ref 34.0–46.6)
Hemoglobin: 12.3 g/dL (ref 11.1–15.9)
MCH: 27 pg (ref 26.6–33.0)
MCHC: 32.5 g/dL (ref 31.5–35.7)
MCV: 83 fL (ref 79–97)
Platelets: 223 10*3/uL (ref 150–450)
RBC: 4.55 x10E6/uL (ref 3.77–5.28)
RDW: 12.9 % (ref 11.7–15.4)
WBC: 4.8 10*3/uL (ref 3.4–10.8)

## 2021-10-06 LAB — BASIC METABOLIC PANEL
BUN/Creatinine Ratio: 18 (ref 12–28)
BUN: 18 mg/dL (ref 8–27)
CO2: 26 mmol/L (ref 20–29)
Calcium: 9.4 mg/dL (ref 8.7–10.3)
Chloride: 103 mmol/L (ref 96–106)
Creatinine, Ser: 0.98 mg/dL (ref 0.57–1.00)
Glucose: 93 mg/dL (ref 70–99)
Potassium: 4.5 mmol/L (ref 3.5–5.2)
Sodium: 140 mmol/L (ref 134–144)
eGFR: 59 mL/min/{1.73_m2} — ABNORMAL LOW (ref >59)

## 2021-10-06 NOTE — Patient Instructions (Signed)
Medication Instructions:  ? ? ?Your physician recommends that you continue on your current medications as directed. Please refer to the Current Medication list given to you today. ? ?*If you need a refill on your cardiac medications before your next appointment, please call your pharmacy* ? ? ?Lab Work: ? ?TODAY!!!!! BMET/CBC ? ?If you have labs (blood work) drawn today and your tests are completely normal, you will receive your results only by: ?MyChart Message (if you have MyChart) OR ?A paper copy in the mail ?If you have any lab test that is abnormal or we need to change your treatment, we will call you to review the results. ? ? ?Follow-Up: ?At [facility], you and your health needs are our priority.  As part of our continuing mission to provide you with exceptional heart care, we have created designated Provider Care Teams.  These Care Teams include your primary Cardiologist (physician) and Advanced Practice Providers (APPs -  Physician Assistants and Nurse Practitioners) who all work together to provide you with the care you need, when you need it. ? ?We recommend signing up for the patient portal called "MyChart".  Sign up information is provided on this After Visit Summary.  MyChart is used to connect with patients for Virtual Visits (Telemedicine).  Patients are able to view lab/test results, encounter notes, upcoming appointments, etc.  Non-urgent messages can be sent to your provider as well.   ?To learn more about what you can do with MyChart, go to [email or web address].   ? ?Your next appointment:   ?6 month(s) ? ?The format for your next appointment:   ?In Person ? ?Provider:   ?Candee Furbish, MD   ? ? ?Other Instructions ? ?Please hold your next dose of Repatha and let us know if your knee pain changes, you can send a mychart message. ? ?Please monitor your blood pressure X 2 weeks and let us know if your blood pressure consistently stays above 140/80 X 3 and send mychart message.    ?

## 2021-10-08 DIAGNOSIS — M25562 Pain in left knee: Secondary | ICD-10-CM | POA: Diagnosis not present

## 2021-10-08 DIAGNOSIS — M25561 Pain in right knee: Secondary | ICD-10-CM | POA: Diagnosis not present

## 2021-10-10 DIAGNOSIS — R29898 Other symptoms and signs involving the musculoskeletal system: Secondary | ICD-10-CM | POA: Diagnosis not present

## 2021-10-10 DIAGNOSIS — M19072 Primary osteoarthritis, left ankle and foot: Secondary | ICD-10-CM | POA: Diagnosis not present

## 2021-10-10 DIAGNOSIS — M79672 Pain in left foot: Secondary | ICD-10-CM | POA: Diagnosis not present

## 2021-10-10 DIAGNOSIS — M722 Plantar fascial fibromatosis: Secondary | ICD-10-CM | POA: Diagnosis not present

## 2021-11-10 ENCOUNTER — Other Ambulatory Visit: Payer: Self-pay | Admitting: Nurse Practitioner

## 2021-12-08 ENCOUNTER — Other Ambulatory Visit: Payer: Self-pay | Admitting: Nurse Practitioner

## 2022-01-02 ENCOUNTER — Emergency Department (HOSPITAL_COMMUNITY)
Admission: EM | Admit: 2022-01-02 | Discharge: 2022-01-02 | Disposition: A | Discharge status: Home / Self Care | Payer: Medicare PPO | Attending: Emergency Medicine | Admitting: Emergency Medicine

## 2022-01-02 ENCOUNTER — Telehealth: Payer: Self-pay | Admitting: Cardiology

## 2022-01-02 ENCOUNTER — Other Ambulatory Visit: Payer: Self-pay

## 2022-01-02 ENCOUNTER — Encounter (HOSPITAL_COMMUNITY): Payer: Self-pay

## 2022-01-02 DIAGNOSIS — Z79899 Other long term (current) drug therapy: Secondary | ICD-10-CM | POA: Insufficient documentation

## 2022-01-02 DIAGNOSIS — Z951 Presence of aortocoronary bypass graft: Secondary | ICD-10-CM | POA: Insufficient documentation

## 2022-01-02 DIAGNOSIS — I129 Hypertensive chronic kidney disease with stage 1 through stage 4 chronic kidney disease, or unspecified chronic kidney disease: Secondary | ICD-10-CM | POA: Insufficient documentation

## 2022-01-02 DIAGNOSIS — R600 Localized edema: Secondary | ICD-10-CM | POA: Diagnosis not present

## 2022-01-02 DIAGNOSIS — I160 Hypertensive urgency: Secondary | ICD-10-CM | POA: Diagnosis not present

## 2022-01-02 DIAGNOSIS — Z7902 Long term (current) use of antithrombotics/antiplatelets: Secondary | ICD-10-CM | POA: Insufficient documentation

## 2022-01-02 DIAGNOSIS — N189 Chronic kidney disease, unspecified: Secondary | ICD-10-CM | POA: Diagnosis not present

## 2022-01-02 DIAGNOSIS — Z7901 Long term (current) use of anticoagulants: Secondary | ICD-10-CM | POA: Diagnosis not present

## 2022-01-02 DIAGNOSIS — R001 Bradycardia, unspecified: Secondary | ICD-10-CM | POA: Diagnosis not present

## 2022-01-02 DIAGNOSIS — I2510 Atherosclerotic heart disease of native coronary artery without angina pectoris: Secondary | ICD-10-CM | POA: Diagnosis not present

## 2022-01-02 DIAGNOSIS — R030 Elevated blood-pressure reading, without diagnosis of hypertension: Secondary | ICD-10-CM | POA: Diagnosis not present

## 2022-01-02 LAB — COMPREHENSIVE METABOLIC PANEL
ALT: 13 U/L (ref 0–44)
AST: 18 U/L (ref 15–41)
Albumin: 3.7 g/dL (ref 3.5–5.0)
Alkaline Phosphatase: 30 U/L — ABNORMAL LOW (ref 38–126)
Anion gap: 8 (ref 5–15)
BUN: 14 mg/dL (ref 8–23)
CO2: 29 mmol/L (ref 22–32)
Calcium: 9.3 mg/dL (ref 8.9–10.3)
Chloride: 99 mmol/L (ref 98–111)
Creatinine, Ser: 1 mg/dL (ref 0.44–1.00)
GFR, Estimated: 58 mL/min — ABNORMAL LOW (ref >60)
Glucose, Bld: 89 mg/dL (ref 70–99)
Potassium: 4.1 mmol/L (ref 3.5–5.1)
Sodium: 136 mmol/L (ref 135–145)
Total Bilirubin: 0.8 mg/dL (ref 0.3–1.2)
Total Protein: 5.8 g/dL — ABNORMAL LOW (ref 6.5–8.1)

## 2022-01-02 LAB — CBC
HCT: 40.6 % (ref 36.0–46.0)
Hemoglobin: 13.2 g/dL (ref 12.0–15.0)
MCH: 27.4 pg (ref 26.0–34.0)
MCHC: 32.5 g/dL (ref 30.0–36.0)
MCV: 84.4 fL (ref 80.0–100.0)
Platelets: 214 10*3/uL (ref 150–400)
RBC: 4.81 MIL/uL (ref 3.87–5.11)
RDW: 14.5 % (ref 11.5–15.5)
WBC: 5.4 10*3/uL (ref 4.0–10.5)
nRBC: 0 % (ref 0.0–0.2)

## 2022-01-02 LAB — TROPONIN I (HIGH SENSITIVITY)
Troponin I (High Sensitivity): 11 ng/L (ref <18)
Troponin I (High Sensitivity): 11 ng/L (ref <18)

## 2022-01-02 LAB — MAGNESIUM: Magnesium: 2.1 mg/dL (ref 1.7–2.4)

## 2022-01-02 MED ORDER — HYDRALAZINE HCL 25 MG PO TABS
50.0000 mg | ORAL_TABLET | Freq: Once | ORAL | Status: AC
  Administered 2022-01-02: 50 mg via ORAL
  Filled 2022-01-02: qty 2

## 2022-01-02 MED ORDER — HYDRALAZINE HCL 10 MG PO TABS: 10.0000 mg | ORAL_TABLET | Freq: Once | ORAL | Status: DC

## 2022-01-02 MED ORDER — HYDRALAZINE HCL 10 MG PO TABS: 10.0000 mg | ORAL_TABLET | Freq: Three times a day (TID) | ORAL | 0 refills | Status: DC

## 2022-01-02 NOTE — ED Triage Notes (Signed)
Pt arrives POV for eval of hypertension x 3 days. Pt reports compliance w/ blood pressure medication. Pt reports she has been checking her blood pressure daily the last few days due to not feeling well. States ongoing weakness the last few days. Bradycarcic to the mid 30s in triage, does take carvedilol for rate control.

## 2022-01-02 NOTE — Telephone Encounter (Signed)
Pt c/o BP issue: STAT if pt c/o blurred vision, one-sided weakness or slurred speech  1. What are your last 5 BP readings? 196/102; 50; 199/79; 50  2. Are you having any other symptoms (ex. Dizziness, headache, blurred vision, passed out)? Headache; back right shoulder/neck pain  3. What is your BP issue? Patient is in urgent care in [facility] and is reporting high BP

## 2022-01-02 NOTE — ED Provider Triage Note (Signed)
Emergency Medicine Provider Triage Evaluation Note  Krystal Mcpherson , a 81 y.o. female  was evaluated in triage.  Pt complains of significantly elevated blood pressure since the first of the week, reported that she had blood pressure with systolic of 076 on Monday, and has been increasing since then.  She reports that she has been feeling overall not very well with some weakness.  She has bradycardia in triage, with rate around the mid 40s.  She takes Plavix secondary to previous triple bypass and other stents, she is also on Eliquis secondary to DVT last year.  Patient denies any chest pain, shortness of breath..  Review of Systems  Positive: Bradycardia, dizziness, general malaise Negative: Chest pain, shortness of breath  Physical Exam  BP (!) 212/63 (BP Location: Right Arm)   Pulse (!) 46   Temp 98.3 F (36.8 C) (Oral)   Resp 16   Ht '5\' 6"'$  (1.676 m)   Wt 93 kg   SpO2 98%   BMI 33.09 kg/m  Gen:   Awake, no distress   Resp:  Normal effort  MSK:   Moves extremities without difficulty  Other:  Heart with bradycardic rate and regular rhythm  Medical Decision Making  Medically screening exam initiated at 2:20 PM.  Appropriate orders placed.  Krystal Mcpherson was informed that the remainder of the evaluation will be completed by another provider, this initial triage assessment does not replace that evaluation, and the importance of remaining in the ED until their evaluation is complete.  Workup initiated   Anselmo Pickler, [place] 01/02/22 1422

## 2022-01-02 NOTE — Telephone Encounter (Signed)
Called patient back about message. Patient went to urgent care and since her BP is so high and not coming down with her regular scheduled medications. She is having neck and shoulder pain with a headache, so urgent care advised patient to go to ED. Patient refused to go by EMS, because patient only wanted to go to [facility]. Patient is having someone drive her to [facility]. Encouraged patient if her symptoms get worse to call 911.

## 2022-01-02 NOTE — ED Provider Notes (Signed)
[facility] EMERGENCY DEPARTMENT Provider Note   CSN: [id number] Arrival date & time: 01/02/22  1349     History  Chief Complaint  Patient presents with   Hypertension    Krystal Mcpherson is a 81 y.o. female.  81 year old female with a past medication history of CAD s/p CABG 12/2020 then NSTEMI/PCI in 05/2021, left atrial appendage clipping, DVT in Eliquis, HTN, CKD presents to the ED with elevated blood pressure.  Patient states that she has had significantly elevated blood pressure over the last 6 days, despite compliance with her home antihypertensive.  She states that pressures were initially elevated with systolics in the 782N but have been in the 200s today.  She endorses associated frontal headache earlier today and took Tylenol for this with improvement.  She denies associated vision changes, chest pain, shortness of breath, or other recent infectious symptoms.  She states that she has been compliant with her carvedilol and olmesartan.  The history is provided by the patient.       Home Medications Prior to Admission medications   Medication Sig Start Date End Date Taking? Authorizing Provider  hydrALAZINE (APRESOLINE) 10 MG tablet Take 1 tablet (10 mg total) by mouth 3 (three) times daily for 14 days. 01/02/22 01/16/22 Yes Porfirio Bollier, [place], MD  acetaminophen (TYLENOL) 500 MG tablet Take 500-1,000 mg by mouth every 6 (six) hours as needed for mild pain (pain).    [provider]  albuterol (PROVENTIL HFA;VENTOLIN HFA) 108 (90 BASE) MCG/ACT inhaler Inhale 2 puffs into the lungs every 4 (four) hours as needed for wheezing or shortness of breath. Reported on 10/30/2015    [provider]  apixaban (ELIQUIS) 5 MG TABS tablet Take 5 mg by mouth 2 (two) times daily.    [provider]  Artificial Tear Ointment (DRY EYES OP) Place 1 drop into both eyes 2 (two) times daily as needed (dry eyes).    [provider]  carvedilol (COREG) 6.25 MG  tablet TAKE 1 TABLET BY MOUTH TWICE DAILY WITH MEALS 12/09/21   Jerline Pain, MD  cetirizine (ZYRTEC) 10 MG tablet Take 10 mg by mouth in the morning.    [provider]  clopidogrel (PLAVIX) 75 MG tablet TAKE 1 TABLET BY MOUTH EVERY MORNING WITH BREAKFAST 11/10/21   Swinyer, Lanice Schwab, NP  Evolocumab (REPATHA SURECLICK) 562 MG/ML SOAJ Inject 140 mg into the skin every 14 (fourteen) days. 08/05/21   Jerline Pain, MD  fluticasone (FLONASE) 50 MCG/ACT nasal spray Place 1 spray into both nostrils daily.    [provider]  hydrochlorothiazide (HYDRODIURIL) 25 MG tablet Take 1 tablet (25 mg total) by mouth daily. 03/14/21 07/15/22  Loel Dubonnet, NP  metroNIDAZOLE (METROCREAM) 0.75 % cream Apply 1 application topically daily. Applied to face    [provider]  nitroGLYCERIN (NITROSTAT) 0.4 MG SL tablet Place 1 tablet (0.4 mg total) under the tongue every 5 (five) minutes x 3 doses as needed for chest pain. 05/31/21   Theora Gianotti, NP  olmesartan (BENICAR) 40 MG tablet Take 1 tablet (40 mg total) by mouth daily. 04/28/21   Jerline Pain, MD  rosuvastatin (CRESTOR) 5 MG tablet Take 1 tablet (5 mg total) by mouth daily. Patient taking differently: Take 5 mg by mouth every evening. 06/05/21 09/03/21  Jerline Pain, MD  senna-docusate (SENOKOT-S) 8.6-50 MG tablet Take 1 tablet by mouth at bedtime.    [provider]  triamcinolone ointment (KENALOG) 0.1 %  Apply 1 application topically in the morning. Applied to face    [provider]      Allergies    Metronidazole, Atorvastatin, Ciprofloxacin, Fentanyl, Morphine and related, Tetanus toxoids, and Augmentin [amoxicillin-pot clavulanate]    Review of Systems   Review of Systems  Constitutional:  Negative for fever.  Eyes:  Negative for visual disturbance.  Respiratory:  Negative for shortness of breath.   Cardiovascular:  Negative for chest pain.  Gastrointestinal:  Negative for abdominal pain,  nausea and vomiting.  Neurological:  Positive for headaches. Negative for dizziness.    Physical Exam Updated Vital Signs BP (!) 157/63   Pulse (!) 49   Temp 98.3 F (36.8 C) (Oral)   Resp 13   Ht '5\' 6"'$  (1.676 m)   Wt 93 kg   SpO2 100%   BMI 33.09 kg/m  Physical Exam Vitals and nursing note reviewed.  Constitutional:      General: She is not in acute distress.    Appearance: Normal appearance. She is well-developed. She is not ill-appearing.  HENT:     Head: Normocephalic and atraumatic.  Eyes:     Conjunctiva/sclera: Conjunctivae normal.  Cardiovascular:     Rate and Rhythm: Normal rate and regular rhythm.     Pulses:          Radial pulses are 2+ on the right side and 2+ on the left side.     Heart sounds: No murmur heard. Pulmonary:     Effort: Pulmonary effort is normal. No respiratory distress.     Breath sounds: Normal breath sounds.  Abdominal:     Palpations: Abdomen is soft.     Tenderness: There is no abdominal tenderness. There is no guarding.  Musculoskeletal:     Right lower leg: 1+ Edema present.     Left lower leg: 1+ Edema present.  Skin:    General: Skin is warm and dry.  Neurological:     Mental Status: She is alert.     ED Results / Procedures / Treatments   Labs (all labs ordered are listed, but only abnormal results are displayed) Labs Reviewed  COMPREHENSIVE METABOLIC PANEL - Abnormal; Notable for the following components:      Result Value   Total Protein 5.8 (*)    Alkaline Phosphatase 30 (*)    GFR, Estimated 58 (*)    All other components within normal limits  CBC  MAGNESIUM  TROPONIN I (HIGH SENSITIVITY)  TROPONIN I (HIGH SENSITIVITY)    EKG EKG Interpretation  Date/Time:  Friday January 02 2022 13:59:36 EDT Ventricular Rate:  47 PR Interval:  172 QRS Duration: 74 QT Interval:  458 QTC Calculation: 405 R Axis:   59 Text Interpretation: Sinus bradycardia Cannot rule out Anterior infarct , age undetermined Abnormal ECG When  compared with ECG of 31-May-2021 04:56, PREVIOUS ECG IS PRESENT Confirmed by Thamas Jaegers ([id number]) on 01/02/2022 7:22:14 PM  Radiology No results found.  Procedures Procedures    Medications Ordered in ED Medications  hydrALAZINE (APRESOLINE) tablet 50 mg (50 mg Oral Given 01/02/22 1948)    ED Course/ Medical Decision Making/ A&P                           Medical Decision Making Amount and/or Complexity of Data Reviewed External Data Reviewed: labs and notes. Labs: ordered. ECG/medicine tests: ordered and independent interpretation performed.  Risk Prescription drug management.   81 year old female with a  history as above presents today with concerns for hypertensive urgency.  On arrival, she is hypertensive at 220/82 and bradycardic in the 40s-50s of which she has a known history.  Afebrile in the ED. She complains of a mild headache but is otherwise asymptomatic without chest pain, dizziness, shortness of breath, or visual changes at this time.  I reviewed and interpreted the patient's EKG which is notable for sinus bradycardia without ST segment changes, no T wave inversions.  I reviewed and interpreted the patient's labs which are largely reassuring.  She has no evidence of endorgan damage, no new anemia, renal or liver dysfunction.  No metabolic disturbances at this time.  Troponin stable at 11 x2.  She is not encephalopathic or confused, denies other neurologic symptoms that would warrant CT imaging at this time.  Given her bradycardia and hypertension, decision was made to treat with a dose of oral hydralazine.  Blood pressure improved following this.  We will plan to start a very low-dose of hydralazine 3 times daily with the patient following up with her primary care provider in 2 days for blood pressure recheck.  This plan was discussed extensively with the patient who verbalized understanding.  Advised on return to the emergency department for any medication side effects.  At  this time, patient stable for discharge home.  Final Clinical Impression(s) / ED Diagnoses Final diagnoses:  Hypertensive urgency    Rx / DC Orders ED Discharge Orders          Ordered    hydrALAZINE (APRESOLINE) 10 MG tablet  3 times daily        01/02/22 2028              Kambrie Eddleman, [place], MD 01/02/22 2056    Luna Fuse, MD 01/03/22 [phone]

## 2022-01-02 NOTE — Discharge Instructions (Addendum)
You will need to see your doctor in 2 days for a repeat blood pressure check. We will start you on a low dose blood pressure medicine called hydralazine. You will take this 3 times per day. Your lab work today looked good. If you develop any severe headaches, dizziness or feel like you are going to pass out, please return to the emergency room for evaluation.

## 2022-01-05 DIAGNOSIS — I2510 Atherosclerotic heart disease of native coronary artery without angina pectoris: Secondary | ICD-10-CM | POA: Diagnosis not present

## 2022-01-05 DIAGNOSIS — Z951 Presence of aortocoronary bypass graft: Secondary | ICD-10-CM | POA: Diagnosis not present

## 2022-01-05 DIAGNOSIS — I10 Essential (primary) hypertension: Secondary | ICD-10-CM | POA: Diagnosis not present

## 2022-01-07 NOTE — Telephone Encounter (Signed)
Was evaluated and treated in ED -  81 year old female with a history as above presents today with concerns for hypertensive urgency.  On arrival, she is hypertensive at 220/82 and bradycardic in the 40s-50s of which she has a known history.  Afebrile in the ED. She complains of a mild headache but is otherwise asymptomatic without chest pain, dizziness, shortness of breath, or visual changes at this time.  I reviewed and interpreted the patient's EKG which is notable for sinus bradycardia without ST segment changes, no T wave inversions.   I reviewed and interpreted the patient's labs which are largely reassuring.  She has no evidence of endorgan damage, no new anemia, renal or liver dysfunction.  No metabolic disturbances at this time.  Troponin stable at 11 x2.  She is not encephalopathic or confused, denies other neurologic symptoms that would warrant CT imaging at this time.   Given her bradycardia and hypertension, decision was made to treat with a dose of oral hydralazine.  Blood pressure improved following this.  We will plan to start a very low-dose of hydralazine 3 times daily with the patient following up with her primary care provider in 2 days for blood pressure recheck.  This plan was discussed extensively with the patient who verbalized understanding.  Advised on return to the emergency department for any medication side effects.  At this time, patient stable for discharge home.

## 2022-01-09 ENCOUNTER — Ambulatory Visit (HOSPITAL_BASED_OUTPATIENT_CLINIC_OR_DEPARTMENT_OTHER): Payer: Medicare PPO | Admitting: Family

## 2022-01-09 ENCOUNTER — Encounter (HOSPITAL_BASED_OUTPATIENT_CLINIC_OR_DEPARTMENT_OTHER): Payer: Self-pay | Admitting: Family

## 2022-01-09 VITALS — BP 144/60 | HR 56 | Ht 66.0 in | Wt 204.0 lb

## 2022-01-09 DIAGNOSIS — D6859 Other primary thrombophilia: Secondary | ICD-10-CM

## 2022-01-09 DIAGNOSIS — I10 Essential (primary) hypertension: Secondary | ICD-10-CM

## 2022-01-09 DIAGNOSIS — E785 Hyperlipidemia, unspecified: Secondary | ICD-10-CM

## 2022-01-09 DIAGNOSIS — I25118 Atherosclerotic heart disease of native coronary artery with other forms of angina pectoris: Secondary | ICD-10-CM | POA: Diagnosis not present

## 2022-01-09 DIAGNOSIS — Z86718 Personal history of other venous thrombosis and embolism: Secondary | ICD-10-CM | POA: Diagnosis not present

## 2022-01-09 MED ORDER — ASPIRIN 81 MG PO TBEC: 81.0000 mg | DELAYED_RELEASE_TABLET | Freq: Every day | ORAL | 3 refills | Status: DC

## 2022-01-09 MED ORDER — HYDRALAZINE HCL 10 MG PO TABS: 5.0000 mg | ORAL_TABLET | Freq: Two times a day (BID) | ORAL | 6 refills | Status: DC

## 2022-01-09 NOTE — Progress Notes (Unsigned)
Office Visit    Patient Name: Krystal Mcpherson Date of Encounter: 01/09/2022  PCP:  Kristen Loader, [facility]  Cardiologist:  Candee Furbish, MD  Advanced Practice Provider:  No care team member to display Electrophysiologist:  None   Chief Complaint    Krystal Mcpherson is a 81 y.o. female with a hx of CAD s/p PCI to RCA and subsequent CABG, hypertension, hyperlipidemia, DVT, bradycardia, postoperative atrial fibrillation, SVT presents today for follow up of CAD.  Past Medical History    Past Medical History:  Diagnosis Date   Allergy    Anemia    Hx   Arthritis    neck, lower back   Asthma    Change in bowel habits    CKD (chronic kidney disease), stage III ([place])    Complication of anesthesia    Coronary artery disease    a. 2012 s/p PCI ->RCA; b. 12/2020 CABG x 3 (LIMA->LAD, L Radial->RI->OM); c. 05/2021 NSTEMI/PCI: LM 95, LAD 100p/m, D1 90, LCX 70p, 100p/m, RCA 10ost, 50p ISR, 95/61m(p/m RCA treated w/ 3.0x34 Onyx Frontier DES), 3/3 patent grafts.   Dyspnea    GERD (gastroesophageal reflux disease)    HLD (hyperlipidemia)    HTN (hypertension)    Migraine    Myocardial infarction (HCC)    Pneumonia    PONV (postoperative nausea and vomiting)    Post-operative atrial fibrillation ([place]    a. 12/2020 follwing CABG.   SVD (spontaneous vaginal delivery)    x 2   Vitamin B12 deficiency    Past Surgical History:  Procedure Laterality Date   bladder tact     chiara malformation  2001   surgery for decompression   CHOLECYSTECTOMY     CLIPPING OF ATRIAL APPENDAGE  12/04/2020   Procedure: CLIPPING OF LEFT ATRIAL APPENDAGE;  Surgeon: AWonda Olds MD;  Location: [facility] OR;  Service: Open Heart Surgery;;   COLONOSCOPY  2009    stark   cornary stent Right 05/2011   CORONARY ARTERY BYPASS GRAFT N/A 12/04/2020   Procedure: CORONARY ARTERY BYPASS GRAFTING (CABG), ON PUMP, TIMES THREE, USING LEFT INTERNAL MAMMARY ARTERY AND LEFT RADIAL ARTERY;  Surgeon:  AWonda Olds MD;  [facility]  Service: Open Heart Surgery;  Laterality: N/A;   CORONARY STENT INTERVENTION N/A 05/30/2021   Procedure: CORONARY STENT INTERVENTION;  Surgeon: KTroy Sine MD;  [facility];  Service: Cardiovascular;  Laterality: N/A;   LEFT HEART CATH AND CORONARY ANGIOGRAPHY N/A 11/28/2020   Procedure: LEFT HEART CATH AND CORONARY ANGIOGRAPHY;  Surgeon: JMartinique Peter M, MD;  [facility];  Service: Cardiovascular;  Laterality: N/A;   LEFT HEART CATH AND CORS/GRAFTS ANGIOGRAPHY N/A 05/30/2021   Procedure: LEFT HEART CATH AND CORS/GRAFTS ANGIOGRAPHY;  Surgeon: KTroy Sine MD;  [facility];  Service: Cardiovascular;  Laterality: N/A;   NASAL SINUS SURGERY     x 2   RADIAL ARTERY HARVEST Left 12/04/2020   Procedure: Left RADIAL ARTERY HARVEST;  Surgeon: AWonda Olds MD;  [facility]  Service: Open Heart Surgery;  Laterality: Left;   TEE WITHOUT CARDIOVERSION N/A 12/04/2020   Procedure: TRANSESOPHAGEAL ECHOCARDIOGRAM (TEE);  Surgeon: AWonda Olds MD;  [place] [facility]  Service: Open Heart Surgery;  Laterality: N/A;   TOTAL ABDOMINAL HYSTERECTOMY W/ BILATERAL SALPINGOOPHORECTOMY      Allergies  Allergies  Allergen Reactions   Metronidazole Hives  Atorvastatin     Muscle pain    Ciprofloxacin Hives   Fentanyl Hives    Itching all over    Morphine And Related     Hallucinations    Tetanus Toxoids     Localized knot    Augmentin [Amoxicillin-Pot Clavulanate] Nausea And Vomiting    Pt has taken Keflex (cephalexin) without adverse reaction. Started 08/06/16    History of Present Illness    Krystal Mcpherson is a 81 y.o. female with a hx of CAD s/p PCI to RCA and subsequent CABG, hypertension, hyperlipidemia, DVT, bradycardia, postoperative atrial fibrillation, SVT  last seen 01/10/2021  She had previous PTCI with stenting to the right coronary artery in 2012.  She has been followed routinely by  Dr. Marlou Porch.  She was diagnosed 10/2020 with deep vein thrombosis of the right popliteal and posterior tibial veins.  She was started on Eliquis at that time.  On 524/22 she began having left arm pain and numbness and when contacted our office was advised to proceed to the emergency department.  She was evaluated in the emergency department 11/28/2020 with initial EKG showing diffuse ST depressions, sinus bradycardia, frequent PVC.  HS troponin 1622 ? 3200.  She was diagnosed with NSTEMI and transferred to [facility].  Underwent cardiac catheterization 11/28/2020 showing patent RCA stent with 30% stenosis, mid RCA 50% stenosis, proximal LAD 90% stenosis in the ostium of the diagonal and 80% stenosis, 90% stenosis in first obtuse marginal coronary artery and mid circumflex totally occluded with right to left collaterals.  Echocardiogram with LVEF 55 to 60%, no significant valvular abnormalities.  Carotid duplex showed no evidence of carotid disease bilaterally.  She did require Plavix washout and underwent CABG times 36/1/22 with Dr. Orvan Seen.  She was started on isosorbide for left radial artery harvest.  She did have atrial fibrillation while ambulating with heart rate getting up to the low 100s and her metoprolol was increased.  She was seen 01/10/2021.  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  Did note blood pressure at home was labile and her hydrochlorothiazide was reduced to 12.5 mg daily.  She was very worried about how long it was going to take her to recover from surgery and noted neuropathic pain near her midsternal incision.  She was subsequently seen in follow-up by cardiothoracic surgery and had left pacing wire removed.  She was seen yesterday as the right pacing wire is still causing her discomfort and they may have to return to the OR for removal.  She presents today for follow-up.  Denies chest pain, Shikhman tightness.  Reports no shortness of breath at rest and that her dyspnea on  exertion is improving.  She continues to exercise and stay very active.  Her blood pressure at home has been routinely in the 120s or 130s though was elevated at clinic visit today and yesterday.  *** Notes Hydralazine causing diarrhea.  Does eat mostly at home, fresh and frozen. Does like to snack.   Denies chest pain, pressure, tightness. Mild exertional dyspnea with more than usual activity.   Enjoys working out in her yard for exercise. Working 3-4 hours per day in the yard.   Note sleg and toe cramps - relieved by mustard  EKGs/Labs/Other Studies Reviewed:   The following studies were reviewed today:    Echo 11/28/2020: 1. Left ventricular ejection fraction, by estimation, is 55 to 60%. The  left ventricle has normal function. The left ventricle demonstrates  regional  wall motion abnormalities (see scoring diagram/findings for  description). Left ventricular diastolic  parameters are consistent with Grade I diastolic dysfunction (impaired  relaxation).   2. Right ventricular systolic function is normal. The right ventricular  size is normal. There is normal pulmonary artery systolic pressure. The  estimated right ventricular systolic pressure is 62.8 mmHg.   3. The mitral valve is grossly normal. Trivial mitral valve  regurgitation. No evidence of mitral stenosis.   4. The aortic valve is tricuspid. Aortic valve regurgitation is not  visualized. No aortic stenosis is present.   5. The inferior vena cava is normal in size with greater than 50%  respiratory variability, suggesting right atrial pressure of 3 mmHg.   Left heart cath 11/28/2020: Prox LAD to Mid LAD lesion is 90% stenosed. 1st Diag lesion is 80% stenosed. 1st Mrg lesion is 90% stenosed. Mid Cx lesion is 100% stenosed. Prox RCA lesion is 30% stenosed. Prox RCA to Mid RCA lesion is 50% stenosed. LV end diastolic pressure is mildly elevated.   1. 3 vessel obstructive CAD    - 90% proximal to mid LAD, 80% first  diagonal    - 90% large OM1, 100% LCX post OM1 with right to left collaterals.    - Patent stent in the proximal RCA. 50% mid vessel 2. Mildly elevated LVEDP  EKG:  No EKG today.   Recent Labs: 01/17/2021: TSH 4.060 01/02/2022: ALT 13; BUN 14; Creatinine, Ser 1.00; Hemoglobin 13.2; Magnesium 2.1; Platelets 214; Potassium 4.1; Sodium 136  Recent Lipid Panel    Component Value Date/Time   CHOL 92 (L) 09/22/2021 0956   TRIG 82 09/22/2021 0956   HDL 50 09/22/2021 0956   CHOLHDL 1.8 09/22/2021 0956   CHOLHDL 3.1 11/29/2020 0609   VLDL 11 11/29/2020 0609   LDLCALC 25 09/22/2021 0956    Risk Assessment/Calculations:   CHA2DS2-VASc Score = 5  This indicates a 7.2% annual risk of stroke. The patient's score is based upon: CHF History: 0 HTN History: 1 Diabetes History: 0 Stroke History: 0 Vascular Disease History: 1 Age Score: 2 Gender Score: 1    Home Medications   Current Meds  Medication Sig   acetaminophen (TYLENOL) 500 MG tablet Take 500-1,000 mg by mouth every 6 (six) hours as needed for mild pain (pain).   albuterol (PROVENTIL HFA;VENTOLIN HFA) 108 (90 BASE) MCG/ACT inhaler Inhale 2 puffs into the lungs every 4 (four) hours as needed for wheezing or shortness of breath. Reported on 10/30/2015   apixaban (ELIQUIS) 5 MG TABS tablet Take 5 mg by mouth 2 (two) times daily.   carvedilol (COREG) 6.25 MG tablet TAKE 1 TABLET BY MOUTH TWICE DAILY WITH MEALS   cetirizine (ZYRTEC) 10 MG tablet Take 10 mg by mouth in the morning.   clopidogrel (PLAVIX) 75 MG tablet TAKE 1 TABLET BY MOUTH EVERY MORNING WITH BREAKFAST   Evolocumab (REPATHA SURECLICK) 366 MG/ML SOAJ Inject 140 mg into the skin every 14 (fourteen) days.   fluticasone (FLONASE) 50 MCG/ACT nasal spray Place 1 spray into both nostrils daily.   hydrALAZINE (APRESOLINE) 10 MG tablet Take 1 tablet (10 mg total) by mouth 3 (three) times daily for 14 days.   hydrochlorothiazide (HYDRODIURIL) 25 MG tablet Take 1 tablet (25 mg  total) by mouth daily.   nitroGLYCERIN (NITROSTAT) 0.4 MG SL tablet Place 1 tablet (0.4 mg total) under the tongue every 5 (five) minutes x 3 doses as needed for chest pain.   olmesartan (BENICAR) 40 MG tablet Take  1 tablet (40 mg total) by mouth daily.   senna-docusate (SENOKOT-S) 8.6-50 MG tablet Take 1 tablet by mouth at bedtime as needed.     Review of Systems      All other systems reviewed and are otherwise negative except as noted above.  Physical Exam    VS:  BP (!) 144/60   Pulse (!) 56   Ht '5\' 6"'$  (1.676 m)   Wt 204 lb (92.5 kg)   BMI 32.93 kg/m  , BMI Body mass index is 32.93 kg/m.  Wt Readings from Last 3 Encounters:  01/09/22 204 lb (92.5 kg)  01/02/22 205 lb 0.4 oz (93 kg)  10/06/21 203 lb 12.8 oz (92.4 kg)     GEN: Well nourished, well developed, in no acute distress. HEENT: normal. Neck: Supple, no JVD, carotid bruits, or masses. Cardiac: RRR, no murmurs, rubs, or gallops. No clubbing, cyanosis, edema.  Radials/PT 2+ and equal bilaterally.  Respiratory:  Respirations regular and unlabored, clear to auscultation bilaterally. GI: Soft, nontender, nondistended. MS: No deformity or atrophy. Skin: Warm and dry, no rash.  Midsternal incision clean, dry, intact with no signs of infection. Neuro:  Strength and sensation are intact. Psych: Normal affect.  Assessment & Plan    CAD s/p CABG -GDMT includes aspirin, metoprolol, rosuvastatin.  As she is bradycardic today we will reduce metoprolol to 12.5 mg twice daily.  She is asymptomatic with no lightheadedness, dizziness, near-syncope, syncope.  Stable with no anginal symptoms.  No indication for ischemic evaluation.  Heart healthy diet and regular cardiovascular exercise encouraged.  Needs to participate in cardiac rehab.  With discomfort at site of right pacing wire and is following with cardiothoracic surgery with plans for removal.  HLD, LDL goal <70 -11/29/2020 LDL 94.  01/17/21 LDL 71. Continue rosuvastatin 5 mg  daily.  Continued lifestyle changes encouraged.  DVT /on anticoagulation- DVT 10/2020 though multiple superficial DVTs in the past. Postoperative atrial fibrillation in setting of CABG with CHA2DS2-VASc Score = 5 [CHF History: 0, HTN History: 1, Diabetes History: 0, Stroke History: 0, Vascular Disease History: 1, Age Score: 2, Gender Score: 1].  Therefore, the patient's annual risk of stroke is 7.2 %.  After discussion with Dr. Marlou Porch recommendation for lifelong anticoagulation.  Denies bleeding complications.  Postoperative atrial fibrillation - No palpitations. Amiodarone discontinued 02/06/21 due to bradycardia.  She did have left atrial clip procedure during her CABG, but given history of DVT we will continue anticoagulation.  No evidence of recurrence of atrial fibrillation.  HTN -BP mildly elevated.  Increase hydrochlorothiazide to 25 mg daily and continue home monitoring.  Disposition: Follow up in 4 ***month(s) with Dr. Marlou Porch or APP.  Signed, Loel Dubonnet, NP 01/09/2022, 2:51 PM [facility]

## 2022-01-09 NOTE — Patient Instructions (Addendum)
Medication Instructions:   Your physician has recommended you make the following change in your medication:   STOP Clopidogrel (Plavix)   START Aspirin EC '81mg'$   Continue Hydralazine half tablet twice per day.   Loel Dubonnet, NP will send a MyChart message to check your blood pressure in one week.   *If you need a refill on your cardiac medications before your next appointment, please call your pharmacy*   Lab Work: Your physician recommends that you return for lab work for thyroid panel  If you have labs (blood work) drawn today and your tests are completely normal, you will receive your results only by: MyChart Message (if you have MyChart) OR A paper copy in the mail If you have any lab test that is abnormal or we need to change your treatment, we will call you to review the results.   Testing/Procedures: None ordered today.    Follow-Up: At [facility], you and your health needs are our priority.  As part of our continuing mission to provide you with exceptional heart care, we have created designated Provider Care Teams.  These Care Teams include your primary Cardiologist (physician) and Advanced Practice Providers (APPs -  Physician Assistants and Nurse Practitioners) who all work together to provide you with the care you need, when you need it.  We recommend signing up for the patient portal called "MyChart".  Sign up information is provided on this After Visit Summary.  MyChart is used to connect with patients for Virtual Visits (Telemedicine).  Patients are able to view lab/test results, encounter notes, upcoming appointments, etc.  Non-urgent messages can be sent to your provider as well.   To learn more about what you can do with MyChart, go to [email or web address].    Your next appointment:   As scheduled in September with Dr. Marlou Porch  Other Instructions  Tips to Measure your Blood Pressure Correctly  Here's what you can do to ensure a correct reading:   Don't drink a caffeinated beverage or smoke during the 30 minutes before the test.  Sit quietly for five minutes before the test begins.  During the measurement, sit in a chair with your feet on the floor and your arm supported so your elbow is at about heart level.  The inflatable part of the cuff should completely cover at least 80% of your upper arm, and the cuff should be placed on bare skin, not over a shirt.  Don't talk during the measurement.  Blood pressure categories  Blood pressure category SYSTOLIC (upper number)  DIASTOLIC (lower number)  Normal Less than 120 mm Hg and Less than 80 mm Hg  Elevated 120-129 mm Hg and Less than 80 mm Hg  High blood pressure: Stage 1 hypertension 130-139 mm Hg or 80-89 mm Hg  High blood pressure: Stage 2 hypertension 140 mm Hg or higher or 90 mm Hg or higher  Hypertensive crisis (consult your doctor immediately) Higher than 180 mm Hg and/or Higher than 120 mm Hg  Source: American Heart Association and American Stroke Association. For more on getting your blood pressure under control, buy Controlling Your Blood Pressure, a Special Health Report from [facility].   Blood Pressure Log   Date   Time  Blood Pressure  Example: Nov 1 9 AM 124/78  Heart Healthy Diet Recommendations: A low-salt diet is recommended. Meats should be grilled, baked, or boiled. Avoid fried foods. Focus on lean protein sources like fish or chicken with vegetables and fruits. The American Heart Association is a Microbiologist!  American Heart Association Diet and Lifeystyle Recommendations   Exercise recommendations: The American Heart Association recommends 150 minutes of moderate intensity exercise weekly. Try 30 minutes of moderate intensity exercise 4-5 times per week. This could include walking, jogging, or swimming.  Important Information About Sugar

## 2022-01-10 LAB — THYROID PANEL WITH TSH
Free Thyroxine Index: 2.5 (ref 1.2–4.9)
T3 Uptake Ratio: 28 % (ref 24–39)
T4, Total: 8.9 ug/dL (ref 4.5–12.0)
TSH: 1.33 u[IU]/mL (ref 0.450–4.500)

## 2022-01-22 ENCOUNTER — Encounter (HOSPITAL_BASED_OUTPATIENT_CLINIC_OR_DEPARTMENT_OTHER): Payer: Self-pay

## 2022-02-08 ENCOUNTER — Encounter (HOSPITAL_BASED_OUTPATIENT_CLINIC_OR_DEPARTMENT_OTHER): Payer: Self-pay

## 2022-02-09 MED ORDER — HYDRALAZINE HCL 10 MG PO TABS: 5.0000 mg | ORAL_TABLET | Freq: Two times a day (BID) | ORAL | 1 refills | Status: DC

## 2022-02-18 ENCOUNTER — Other Ambulatory Visit: Payer: Self-pay | Admitting: Cardiology

## 2022-02-18 DIAGNOSIS — I25118 Atherosclerotic heart disease of native coronary artery with other forms of angina pectoris: Secondary | ICD-10-CM

## 2022-02-19 ENCOUNTER — Encounter: Payer: Self-pay | Admitting: Gastroenterology

## 2022-03-04 ENCOUNTER — Other Ambulatory Visit: Payer: Self-pay | Admitting: Cardiology

## 2022-03-16 ENCOUNTER — Encounter: Payer: Self-pay | Admitting: Cardiology

## 2022-03-16 ENCOUNTER — Ambulatory Visit: Payer: Medicare PPO | Attending: Cardiology | Admitting: Cardiology

## 2022-03-16 VITALS — BP 120/50 | HR 50 | Ht 66.0 in | Wt 203.0 lb

## 2022-03-16 DIAGNOSIS — E785 Hyperlipidemia, unspecified: Secondary | ICD-10-CM

## 2022-03-16 DIAGNOSIS — Z86718 Personal history of other venous thrombosis and embolism: Secondary | ICD-10-CM

## 2022-03-16 DIAGNOSIS — I25118 Atherosclerotic heart disease of native coronary artery with other forms of angina pectoris: Secondary | ICD-10-CM

## 2022-03-16 MED ORDER — CARVEDILOL 3.125 MG PO TABS: 3.1250 mg | ORAL_TABLET | Freq: Two times a day (BID) | ORAL | 3 refills | Status: DC

## 2022-03-16 NOTE — Progress Notes (Signed)
Cardiology Office Note:    Date:  03/16/2022   ID:  Krystal Mcpherson, DOB April 01, 1941, MRN [id number]  PCP:  Kristen Loader, FNP   [facility] Providers Cardiologist:  Candee Furbish, MD     Referring MD: Kristen Loader, FNP    History of Present Illness:    Krystal Mcpherson is a 81 y.o. female here for follow-up coronary artery disease PCI to RCA with subsequent CABG.  Has hypertension hyperlipidemia DVT bradycardia postoperatively with atrial fibrillation  She had previous PCI with stenting to the right coronary artery in 2012.     Diagnosed 10/2020 with DVT of  right popliteal and posterior tibial veins - started on Eliquis. 11/2020 NSTEMI cardiac cath showing patent RCA stent with 30% stenosis, mid RCA 50% stenosis, proximal LAD 90% stenosis in the ostium of the diagonal and 80% stenosis, 90% stenosis in first obtuse marginal coronary artery and mid circumflex totally occluded with right to left collaterals.  Echocardiogram with LVEF 55 to 60%, no significant valvular abnormalities.  Carotid duplex showed no evidence of carotid disease bilaterally.  She did require Plavix washout and underwent CABGx3 12/04/20 with Dr. Orvan Seen with left atrail appendage clip (but no MAZE).  Had atrial fibrillation postop. 05/2021 underwent stenting to RCA due to angina.    ED visit 01/02/22 due to hypertensive urgency. Notes Hydralazine started in ED causing diarrhea but tolerating.   Overall she has been doing quite well.  Very active.  Mows her own lawn with 0 turn.  No chest pain.  Past Medical History:  Diagnosis Date   Allergy    Anemia    Hx   Arthritis    neck, lower back   Asthma    Change in bowel habits    CKD (chronic kidney disease), stage III ([place])    Complication of anesthesia    Coronary artery disease    a. 2012 s/p PCI ->RCA; b. 12/2020 CABG x 3 (LIMA->LAD, L Radial->RI->OM); c. 05/2021 NSTEMI/PCI: LM 95, LAD 100p/m, D1 90, LCX 70p, 100p/m, RCA 10ost, 50p ISR, 95/22m(p/m RCA treated w/  3.0x34 Onyx Frontier DES), 3/3 patent grafts.   Dyspnea    GERD (gastroesophageal reflux disease)    HLD (hyperlipidemia)    HTN (hypertension)    Migraine    Myocardial infarction (HCC)    Pneumonia    PONV (postoperative nausea and vomiting)    [place]-operative atrial fibrillation ([place]    a. 12/2020 follwing CABG.   SVD (spontaneous vaginal delivery)    x 2   Vitamin B12 deficiency     Past Surgical History:  Procedure Laterality Date   bladder tact     chiara malformation  2001   surgery for decompression   CHOLECYSTECTOMY     CLIPPING OF ATRIAL APPENDAGE  12/04/2020   Procedure: CLIPPING OF LEFT ATRIAL APPENDAGE;  Surgeon: AWonda Olds MD;  [facility];  Service: Open Heart Surgery;;   COLONOSCOPY  2009    [facility]   cornary stent Right 05/2011   CORONARY ARTERY BYPASS GRAFT N/A 12/04/2020   Procedure: CORONARY ARTERY BYPASS GRAFTING (CABG), ON PUMP, TIMES THREE, USING LEFT INTERNAL MAMMARY ARTERY AND LEFT RADIAL ARTERY;  Surgeon: AWonda Olds MD;  [place]  Service: Open Heart Surgery;  Laterality: N/A;   CORONARY STENT INTERVENTION N/A 05/30/2021   Procedure: CORONARY STENT INTERVENTION;  Surgeon: KTroy Sine MD;  [facility];  Service: Cardiovascular;  Laterality: N/A;  LEFT HEART CATH AND CORONARY ANGIOGRAPHY N/A 11/28/2020   Procedure: LEFT HEART CATH AND CORONARY ANGIOGRAPHY;  Surgeon: Martinique, Peter M, MD;  Location: [place] [facility];  Service: Cardiovascular;  Laterality: N/A;   LEFT HEART CATH AND CORS/GRAFTS ANGIOGRAPHY N/A 05/30/2021   Procedure: LEFT HEART CATH AND CORS/GRAFTS ANGIOGRAPHY;  Surgeon: Troy Sine, MD;  Location: [place] [facility];  Service: Cardiovascular;  Laterality: N/A;   NASAL SINUS SURGERY     x 2   RADIAL ARTERY HARVEST Left 12/04/2020   Procedure: Left RADIAL ARTERY HARVEST;  Surgeon: Wonda Olds, MD;  Location: [place];  Service: Open Heart Surgery;  Laterality: Left;   TEE WITHOUT  CARDIOVERSION N/A 12/04/2020   Procedure: TRANSESOPHAGEAL ECHOCARDIOGRAM (TEE);  Surgeon: Wonda Olds, MD;  Location: [place];  Service: Open Heart Surgery;  Laterality: N/A;   TOTAL ABDOMINAL HYSTERECTOMY W/ BILATERAL SALPINGOOPHORECTOMY      Current Medications: Current Meds  Medication Sig   acetaminophen (TYLENOL) 500 MG tablet Take 500-1,000 mg by mouth every 6 (six) hours as needed for mild pain (pain).   albuterol (PROVENTIL HFA;VENTOLIN HFA) 108 (90 BASE) MCG/ACT inhaler Inhale 2 puffs into the lungs every 4 (four) hours as needed for wheezing or shortness of breath. Reported on 10/30/2015   apixaban (ELIQUIS) 5 MG TABS tablet Take 5 mg by mouth 2 (two) times daily.   carvedilol (COREG) 3.125 MG tablet Take 1 tablet (3.125 mg total) by mouth 2 (two) times daily.   cetirizine (ZYRTEC) 10 MG tablet Take 10 mg by mouth in the morning.   Evolocumab (REPATHA SURECLICK) 749 MG/ML SOAJ Inject 140 mg into the skin every 14 (fourteen) days.   fluticasone (FLONASE) 50 MCG/ACT nasal spray Place 1 spray into both nostrils daily.   hydrALAZINE (APRESOLINE) 10 MG tablet Take 0.5 tablets (5 mg total) by mouth in the morning and at bedtime.   hydrochlorothiazide (HYDRODIURIL) 25 MG tablet TAKE ONE (1) TABLET BY MOUTH EVERY DAY   nitroGLYCERIN (NITROSTAT) 0.4 MG SL tablet Place 1 tablet (0.4 mg total) under the tongue every 5 (five) minutes x 3 doses as needed for chest pain.   olmesartan (BENICAR) 40 MG tablet TAKE ONE (1) TABLET BY MOUTH EVERY DAY   rosuvastatin (CRESTOR) 5 MG tablet Take 1 tablet (5 mg total) by mouth daily.   senna-docusate (SENOKOT-S) 8.6-50 MG tablet Take 1 tablet by mouth at bedtime as needed.   [DISCONTINUED] aspirin EC 81 MG tablet Take 1 tablet (81 mg total) by mouth daily. Swallow whole.   [DISCONTINUED] carvedilol (COREG) 6.25 MG tablet TAKE 1 TABLET BY MOUTH TWICE DAILY WITH MEALS     Allergies:   Metronidazole, Atorvastatin, Ciprofloxacin, Fentanyl, Morphine and  related, Tetanus toxoids, and Augmentin [amoxicillin-pot clavulanate]   Social History   Socioeconomic History   Marital status: Widowed    Spouse name: Not on file   Number of children: Not on file   Years of education: Not on file   Highest education level: Not on file  Occupational History   Not on file  Tobacco Use   Smoking status: Never   Smokeless tobacco: Never  Vaping Use   Vaping Use: Never used  Substance and Sexual Activity   Alcohol use: No    Alcohol/week: 0.0 standard drinks of alcohol   Drug use: No   Sexual activity: Never    Birth control/protection: Post-menopausal  Other Topics Concern   Not on file  Social History Narrative   Not  on file   Social Determinants of Health   Financial Resource Strain: Not on file  Food Insecurity: Not on file  Transportation Needs: Not on file  Physical Activity: Not on file  Stress: Not on file  Social Connections: Not on file     Family History: The patient's family history includes Arthritis in her sister; Breast cancer in her sister; Colon cancer in her paternal grandmother; Heart attack in her father and mother; Hypertension in her father, mother, sister, sister, and sister; Kidney cancer in her sister; Melanoma in her sister; Obesity in her sister; Pancreatitis in her sister; Thyroid cancer in her sister. There is no history of Esophageal cancer, Rectal cancer, or Stomach cancer.  ROS:   Please see the history of present illness.     All other systems reviewed and are negative.  EKGs/Labs/Other Studies Reviewed:    The following studies were reviewed today:   Echo 11/28/2020: 1. Left ventricular ejection fraction, by estimation, is 55 to 60%. The  left ventricle has normal function. The left ventricle demonstrates  regional wall motion abnormalities (see scoring diagram/findings for  description). Left ventricular diastolic  parameters are consistent with Grade I diastolic dysfunction (impaired   relaxation).   2. Right ventricular systolic function is normal. The right ventricular  size is normal. There is normal pulmonary artery systolic pressure. The  estimated right ventricular systolic pressure is 26.8 mmHg.   3. The mitral valve is grossly normal. Trivial mitral valve  regurgitation. No evidence of mitral stenosis.   4. The aortic valve is tricuspid. Aortic valve regurgitation is not  visualized. No aortic stenosis is present.   5. The inferior vena cava is normal in size with greater than 50%  respiratory variability, suggesting right atrial pressure of 3 mmHg.   Left heart cath 11/28/2020: Prox LAD to Mid LAD lesion is 90% stenosed. 1st Diag lesion is 80% stenosed. 1st Mrg lesion is 90% stenosed. Mid Cx lesion is 100% stenosed. Prox RCA lesion is 30% stenosed. Prox RCA to Mid RCA lesion is 50% stenosed. LV end diastolic pressure is mildly elevated.   1. 3 vessel obstructive CAD    - 90% proximal to mid LAD, 80% first diagonal    - 90% large OM1, 100% LCX post OM1 with right to left collaterals.    - Patent stent in the proximal RCA. 50% mid vessel 2. Mildly elevated LVEDP     Recent Labs: 01/02/2022: ALT 13; BUN 14; Creatinine, Ser 1.00; Hemoglobin 13.2; Magnesium 2.1; Platelets 214; Potassium 4.1; Sodium 136 01/09/2022: TSH 1.330  Recent Lipid Panel    Component Value Date/Time   CHOL 92 (L) 09/22/2021 0956   TRIG 82 09/22/2021 0956   HDL 50 09/22/2021 0956   CHOLHDL 1.8 09/22/2021 0956   CHOLHDL 3.1 11/29/2020 0609   VLDL 11 11/29/2020 0609   LDLCALC 25 09/22/2021 0956     Risk Assessment/Calculations:              Physical Exam:    VS:  BP (!) 120/50 (BP Location: Left Arm, Patient Position: Sitting, Cuff Size: Normal)   Pulse (!) 50   Ht '5\' 6"'$  (1.676 m)   Wt 203 lb (92.1 kg)   SpO2 94%   BMI 32.77 kg/m     Wt Readings from Last 3 Encounters:  03/16/22 203 lb (92.1 kg)  01/09/22 204 lb (92.5 kg)  01/02/22 205 lb 0.4 oz (93 kg)      GEN:  Well nourished, well  developed in no acute distress HEENT: Normal NECK: No JVD; No carotid bruits LYMPHATICS: No lymphadenopathy CARDIAC: brady reg, no murmurs, no rubs, gallops RESPIRATORY:  Clear to auscultation without rales, wheezing or rhonchi  ABDOMEN: Soft, non-tender, non-distended MUSCULOSKELETAL:  No edema; No deformity, ecchymoses noted SKIN: Warm and dry NEUROLOGIC:  Alert and oriented x 3 PSYCHIATRIC:  Normal affect   ASSESSMENT:    1. Coronary artery disease of native artery of native heart with stable angina pectoris ([place])   2. Hyperlipidemia LDL goal <70   3. History of DVT (deep vein thrombosis)    PLAN:    In order of problems listed above:  CAD s/p CABG and subsequent DES to RCA -doing very well without any anginal symptoms.  Continue with aspirin 81 mg.  Also on Eliquis.  Actually think would be failure to use Eliquis monotherapy.  She is having a lot of bruising.   HLD, LDL goal <70 -doing well with low-dose carvedilol as well as Repatha.  Last LDL 25  DVT /on anticoagulation- DVT 10/2020 though multiple superficial DVTs in the past. Postoperative atrial fibrillation in setting of CABG with CHA2DS2-VASc Score = 5 [CHF History: 0, HTN History: 1, Diabetes History: 0, Stroke History: 0, Vascular Disease History: 1, Age Score: 2, Gender Score: 1].  Therefore, the patient's annual risk of stroke is 7.2 %.  Continue with lifelong anticoagulation.  On Eliquis 5 mg twice a day.  Denies bleeding complications but she does have a lot of easy bruising, peeling of the skin especially from her dog.  We will stop her aspirin.   Postoperative atrial fibrillation - No palpitations. Amiodarone discontinued 02/06/21 due to bradycardia.  She did have left atrial clip procedure during her CABG, but given history of DVT we will continue anticoagulation.  No evidence of recurrence of atrial fibrillation.  With sinus bradycardia currently we will decrease her carvedilol to 3.125 mg  twice a day.   HTN -interestingly had a side effect of looser stool with hydralazine.  She had been constipated most of her life.  She sees Dr. Fuller Plan with GI.  Continue with current medication management as above.  No changes made..         Medication Adjustments/Labs and Tests Ordered: Current medicines are reviewed at length with the patient today.  Concerns regarding medicines are outlined above.  No orders of the defined types were placed in this encounter.  Meds ordered this encounter  Medications   carvedilol (COREG) 3.125 MG tablet    Sig: Take 1 tablet (3.125 mg total) by mouth 2 (two) times daily.    Dispense:  180 tablet    Refill:  3    Patient Instructions  Medication Instructions:  Please decrease your Carvedilol to 3.125 mg twice a day. Discontinue your Aspirin. Continue all other medications as listed.  *If you need a refill on your cardiac medications before your next appointment, please call your pharmacy*  Follow-Up: At [facility], you and your health needs are our priority.  As part of our continuing mission to provide you with exceptional heart care, we have created designated Provider Care Teams.  These Care Teams include your primary Cardiologist (physician) and Advanced Practice Providers (APPs -  Physician Assistants and Nurse Practitioners) who all work together to provide you with the care you need, when you need it.  We recommend signing up for the patient portal called "MyChart".  Sign up information is provided on this After Visit Summary.  MyChart  is used to connect with patients for Virtual Visits (Telemedicine).  Patients are able to view lab/test results, encounter notes, upcoming appointments, etc.  Non-urgent messages can be sent to your provider as well.   To learn more about what you can do with MyChart, go to [email or web address].    Your next appointment:   6 month(s)  The format for your next appointment:   In  Person  Provider:   Robbie Lis, PA-C, Nicholes Rough, PA-C, Ambrose Pancoast, NP, Ermalinda Barrios, PA-C, Christen Bame, NP, or Richardson Dopp, PA-C         Important Information About Sugar         Signed, Candee Furbish, MD  03/16/2022 11:50 AM    [place]

## 2022-03-16 NOTE — Patient Instructions (Signed)
Medication Instructions:  Please decrease your Carvedilol to 3.125 mg twice a day. Discontinue your Aspirin. Continue all other medications as listed.  *If you need a refill on your cardiac medications before your next appointment, please call your pharmacy*  Follow-Up: At [facility], you and your health needs are our priority.  As part of our continuing mission to provide you with exceptional heart care, we have created designated Provider Care Teams.  These Care Teams include your primary Cardiologist (physician) and Advanced Practice Providers (APPs -  Physician Assistants and Nurse Practitioners) who all work together to provide you with the care you need, when you need it.  We recommend signing up for the patient portal called "MyChart".  Sign up information is provided on this After Visit Summary.  MyChart is used to connect with patients for Virtual Visits (Telemedicine).  Patients are able to view lab/test results, encounter notes, upcoming appointments, etc.  Non-urgent messages can be sent to your provider as well.   To learn more about what you can do with MyChart, go to [email or web address].    Your next appointment:   6 month(s)  The format for your next appointment:   In Person  Provider:   Robbie Lis, PA-C, Nicholes Rough, PA-C, Ambrose Pancoast, NP, Ermalinda Barrios, PA-C, Christen Bame, NP, or Richardson Dopp, PA-C         Important Information About Sugar

## 2022-03-18 DIAGNOSIS — Z789 Other specified health status: Secondary | ICD-10-CM | POA: Diagnosis not present

## 2022-03-18 DIAGNOSIS — D1801 Hemangioma of skin and subcutaneous tissue: Secondary | ICD-10-CM | POA: Diagnosis not present

## 2022-03-18 DIAGNOSIS — L820 Inflamed seborrheic keratosis: Secondary | ICD-10-CM | POA: Diagnosis not present

## 2022-03-18 DIAGNOSIS — L281 Prurigo nodularis: Secondary | ICD-10-CM | POA: Diagnosis not present

## 2022-03-18 DIAGNOSIS — L821 Other seborrheic keratosis: Secondary | ICD-10-CM | POA: Diagnosis not present

## 2022-03-18 DIAGNOSIS — D692 Other nonthrombocytopenic purpura: Secondary | ICD-10-CM | POA: Diagnosis not present

## 2022-03-18 DIAGNOSIS — L814 Other melanin hyperpigmentation: Secondary | ICD-10-CM | POA: Diagnosis not present

## 2022-03-18 DIAGNOSIS — R208 Other disturbances of skin sensation: Secondary | ICD-10-CM | POA: Diagnosis not present

## 2022-04-10 DIAGNOSIS — R509 Fever, unspecified: Secondary | ICD-10-CM | POA: Diagnosis not present

## 2022-04-10 DIAGNOSIS — R070 Pain in throat: Secondary | ICD-10-CM | POA: Diagnosis not present

## 2022-04-10 DIAGNOSIS — H6501 Acute serous otitis media, right ear: Secondary | ICD-10-CM | POA: Diagnosis not present

## 2022-04-10 DIAGNOSIS — R5383 Other fatigue: Secondary | ICD-10-CM | POA: Diagnosis not present

## 2022-04-10 DIAGNOSIS — J029 Acute pharyngitis, unspecified: Secondary | ICD-10-CM | POA: Diagnosis not present

## 2022-05-06 ENCOUNTER — Ambulatory Visit: Payer: Medicare PPO | Admitting: Gastroenterology

## 2022-05-15 ENCOUNTER — Other Ambulatory Visit (HOSPITAL_BASED_OUTPATIENT_CLINIC_OR_DEPARTMENT_OTHER): Payer: Self-pay | Admitting: Family

## 2022-05-15 NOTE — Telephone Encounter (Signed)
Rx(s) sent to pharmacy electronically.  

## 2022-05-20 DIAGNOSIS — M545 Low back pain, unspecified: Secondary | ICD-10-CM | POA: Diagnosis not present

## 2022-05-20 DIAGNOSIS — N390 Urinary tract infection, site not specified: Secondary | ICD-10-CM | POA: Diagnosis not present

## 2022-05-20 DIAGNOSIS — Z23 Encounter for immunization: Secondary | ICD-10-CM | POA: Diagnosis not present

## 2022-05-20 DIAGNOSIS — N183 Chronic kidney disease, stage 3 unspecified: Secondary | ICD-10-CM | POA: Diagnosis not present

## 2022-05-20 DIAGNOSIS — D6869 Other thrombophilia: Secondary | ICD-10-CM | POA: Diagnosis not present

## 2022-05-20 DIAGNOSIS — G8929 Other chronic pain: Secondary | ICD-10-CM | POA: Diagnosis not present

## 2022-05-20 DIAGNOSIS — I10 Essential (primary) hypertension: Secondary | ICD-10-CM | POA: Diagnosis not present

## 2022-05-20 DIAGNOSIS — Z6833 Body mass index (BMI) 33.0-33.9, adult: Secondary | ICD-10-CM | POA: Diagnosis not present

## 2022-05-20 DIAGNOSIS — M25562 Pain in left knee: Secondary | ICD-10-CM | POA: Diagnosis not present

## 2022-05-26 DIAGNOSIS — L84 Corns and callosities: Secondary | ICD-10-CM | POA: Diagnosis not present

## 2022-05-26 DIAGNOSIS — I70203 Unspecified atherosclerosis of native arteries of extremities, bilateral legs: Secondary | ICD-10-CM | POA: Diagnosis not present

## 2022-06-24 DIAGNOSIS — Z6833 Body mass index (BMI) 33.0-33.9, adult: Secondary | ICD-10-CM | POA: Diagnosis not present

## 2022-06-24 DIAGNOSIS — R300 Dysuria: Secondary | ICD-10-CM | POA: Diagnosis not present

## 2022-06-25 ENCOUNTER — Ambulatory Visit: Payer: Medicare PPO | Admitting: Gastroenterology

## 2022-06-25 ENCOUNTER — Encounter: Payer: Self-pay | Admitting: Gastroenterology

## 2022-06-25 VITALS — BP 132/78 | HR 85 | Ht 66.0 in | Wt 201.0 lb

## 2022-06-25 DIAGNOSIS — Z800 Family history of malignant neoplasm of digestive organs: Secondary | ICD-10-CM

## 2022-06-25 DIAGNOSIS — K5909 Other constipation: Secondary | ICD-10-CM

## 2022-06-25 DIAGNOSIS — Z951 Presence of aortocoronary bypass graft: Secondary | ICD-10-CM | POA: Diagnosis not present

## 2022-06-25 NOTE — Progress Notes (Signed)
Assessment    Family history of colon cancer, multiple second-degree relatives.   Chronic constipation, currently well-controlled CAD, S/P CABG  Recommendations   Given her age, comorbidities and findings on last colonoscopy we discussed the options of proceeding with another colonoscopy or discontinuing colonoscopies.  She prefers to discontinue screening colonoscopies. Follow-up with PCP and GI follow-up as needed   HPI   Chief complaint: [facility]  Patient profile:  Krystal Mcpherson is a 81 y.o. female referred by Krystal Ledger, FNP for a family history of colon cancer. She has chronic constipation which has recently been under good control.  No active GI complaints.  Family history is remarkable for a PGM and 5 paternal cousins with colon cancer.  Her last colonoscopy as below showed only 1 small hyperplastic polyp.  She has coronary artery disease status post PCI and CABG. Denies weight loss, abdominal pain, diarrhea, change in stool caliber, melena, hematochezia, nausea, vomiting, dysphagia, reflux symptoms, chest pain.   Previous Labs / Imaging::    Latest Ref Rng & Units 01/02/2022    2:10 PM 10/06/2021   12:37 PM 05/31/2021    2:32 AM  CBC  WBC 4.0 - 10.5 K/uL 5.4  4.8  6.5   Hemoglobin 12.0 - 15.0 g/dL 13.2  12.3  12.2   Hematocrit 36.0 - 46.0 % 40.6  37.8  37.8   Platelets 150 - 400 K/uL 214  223  178     No results found for: "LIPASE"    Latest Ref Rng & Units 01/02/2022    2:10 PM 10/06/2021   12:37 PM 09/22/2021    9:56 AM  CMP  Glucose 70 - 99 mg/dL 89  93    BUN 8 - 23 mg/dL 14  18    Creatinine 0.44 - 1.00 mg/dL 1.00  0.98    Sodium 135 - 145 mmol/L 136  140    Potassium 3.5 - 5.1 mmol/L 4.1  4.5    Chloride 98 - 111 mmol/L 99  103    CO2 22 - 32 mmol/L 29  26    Calcium 8.9 - 10.3 mg/dL 9.3  9.4    Total Protein 6.5 - 8.1 g/dL 5.8   6.0   Total Bilirubin 0.3 - 1.2 mg/dL 0.8   0.4   Alkaline Phos 38 - 126 U/L 30   39   AST 15 - 41 U/L 18   19   ALT 0 - 44 U/L  13   10      Previous GI evaluation    Endoscopies:  Colonoscopy May 2017 - One 6 mm polyp in the transverse colon, removed with a cold snare. Resected and retrieved. Hyperplastic - Moderate diverticulosis in the sigmoid colon. - Internal hemorrhoids.  Imaging:     Past Medical History:  Diagnosis Date   Allergy    Anemia    Hx   Arthritis    neck, lower back   Asthma    Change in bowel habits    CKD (chronic kidney disease), stage III ([place])    Complication of anesthesia    Coronary artery disease    a. 2012 s/p PCI ->RCA; b. 12/2020 CABG x 3 (LIMA->LAD, L Radial->RI->OM); c. 05/2021 NSTEMI/PCI: LM 95, LAD 100p/m, D1 90, LCX 70p, 100p/m, RCA 10ost, 50p ISR, 95/9m(p/m RCA treated w/ 3.0x34 Onyx Frontier DES), 3/3 patent grafts.   Dyspnea    GERD (gastroesophageal reflux disease)    HLD (hyperlipidemia)  HTN (hypertension)    Migraine    Myocardial infarction (HCC)    Pneumonia    PONV (postoperative nausea and vomiting)    Post-operative atrial fibrillation ([place])    a. 12/2020 follwing CABG.   SVD (spontaneous vaginal delivery)    x 2   Vitamin B12 deficiency    Past Surgical History:  Procedure Laterality Date   bladder tact     chiara malformation  2001   surgery for decompression   CHOLECYSTECTOMY     CLIPPING OF ATRIAL APPENDAGE  12/04/2020   Procedure: CLIPPING OF LEFT ATRIAL APPENDAGE;  Surgeon: Wonda Olds, MD;  [facility];  Service: Open Heart Surgery;;   COLONOSCOPY  2009    Krystal Mcpherson   cornary stent Right 05/2011   CORONARY ARTERY BYPASS GRAFT N/A 12/04/2020   Procedure: CORONARY ARTERY BYPASS GRAFTING (CABG), ON PUMP, TIMES THREE, USING LEFT INTERNAL MAMMARY ARTERY AND LEFT RADIAL ARTERY;  Surgeon: Wonda Olds, MD;  Location: [place];  Service: Open Heart Surgery;  Laterality: N/A;   CORONARY STENT INTERVENTION N/A 05/30/2021   Procedure: CORONARY STENT INTERVENTION;  Surgeon: Troy Sine, MD;  Location: [place] [facility];  Service:  Cardiovascular;  Laterality: N/A;   LEFT HEART CATH AND CORONARY ANGIOGRAPHY N/A 11/28/2020   Procedure: LEFT HEART CATH AND CORONARY ANGIOGRAPHY;  Surgeon: Martinique, Peter M, MD;  Location: [place] [facility];  Service: Cardiovascular;  Laterality: N/A;   LEFT HEART CATH AND CORS/GRAFTS ANGIOGRAPHY N/A 05/30/2021   Procedure: LEFT HEART CATH AND CORS/GRAFTS ANGIOGRAPHY;  Surgeon: Troy Sine, MD;  Location: [place] [facility];  Service: Cardiovascular;  Laterality: N/A;   NASAL SINUS SURGERY     x 2   RADIAL ARTERY HARVEST Left 12/04/2020   Procedure: Left RADIAL ARTERY HARVEST;  Surgeon: Wonda Olds, MD;  Location: [place];  Service: Open Heart Surgery;  Laterality: Left;   TEE WITHOUT CARDIOVERSION N/A 12/04/2020   Procedure: TRANSESOPHAGEAL ECHOCARDIOGRAM (TEE);  Surgeon: Wonda Olds, MD;  Location: [place];  Service: Open Heart Surgery;  Laterality: N/A;   TOTAL ABDOMINAL HYSTERECTOMY W/ BILATERAL SALPINGOOPHORECTOMY     Family History  Problem Relation Age of Onset   Colon cancer Paternal Grandmother    Hypertension Mother    Heart attack Mother    Hypertension Father    Heart attack Father    Arthritis Sister    Obesity Sister    Hypertension Sister    Thyroid cancer Sister    Kidney cancer Sister    Breast cancer Sister    Hypertension Sister    Pancreatitis Sister    Melanoma Sister    Hypertension Sister    Esophageal cancer Neg Hx    Rectal cancer Neg Hx    Stomach cancer Neg Hx    Social History   Tobacco Use   Smoking status: Never   Smokeless tobacco: Never  Vaping Use   Vaping Use: Never used  Substance Use Topics   Alcohol use: No    Alcohol/week: 0.0 standard drinks of alcohol   Drug use: No   Current Outpatient Medications  Medication Sig Dispense Refill   acetaminophen (TYLENOL) 500 MG tablet Take 500-1,000 mg by mouth every 6 (six) hours as needed for mild pain (pain).     albuterol (PROVENTIL HFA;VENTOLIN HFA) 108 (90 BASE) MCG/ACT inhaler  Inhale 2 puffs into the lungs every 4 (four) hours as needed for wheezing or shortness of breath. Reported on 10/30/2015  apixaban (ELIQUIS) 5 MG TABS tablet Take 5 mg by mouth 2 (two) times daily.     carvedilol (COREG) 3.125 MG tablet Take 1 tablet (3.125 mg total) by mouth 2 (two) times daily. 180 tablet 3   cetirizine (ZYRTEC) 10 MG tablet Take 10 mg by mouth in the morning.     Evolocumab (REPATHA SURECLICK) 817 MG/ML SOAJ Inject 140 mg into the skin every 14 (fourteen) days. 6 mL 3   fluticasone (FLONASE) 50 MCG/ACT nasal spray Place 1 spray into both nostrils daily.     hydrALAZINE (APRESOLINE) 10 MG tablet TAKE 1/2 TABLET BY MOUTH EVERY MORNING &AT BEDTIME 90 tablet 2   hydrochlorothiazide (HYDRODIURIL) 25 MG tablet TAKE ONE (1) TABLET BY MOUTH EVERY DAY 90 tablet 3   nitroGLYCERIN (NITROSTAT) 0.4 MG SL tablet Place 1 tablet (0.4 mg total) under the tongue every 5 (five) minutes x 3 doses as needed for chest pain. 25 tablet 3   olmesartan (BENICAR) 40 MG tablet TAKE ONE (1) TABLET BY MOUTH EVERY DAY 90 tablet 3   rosuvastatin (CRESTOR) 5 MG tablet Take 1 tablet (5 mg total) by mouth daily. 90 tablet 3   senna-docusate (SENOKOT-S) 8.6-50 MG tablet Take 1 tablet by mouth at bedtime as needed.     No current facility-administered medications for this visit.   Allergies  Allergen Reactions   Metronidazole Hives   Atorvastatin     Muscle pain    Ciprofloxacin Hives   Fentanyl Hives    Itching all over    Morphine And Related     Hallucinations    Tetanus Toxoids     Localized knot    Augmentin [Amoxicillin-Pot Clavulanate] Nausea And Vomiting    Pt has taken Keflex (cephalexin) without adverse reaction. Started 08/06/16    Review of Systems: All other systems reviewed and negative except where noted in HPI.    Physical Exam    Wt Readings from Last 3 Encounters:  06/25/22 201 lb (91.2 kg)  03/16/22 203 lb (92.1 kg)  01/09/22 204 lb (92.5 kg)    BP 132/78   Pulse 85    Ht '5\' 6"'$  (1.676 m)   Wt 201 lb (91.2 kg)   BMI 32.44 kg/m  Constitutional:  Generally well appearing female in no acute distress. HEENT: Pupils normal.  Conjunctivae are normal. No scleral icterus. No oral lesions or deformities noted.  Neck: Supple.  Cardiac: Normal rate, regular rhythm without murmurs. Pulmonary/chest: Effort normal and breath sounds normal. No wheezing, rales or rhonchi. Abdominal: Soft, nondistended, nontender. Active bowel sounds. No palpable HSM, masses or hernias. Rectal:  Not done Extremities: No edema or deformities noted Neurological: Alert and oriented to person, place and time. Psychiatric: Pleasant. Normal mood and affect. Behavior is normal. Skin: Skin is warm and dry. No rashes noted.  Lucio Edward, MD   cc:  Referring Provider Kristen Loader, FNP

## 2022-06-25 NOTE — Patient Instructions (Signed)
Follow up as needed.   The [facility] providers would like to encourage you to use [facility] to communicate with providers for non-urgent requests or questions.  Due to long hold times on the telephone, sending your provider a message by [facility] may be a faster and more efficient way to get a response.  Please allow 48 business hours for a response.  Please remember that this is for non-urgent requests.   Thank you for choosing me and [facility].  Pricilla Riffle. Dagoberto Ligas., MD., Marval Regal

## 2022-07-15 ENCOUNTER — Other Ambulatory Visit (HOSPITAL_COMMUNITY): Payer: Self-pay

## 2022-07-15 DIAGNOSIS — N644 Mastodynia: Secondary | ICD-10-CM | POA: Diagnosis not present

## 2022-07-17 ENCOUNTER — Other Ambulatory Visit: Payer: Self-pay | Admitting: Obstetrics and Gynecology

## 2022-07-17 DIAGNOSIS — N644 Mastodynia: Secondary | ICD-10-CM

## 2022-09-02 DIAGNOSIS — E785 Hyperlipidemia, unspecified: Secondary | ICD-10-CM | POA: Diagnosis not present

## 2022-09-02 DIAGNOSIS — D6869 Other thrombophilia: Secondary | ICD-10-CM | POA: Diagnosis not present

## 2022-09-02 DIAGNOSIS — I10 Essential (primary) hypertension: Secondary | ICD-10-CM | POA: Diagnosis not present

## 2022-09-04 ENCOUNTER — Other Ambulatory Visit: Payer: Self-pay | Admitting: Obstetrics and Gynecology

## 2022-09-04 DIAGNOSIS — N644 Mastodynia: Secondary | ICD-10-CM

## 2022-09-09 ENCOUNTER — Ambulatory Visit
Admission: RE | Admit: 2022-09-09 | Discharge: 2022-09-09 | Disposition: A | Discharge status: Home / Self Care | Payer: Medicare PPO | Source: Ambulatory Visit | Attending: Obstetrics and Gynecology | Admitting: Obstetrics and Gynecology

## 2022-09-09 DIAGNOSIS — N644 Mastodynia: Secondary | ICD-10-CM

## 2022-09-21 DIAGNOSIS — M25562 Pain in left knee: Secondary | ICD-10-CM | POA: Diagnosis not present

## 2022-09-25 ENCOUNTER — Telehealth: Payer: Self-pay

## 2022-09-25 NOTE — Patient Instructions (Signed)
Visit Information  Thank you for taking time to visit with me today. Please don't hesitate to contact me if I can be of assistance to you.   Following are the goals we discussed today:   Goals Addressed             This Visit's Progress    COMPLETED: Care Coordination Activities- No Follow Up Required       Interventions Today    Flowsheet Row Most Recent Value  General Interventions   General Interventions Discussed/Reviewed General Interventions Discussed, Doctor Visits  [declines any further RNCM follow up needs]  Doctor Visits Discussed/Reviewed Doctor Visits Discussed, Annual Wellness Visits, PCP  Education Interventions   Education Provided Provided Education  Provided Verbal Education On Other  [care coordination services]               If you are experiencing a Mental Health or [place] or need someone to talk to, please call the Suicide and Crisis Lifeline: 988 call the [place] National Suicide Prevention Lifeline: 760 249 5337 or TTY: 270 129 2655 [place] [phone]) to talk to a trained counselor call 1-800-273-TALK (toll free, 24 hour hotline) go to [facility] [place] [phone]) call 911   Patient verbalizes understanding of instructions and care plan provided today and agrees to view in [place]. Active MyChart status and patient understanding of how to access instructions and care plan via MyChart confirmed with patient.     No further follow up required:    [place], Jackquline Denmark, [place] [facility] [phone]

## 2022-09-25 NOTE — Patient Outreach (Signed)
  Care Coordination   Initial Visit Note   09/25/2022 Name: ICELYN EICHE MRN: [id number] DOB: 04-Oct-1940  LUNDIN KOPELMAN is a 82 y.o. year old female who sees Kristen Loader, FNP for primary care. I spoke with  Jacquelin Hawking by phone today.  What matters to the patients health and wellness today?  No concerns today    Goals Addressed             This Visit's Progress    COMPLETED: Care Coordination Activities- No Follow Up Required       Interventions Today    Flowsheet Row Most Recent Value  General Interventions   General Interventions Discussed/Reviewed General Interventions Discussed, Doctor Visits  [declines any further RNCM follow up needs]  Doctor Visits Discussed/Reviewed Doctor Visits Discussed, Annual Wellness Visits, PCP  Education Interventions   Education Provided Provided Education  Provided Verbal Education On Other  [care coordination services]              SDOH assessments and interventions completed:  Yes  SDOH Interventions Today    Flowsheet Row Most Recent Value  SDOH Interventions   Food Insecurity Interventions Intervention Not Indicated  Housing Interventions Intervention Not Indicated  Transportation Interventions Intervention Not Indicated  Utilities Interventions Intervention Not Indicated        Care Coordination Interventions:  Yes, provided   Follow up plan: No further intervention required.   Encounter Outcome:  Pt. Visit Completed  Peter Garter RN, BSN,CCM, CDE Care Management Coordinator [place] [facility] [phone]

## 2022-11-02 DIAGNOSIS — M170 Bilateral primary osteoarthritis of knee: Secondary | ICD-10-CM | POA: Diagnosis not present

## 2022-11-10 ENCOUNTER — Other Ambulatory Visit: Payer: Self-pay | Admitting: Cardiology

## 2022-11-17 DIAGNOSIS — I25118 Atherosclerotic heart disease of native coronary artery with other forms of angina pectoris: Secondary | ICD-10-CM | POA: Diagnosis not present

## 2022-11-17 DIAGNOSIS — K760 Fatty (change of) liver, not elsewhere classified: Secondary | ICD-10-CM | POA: Diagnosis not present

## 2022-11-17 DIAGNOSIS — Z0000 Encounter for general adult medical examination without abnormal findings: Secondary | ICD-10-CM | POA: Diagnosis not present

## 2022-11-17 DIAGNOSIS — I5030 Unspecified diastolic (congestive) heart failure: Secondary | ICD-10-CM | POA: Diagnosis not present

## 2022-11-17 DIAGNOSIS — M79602 Pain in left arm: Secondary | ICD-10-CM | POA: Diagnosis not present

## 2022-11-17 DIAGNOSIS — I130 Hypertensive heart and chronic kidney disease with heart failure and stage 1 through stage 4 chronic kidney disease, or unspecified chronic kidney disease: Secondary | ICD-10-CM | POA: Diagnosis not present

## 2022-11-17 DIAGNOSIS — E538 Deficiency of other specified B group vitamins: Secondary | ICD-10-CM | POA: Diagnosis not present

## 2022-11-17 DIAGNOSIS — E559 Vitamin D deficiency, unspecified: Secondary | ICD-10-CM | POA: Diagnosis not present

## 2022-11-17 DIAGNOSIS — T466X5A Adverse effect of antihyperlipidemic and antiarteriosclerotic drugs, initial encounter: Secondary | ICD-10-CM | POA: Diagnosis not present

## 2022-11-17 DIAGNOSIS — M79606 Pain in leg, unspecified: Secondary | ICD-10-CM | POA: Diagnosis not present

## 2022-11-17 DIAGNOSIS — I10 Essential (primary) hypertension: Secondary | ICD-10-CM | POA: Diagnosis not present

## 2022-11-17 DIAGNOSIS — D6869 Other thrombophilia: Secondary | ICD-10-CM | POA: Diagnosis not present

## 2022-11-17 DIAGNOSIS — N1831 Chronic kidney disease, stage 3a: Secondary | ICD-10-CM | POA: Diagnosis not present

## 2022-11-17 DIAGNOSIS — G720 Drug-induced myopathy: Secondary | ICD-10-CM | POA: Diagnosis not present

## 2022-11-18 ENCOUNTER — Telehealth: Payer: Self-pay

## 2022-11-18 NOTE — Telephone Encounter (Signed)
Appointment Request From: Krystal Mcpherson   With Provider: Alver Sorrow, NP [facility] [address]]   Preferred Date Range: 11/18/2022 - 11/23/2022   Preferred Times: Any Time   Reason for visit: Office Visit   Comments: EKG at [facility] office on 11/17/2022 showed slow heart rate.  pain in left arm and swelling of legs and feet.  He suggested scheduling appointment for possible ecocardiogram.    Spoke to the patient, she is currently asymptomatic. Pt preferred to be seen by Lily Kocher however, she does not have any availability until the beginning of June . Pt agreed to be seen by another APP, pt is scheduled on 5/17 @ 1120 with APP at the [address] location. Explained ED precautions, patient voiced understanding.

## 2022-11-19 NOTE — Progress Notes (Signed)
Office Visit    Patient Name: Krystal Mcpherson Date of Encounter: 11/20/2022  PCP:  Soundra Pilon, FNP   [facility]  Cardiologist:  Donato Schultz, MD  Advanced Practice Provider:  No care team member to display Electrophysiologist:  None   HPI    Krystal Mcpherson is a 82 y.o. female with a past medical history of coronary artery disease status post PCI to RCA with subsequent CABG, hypertension, hyperlipidemia, history of DVT, bradycardia postoperatively with atrial fibrillation presents today for follow-up appointment.  Previous PCI with stenting of the right coronary artery in 2012.  Diagnosed 10/2020 with DVT of the right popliteal and posterior tibial veins, started on Eliquis.  11/2020 patient had NSTEMI, cardiac cath showing patent RCA stent with 30% stenosis, mid RCA 50% stenosis, mid LAD 90% stenosis in the ostium of the diagonal and 80% stenosis, 90% stenosis in the first obtuse marginal coronary artery and mid circumflex totally occluded with right to left collaterals.  Echo with LVEF 55 to 60%, no significant valvular abnormalities.  Carotid duplex showed no evidence of carotid artery disease bilaterally.  She did require Plavix without washout and underwent CABG times 36/1/22 with Dr. Renaldo Fiddler with left atrial appendage clip (but no maze).  Had atrial fibrillation postop.  11/22 underwent stenting of the RCA due to angina.  ED visit 01/02/2022 due to hypertensive urgency.  Noted hydralazine started in the ED causing diarrhea with tolerating.  At her last visit 03/16/2022 she had been doing well at that time.  Very active.  Mowed her own lawn with a 0 turn lawnmower.  No chest pain. No hospital   Today, she tell me that she went to PCP Monday, HR was low that time. Left arm discomfort. Ankles have been swelling more.  Labs were done and her BNP was elevated at 150.  From what I can see from his labs A1c was 5.8 creatinine 1.08, LDL 30, potassium 4.3, triglycerides 107.   When she had her surgery back in 2022 her anginal equivalent was extreme indigestion and high blood pressure.  She has not had either of these things since then.  When she went to her primary EKG looked okay at that time.  She does have occasional palpitations but nothing that lasts for long  Reports no shortness of breath nor dyspnea on exertion. Reports no chest pain, pressure, or tightness. No orthopnea, PND.   Past Medical History    Past Medical History:  Diagnosis Date   Allergy    Anemia    Hx   Arthritis    neck, lower back   Asthma    Change in bowel habits    CKD (chronic kidney disease), stage III (HCC)    Complication of anesthesia    Coronary artery disease    a. 2012 s/p PCI ->RCA; b. 12/2020 CABG x 3 (LIMA->LAD, L Radial->RI->OM); c. 05/2021 NSTEMI/PCI: LM 95, LAD 100p/m, D1 90, LCX 70p, 100p/m, RCA 10ost, 50p ISR, 95/59m (p/m RCA treated w/ 3.0x34 Onyx Frontier DES), 3/3 patent grafts.   Dyspnea    GERD (gastroesophageal reflux disease)    HLD (hyperlipidemia)    HTN (hypertension)    Migraine    Myocardial infarction (HCC)    Pneumonia    PONV (postoperative nausea and vomiting)    Post-operative atrial fibrillation (HCC)    a. 12/2020 follwing CABG.   SVD (spontaneous vaginal delivery)    x 2   Vitamin B12 deficiency  Past Surgical History:  Procedure Laterality Date   bladder tact     chiara malformation  2001   surgery for decompression   CHOLECYSTECTOMY     CLIPPING OF ATRIAL APPENDAGE  12/04/2020   Procedure: CLIPPING OF LEFT ATRIAL APPENDAGE;  Surgeon: Linden Dolin, MD;  Location: [facility] OR;  Service: Open Heart Surgery;;   COLONOSCOPY  2009    stark   cornary stent Right 05/2011   CORONARY ARTERY BYPASS GRAFT N/A 12/04/2020   Procedure: CORONARY ARTERY BYPASS GRAFTING (CABG), ON PUMP, TIMES THREE, USING LEFT INTERNAL MAMMARY ARTERY AND LEFT RADIAL ARTERY;  Surgeon: Linden Dolin, MD;  Location: [facility] OR;  Service: Open Heart Surgery;  Laterality:  N/A;   CORONARY STENT INTERVENTION N/A 05/30/2021   Procedure: CORONARY STENT INTERVENTION;  Surgeon: Lennette Bihari, MD;  Location: [facility] INVASIVE CV LAB;  Service: Cardiovascular;  Laterality: N/A;   LEFT HEART CATH AND CORONARY ANGIOGRAPHY N/A 11/28/2020   Procedure: LEFT HEART CATH AND CORONARY ANGIOGRAPHY;  Surgeon: Swazil[place], Peter M, MD;  Location: [facility] INVASIVE CV LAB;  Service: Cardiovascular;  Laterality: N/A;   LEFT HEART CATH AND CORS/GRAFTS ANGIOGRAPHY N/A 05/30/2021   Procedure: LEFT HEART CATH AND CORS/GRAFTS ANGIOGRAPHY;  Surgeon: Lennette Bihari, MD;  Location: [facility] INVASIVE CV LAB;  Service: Cardiovascular;  Laterality: N/A;   NASAL SINUS SURGERY     x 2   RADIAL ARTERY HARVEST Left 12/04/2020   Procedure: Left RADIAL ARTERY HARVEST;  Surgeon: Linden Dolin, MD;  Location: [facility] OR;  Service: Open Heart Surgery;  Laterality: Left;   TEE WITHOUT CARDIOVERSION N/A 12/04/2020   Procedure: TRANSESOPHAGEAL ECHOCARDIOGRAM (TEE);  Surgeon: Linden Dolin, MD;  Location: [facility] OR;  Service: Open Heart Surgery;  Laterality: N/A;   TOTAL ABDOMINAL HYSTERECTOMY W/ BILATERAL SALPINGOOPHORECTOMY      Allergies  Allergies  Allergen Reactions   Metronidazole Hives   Atorvastatin     Muscle pain    Ciprofloxacin Hives   Fentanyl Hives    Itching all over    Morphine And Codeine     Hallucinations    Tetanus Toxoids     Localized knot    Augmentin [Amoxicillin-Pot Clavulanate] Nausea And Vomiting    Pt has taken Keflex (cephalexin) without adverse reaction. Started 08/06/16     EKGs/Labs/Other Studies Reviewed:   The following studies were reviewed today: Cardiac Studies & Procedures   CARDIAC CATHETERIZATION  CARDIAC CATHETERIZATION 05/30/2021  Narrative   Dist LM to Prox LAD lesion is 95% stenosed.   Prox LAD to Mid LAD lesion is 100% stenosed.   1st Diag lesion is 90% stenosed.   Prox Cx to Mid Cx lesion is 100% stenosed.   Prox RCA lesion is 50% stenosed.   Ost RCA lesion  is 10% stenosed.   Mid RCA-1 lesion is 95% stenosed.   Mid RCA-2 lesion is 40% stenosed.   Prox Cx lesion is 70% stenosed.   A drug-eluting stent was successfully placed.   Post intervention, there is a 0% residual stenosis.   Post intervention, there is a 0% residual stenosis.   Post intervention, there is a 0% residual stenosis.  Significant native CAD with total occlusion of the proximal LAD, 70% stenosis in the proximal stenosis prior to the sequential limb of the left radial artery conduit supplying 2 branches.  The circumflex is unchanged and occluded after the initial branch; and progressive native RCA disease with 50% in-stent narrowing focally within the mid stent followed by  95% stenosis immediately after the distal aspect of the stent followed by 40% stenosis proximal to the acute margin.  Patent sequential left radial artery graft supplying a reported ramus intermediate and distal circumflex marginal branch.  Patent LIMA graft supplying the mid LAD.  LVEDP 8 mmHg.  Successful percutaneous coronary intervention to the RCA with predilatation of the 95% stenosis with a 2.0 x 12 mm balloon and ultimate stenting proximal to the 50% in-stent stenosis region to distally near the acute margin with insertion of a 3.0 x 34 mm Onyx Frontier DES stent postdilated to 3.25 - 3.21 mm.  RECOMMENDATION: DAPT.  In the laboratory the patient was given Brilinta.  If Eliquis is to be restarted this will need to be changed to clopidogrel with plans for initial triple drug therapy for 1 month, followed by Plavix/Eliquis.  Optimal blood pressure control and aggressive lipid management.  Findings Coronary Findings Diagnostic  Dominance: Right  Left Main Dist LM to Prox LAD lesion is 95% stenosed.  Left Anterior Descending Prox LAD to Mid LAD lesion is 100% stenosed.  First Diagonal Branch Vessel is small in size. 1st Diag lesion is 90% stenosed.  Left Circumflex Prox Cx lesion is 70%  stenosed. Prox Cx to Mid Cx lesion is 100% stenosed.  Right Coronary Artery Ost RCA lesion is 10% stenosed. Prox RCA lesion is 50% stenosed. The lesion was previously treated . Mid RCA-1 lesion is 95% stenosed. Mid RCA-2 lesion is 40% stenosed.  Sequential Graft To 1st Mrg, 3rd Mrg  LIMA Graft To Mid LAD  Intervention  Prox RCA lesion Stent (Also treats lesions: Mid RCA-1, and Mid RCA-2) A drug-eluting stent was successfully placed. Stent strut is well apposed. Post-Intervention Lesion Assessment The intervention was successful. Pre-interventional TIMI flow is 3. Post-intervention TIMI flow is 3. No complications occurred at this lesion. There is a 0% residual stenosis post intervention.  Mid RCA-1 lesion Stent (Also treats lesions: Prox RCA, and Mid RCA-2) See details in Prox RCA lesion. Post-Intervention Lesion Assessment The intervention was successful. Pre-interventional TIMI flow is 3. Post-intervention TIMI flow is 3. There is a 0% residual stenosis post intervention.  Mid RCA-2 lesion Stent (Also treats lesions: Prox RCA, and Mid RCA-1) See details in Prox RCA lesion. Post-Intervention Lesion Assessment The intervention was successful. Pre-interventional TIMI flow is 3. Post-intervention TIMI flow is 3. No complications occurred at this lesion. There is a 0% residual stenosis post intervention.   CARDIAC CATHETERIZATION  CARDIAC CATHETERIZATION 11/28/2020  Narrative  Prox LAD to Mid LAD lesion is 90% stenosed.  1st Diag lesion is 80% stenosed.  1st Mrg lesion is 90% stenosed.  Mid Cx lesion is 100% stenosed.  Prox RCA lesion is 30% stenosed.  Prox RCA to Mid RCA lesion is 50% stenosed.  LV end diastolic pressure is mildly elevated.  1. 3 vessel obstructive CAD - 90% proximal to mid LAD, 80% first diagonal - 90% large OM1, 100% LCX post OM1 with right to left collaterals. - Patent stent in the proximal RCA. 50% mid vessel 2. Mildly elevated  LVEDP  Plan: recommend CT surgery consult for CABG.  Findings Coronary Findings Diagnostic  Dominance: Right  Left Main The vessel originates from a separate ostium.  Left Anterior Descending Prox LAD to Mid LAD lesion is 90% stenosed. The lesion is located at the bifurcation and segmental.  First Diagonal Branch 1st Diag lesion is 80% stenosed.  First Septal Branch Collaterals 1st Sept filled by collaterals from RPDA.  Left Circumflex  Mid Cx lesion is 100% stenosed.  First Obtuse Marginal Branch Vessel is large in size. 1st Mrg lesion is 90% stenosed.  Second Obtuse Marginal Branch Collaterals 2nd Mrg filled by collaterals from 3rd RPL.  Right Coronary Artery Prox RCA lesion is 30% stenosed. The lesion was previously treated using a drug eluting stent over 2 years ago. Prox RCA to Mid RCA lesion is 50% stenosed.  Intervention  No interventions have been documented.   STRESS TESTS  MYOCARDIAL PERFUSION IMAGING 08/05/2017  Narrative  Nuclear stress EF: 62%.  No T wave inversion was noted during stress.  There was no ST segment deviation noted during stress.  This is a low risk study.  Normal perfusion. LVEF 62% with normal wall motion. This is a low risk study.   ECHOCARDIOGRAM  ECHOCARDIOGRAM COMPLETE 05/30/2021  Narrative ECHOCARDIOGRAM REPORT    Patient Name:   Krystal Mcpherson Date of Exam: 05/30/2021 Medical Rec #:  [id number]   Height:       66.0 in Accession #:    [id number]  Weight:       201.1 lb Date of Birth:  28-Oct-1940  BSA:          2.004 m Patient Age:    50 years    BP:           162/49 mmHg Patient Gender: F           HR:           63 bpm. Exam Location:  Inpatient  Procedure: 2D Echo, Color Doppler and Cardiac Doppler  Indications:    elevated trops  History:        Patient has prior history of Echocardiogram examinations, most recent 11/28/2020. Previous Myocardial Infarction and CAD, Prior CABG, Arrythmias:Bradycardia; Risk  Factors:Hypertension, Diabetes and Dyslipidemia.  Sonographer:    Melissa Morford RDCS (AE, PE) Referring Phys: [id number] FAN YE  IMPRESSIONS   1. Left ventricular ejection fraction, by estimation, is 55 to 60%. The left ventricle has normal function. The left ventricle has no regional wall motion abnormalities. Left ventricular diastolic parameters were normal. 2. Right ventricular systolic function is normal. The right ventricular size is normal. 3. Left atrial size was mildly dilated. 4. Right atrial size was mildly dilated. 5. The mitral valve is abnormal. Mild mitral valve regurgitation. No evidence of mitral stenosis. 6. Tricuspid valve regurgitation is moderate. 7. The aortic valve is normal in structure. Aortic valve regurgitation is not visualized. No aortic stenosis is present. 8. The inferior vena cava is normal in size with greater than 50% respiratory variability, suggesting right atrial pressure of 3 mmHg.  FINDINGS Left Ventricle: Left ventricular ejection fraction, by estimation, is 55 to 60%. The left ventricle has normal function. The left ventricle has no regional wall motion abnormalities. The left ventricular internal cavity size was normal in size. There is no left ventricular hypertrophy. Left ventricular diastolic parameters were normal.  Right Ventricle: The right ventricular size is normal. No increase in right ventricular wall thickness. Right ventricular systolic function is normal.  Left Atrium: Left atrial size was mildly dilated.  Right Atrium: Right atrial size was mildly dilated.  Pericardium: There is no evidence of pericardial effusion.  Mitral Valve: The mitral valve is abnormal. There is mild thickening of the mitral valve leaflet(s). Mild mitral valve regurgitation. No evidence of mitral valve stenosis.  Tricuspid Valve: The tricuspid valve is normal in structure. Tricuspid valve regurgitation is moderate . No evidence of tricuspid  stenosis.  Aortic Valve: The aortic valve is normal in structure. Aortic valve regurgitation is not visualized. No aortic stenosis is present.  Pulmonic Valve: The pulmonic valve was normal in structure. Pulmonic valve regurgitation is not visualized. No evidence of pulmonic stenosis.  Aorta: The aortic root is normal in size and structure.  Venous: The inferior vena cava is normal in size with greater than 50% respiratory variability, suggesting right atrial pressure of 3 mmHg.  IAS/Shunts: No atrial level shunt detected by color flow Doppler.   LEFT VENTRICLE PLAX 2D LVIDd:         4.70 cm   Diastology LVIDs:         3.10 cm   LV e' medial:    4.68 cm/s LV PW:         0.80 cm   LV E/e' medial:  21.4 LV IVS:        0.90 cm   LV e' lateral:   7.40 cm/s LVOT diam:     2.00 cm   LV E/e' lateral: 13.5 LV SV:         78 LV SV Index:   39 LVOT Area:     3.14 cm  3D Volume EF: 3D EF:        54 % LV EDV:       120 ml LV ESV:       55 ml LV SV:        66 ml  RIGHT VENTRICLE RV S prime:     8.49 cm/s TAPSE (M-mode): 0.8 cm  LEFT ATRIUM             Index        RIGHT ATRIUM           Index LA diam:        4.00 cm 2.00 cm/m   RA Area:     15.80 cm LA Vol (A2C):   27.8 ml 13.87 ml/m  RA Volume:   33.10 ml  16.51 ml/m LA Vol (A4C):   47.3 ml 23.60 ml/m LA Biplane Vol: 45.2 ml 22.55 ml/m AORTIC VALVE LVOT Vmax:   96.90 cm/s LVOT Vmean:  67.400 cm/s LVOT VTI:    0.249 m  AORTA Ao Root diam: 2.90 cm  MITRAL VALVE                TRICUSPID VALVE MV Area (PHT): 3.97 cm     TR Peak grad:   30.5 mmHg MV Decel Time: 191 msec     TR Vmax:        276.00 cm/s MV E velocity: 100.00 cm/s MV A velocity: 104.00 cm/s  SHUNTS MV E/A ratio:  0.96         Systemic VTI:  0.25 m Systemic Diam: 2.00 cm  Charlton Haws MD Electronically signed by Charlton Haws MD Signature Date/Time: 05/30/2021/10:06:42 AM    Final   TEE  ECHO INTRAOPERATIVE TEE 12/04/2020  Narrative *INTRAOPERATIVE  TRANSESOPHAGEAL REPORT *    Patient Name:   Krystal Mcpherson Date of Exam: 12/04/2020 Medical Rec #:  [id number]   Height:       66.0 in Accession #:    [id number]  Weight:       200.4 lb Date of Birth:  January 30, 1941  BSA:          2.00 m Patient Age:    79 years    BP:           124/69 mmHg Patient Gender:  F           HR:           53 bpm. Exam Location:  Inpatient  Transesophogeal exam was perform intraoperatively during surgical procedure. Patient was closely monitored under general anesthesia during the entirety of examination.  Indications:     CABG Performing Phys: Leslye Peer MD Diagnosing Phys: Leslye Peer MD  Complications: No known complications during this procedure. POST-OP IMPRESSIONS - Left Ventricle: Lateral wall motion improved post-bypass. - Right Ventricle: The right ventricle appears unchanged from pre-bypass. - Aorta: The aorta appears unchanged from pre-bypass. - Left Atrium: The left atrium appears unchanged from pre-bypass. - Left Atrial Appendage: The left atrial appendage appears unchanged from pre-bypass. - Aortic Valve: The aortic valve appears unchanged from pre-bypass. - Mitral Valve: The mitral valve appears unchanged from pre-bypass. - Tricuspid Valve: The tricuspid valve appears unchanged from pre-bypass. - Pulmonic Valve: The pulmonic valve appears unchanged from pre-bypass. - Interatrial Septum: The interatrial septum appears unchanged from pre-bypass. - Interventricular Septum: The interventricular septum appears unchanged from pre-bypass. - Pericardium: The pericardium appears unchanged from pre-bypass.  PRE-OP FINDINGS Left Ventricle: The left ventricle has low normal systolic function, with an ejection fraction of 50-55%. The cavity size was normal. There is no increase in left ventricular wall thickness. There is no left ventricular hypertrophy.   LV Wall Scoring: Lateral LV wall is hypokinetic at mid and mid-apical segments.   Right  Ventricle: The right ventricle has normal systolic function. The cavity was normal. There is no increase in right ventricular wall thickness.  Left Atrium: Left atrial size was normal in size. No left atrial/left atrial appendage thrombus was detected.  Right Atrium: Right atrial size was normal in size.  Interatrial Septum: The interatrial septum was not assessed.  Pericardium: There is no evidence of pericardial effusion.  Mitral Valve: The mitral valve is normal in structure. Mitral valve regurgitation is mild by color flow Doppler.  Tricuspid Valve: The tricuspid valve was normal in structure. Tricuspid valve regurgitation is trivial by color flow Doppler.  Aortic Valve: The aortic valve is tricuspid Aortic valve regurgitation was not visualized by color flow Doppler. There is no stenosis of the aortic valve.   Pulmonic Valve: The pulmonic valve was normal in structure. Pulmonic valve regurgitation is trivial by color flow Doppler.   Aorta: The aortic root, ascending aorta and aortic arch are normal in size and structure.   Leslye Peer MD Electronically signed by Leslye Peer MD Signature Date/Time: 12/04/2020/1:08:23 PM    Final   MONITORS  LONG TERM MONITOR (3-14 DAYS) 11/21/2018  Narrative  Brief, less than 5 beat runs of supraventricular tachycardia, fastest at 160 bpm, appears to be paroxysmal atrial tachycardia.  No evidence of atrial fibrillation or flutter.  No significant pauses noted.  Occasionally symptoms of heart fluttering are associated with the short SVT lasting 4-5 beats.  Occasional heart fluttering or skipping could be associated with the short-lived SVT episodes.  These could be conservatively with no further medication as they should be benign.  However, if desired, we can increase the atenolol from 12.5 mg once a day to 25 mg a day.  This may help calm down some of these skipped beats. Donato Schultz, MD            EKG:  EKG is  ordered today.   The ekg ordered today demonstrates sinus bradycardia, rate 55 bpm, no ST elevation  Recent Labs: 01/02/2022: ALT 13; BUN 14;  Creatinine, Ser 1.00; Hemoglobin 13.2; Magnesium 2.1; Platelets 214; Potassium 4.1; Sodium 136 01/09/2022: TSH 1.330  Recent Lipid Panel    Component Value Date/Time   CHOL 92 (L) 09/22/2021 0956   TRIG 82 09/22/2021 0956   HDL 50 09/22/2021 0956   CHOLHDL 1.8 09/22/2021 0956   CHOLHDL 3.1 11/29/2020 0609   VLDL 11 11/29/2020 0609   LDLCALC 25 09/22/2021 0956    Risk Assessment/Calculations:   CHA2DS2-VASc Score =     This indicates a  % annual risk of stroke. The patient's score is based upon:      Home Medications   Current Meds  Medication Sig   acetaminophen (TYLENOL) 500 MG tablet Take 500-1,000 mg by mouth every 6 (six) hours as needed for mild pain (pain).   albuterol (PROVENTIL HFA;VENTOLIN HFA) 108 (90 BASE) MCG/ACT inhaler Inhale 2 puffs into the lungs every 4 (four) hours as needed for wheezing or shortness of breath. Reported on 10/30/2015   apixaban (ELIQUIS) 5 MG TABS tablet Take 5 mg by mouth 2 (two) times daily.   carvedilol (COREG) 3.125 MG tablet Take 1 tablet (3.125 mg total) by mouth 2 (two) times daily.   cetirizine (ZYRTEC) 10 MG tablet Take 10 mg by mouth in the morning.   fluticasone (FLONASE) 50 MCG/ACT nasal spray Place 1 spray into both nostrils daily.   hydrALAZINE (APRESOLINE) 10 MG tablet TAKE 1/2 TABLET BY MOUTH EVERY MORNING &AT BEDTIME   nitroGLYCERIN (NITROSTAT) 0.4 MG SL tablet Place 1 tablet (0.4 mg total) under the tongue every 5 (five) minutes x 3 doses as needed for chest pain.   olmesartan (BENICAR) 40 MG tablet TAKE ONE (1) TABLET BY MOUTH EVERY DAY   REPATHA SURECLICK 140 MG/ML SOAJ INJECT 140MG  INTO THE SKIN EVERY 14 DAYS   [DISCONTINUED] hydrochlorothiazide (HYDRODIURIL) 25 MG tablet TAKE ONE (1) TABLET BY MOUTH EVERY DAY     Review of Systems      All other systems reviewed and are otherwise negative except as  noted above.  Physical Exam    VS:  BP 124/60   Pulse (!) 55   Ht 5\' 6"  (1.676 m)   Wt 203 lb (92.1 kg)   SpO2 98%   BMI 32.77 kg/m  , BMI Body mass index is 32.77 kg/m.  Wt Readings from Last 3 Encounters:  11/20/22 203 lb (92.1 kg)  06/25/22 201 lb (91.2 kg)  03/16/22 203 lb (92.1 kg)     GEN: Well nourished, well developed, in no acute distress. HEENT: normal. Neck: Supple, no JVD, carotid bruits, or masses. Cardiac: RRR, no murmurs, rubs, or gallops. No clubbing, cyanosis, edema.  Radials/PT 2+ and equal bilaterally.  Respiratory:  Respirations regular and unlabored, clear to auscultation bilaterally. GI: Soft, nontender, nondistended. MS: No deformity or atrophy. Skin: Warm and dry, no rash. Neuro:  Strength and sensation are intact. Psych: Normal affect.  Assessment & Plan    CAD status post CABG with subsequent DES to RCA -some arm pain and was evaluated by PCP -no chest pain or indigestion (her anginal equivalent)  -tolerating current medications, continue Eliquis 5 mg twice a day, carvedilol 3.125 mg twice a day, Repatha 140 mg every 2 weeks, hydralazine 5 mg twice a day, HCTZ increased to 37.5 mg daily, nitro as needed, Benicar 40 mg daily, Crestor 5 mg daily -no ischemic workup needed at this time  Hyperlipidemia -Continue Crestor 5 mg daily and Repatha 140 mg every 14 days -Most recent LDL 30, at  goal  DVT on anticoagulation -Continue Eliquis 5 mg twice daily -No further incidents  Postoperative atrial fibrillation -She has had issues with this but infrequently -She tells me she had a left atrial appendage clip at the time of her surgery in 2022 -Continue carvedilol and Eliquis  Hypertension -Well-controlled today, continue current medications -We have increased her HCTZ today due to some lower extremity swelling -Please keep track of your blood pressure at home an hour after morning medications an hour after evening medications -Plan for a follow-up  BMP and BNP in a week.    Disposition: Follow up 3-4 months with Donato Schultz, MD or APP.  Signed, Sharlene Dory, PA-C 11/20/2022, 12:25 PM [facility]

## 2022-11-20 ENCOUNTER — Encounter: Payer: Self-pay | Admitting: Physician Assistant

## 2022-11-20 ENCOUNTER — Ambulatory Visit: Payer: Medicare PPO | Attending: Physician Assistant | Admitting: Physician Assistant

## 2022-11-20 VITALS — BP 124/60 | HR 55 | Ht 66.0 in | Wt 203.0 lb

## 2022-11-20 DIAGNOSIS — I25118 Atherosclerotic heart disease of native coronary artery with other forms of angina pectoris: Secondary | ICD-10-CM | POA: Diagnosis not present

## 2022-11-20 DIAGNOSIS — I2510 Atherosclerotic heart disease of native coronary artery without angina pectoris: Secondary | ICD-10-CM | POA: Diagnosis not present

## 2022-11-20 DIAGNOSIS — I10 Essential (primary) hypertension: Secondary | ICD-10-CM | POA: Diagnosis not present

## 2022-11-20 DIAGNOSIS — Z951 Presence of aortocoronary bypass graft: Secondary | ICD-10-CM | POA: Diagnosis not present

## 2022-11-20 DIAGNOSIS — Z86718 Personal history of other venous thrombosis and embolism: Secondary | ICD-10-CM | POA: Diagnosis not present

## 2022-11-20 DIAGNOSIS — E785 Hyperlipidemia, unspecified: Secondary | ICD-10-CM | POA: Diagnosis not present

## 2022-11-20 MED ORDER — HYDROCHLOROTHIAZIDE 25 MG PO TABS
37.5000 mg | ORAL_TABLET | Freq: Every day | ORAL | 1 refills | Status: DC
Start: 2022-11-20 — End: 2023-02-16

## 2022-11-20 NOTE — Patient Instructions (Addendum)
Medication Instructions:  INCREASE Hydrochlorothiazide to 37.5mg  Take 1.5 tablet once a day  *If you need a refill on your cardiac medications before your next appointment, please call your pharmacy*   Lab Work: 1 WEEK-BMET, MAG & BNP If you have labs (blood work) drawn today and your tests are completely normal, you will receive your results only by: MyChart Message (if you have MyChart) OR A paper copy in the mail If you have any lab test that is abnormal or we need to change your treatment, we will call you to review the results.   Testing/Procedures: NONE ORDERED   Follow-Up: At [facility], you and your health needs are our priority.  As part of our continuing mission to provide you with exceptional heart care, we have created designated Provider Care Teams.  These Care Teams include your primary Cardiologist (physician) and Advanced Practice Providers (APPs -  Physician Assistants and Nurse Practitioners) who all work together to provide you with the care you need, when you need it.  We recommend signing up for the patient portal called "MyChart".  Sign up information is provided on this After Visit Summary.  MyChart is used to connect with patients for Virtual Visits (Telemedicine).  Patients are able to view lab/test results, encounter notes, upcoming appointments, etc.  Non-urgent messages can be sent to your provider as well.   To learn more about what you can do with MyChart, go to [email or web address].    Your next appointment:   3-4 month(s)  Provider:   Donato Schultz, MD            Other Instructions Check your blood pressure daily for 2 weeks, then contact the office with your readings.  Please check your weight daily. Please contact the office if you gain more than 3lbs in a day or 5lbs in a week. Limit your salt intake to 1500-2000mg  per day or 500mg  of Sodium per meal.

## 2022-11-27 ENCOUNTER — Ambulatory Visit: Payer: Medicare PPO | Attending: Physician Assistant

## 2022-11-27 DIAGNOSIS — I2510 Atherosclerotic heart disease of native coronary artery without angina pectoris: Secondary | ICD-10-CM

## 2022-11-27 DIAGNOSIS — Z951 Presence of aortocoronary bypass graft: Secondary | ICD-10-CM

## 2022-11-27 DIAGNOSIS — I10 Essential (primary) hypertension: Secondary | ICD-10-CM | POA: Diagnosis not present

## 2022-11-27 DIAGNOSIS — Z86718 Personal history of other venous thrombosis and embolism: Secondary | ICD-10-CM

## 2022-11-27 DIAGNOSIS — E785 Hyperlipidemia, unspecified: Secondary | ICD-10-CM

## 2022-11-28 LAB — BASIC METABOLIC PANEL WITH GFR
BUN/Creatinine Ratio: 23 (ref 12–28)
BUN: 29 mg/dL — ABNORMAL HIGH (ref 8–27)
CO2: 25 mmol/L (ref 20–29)
Calcium: 9.4 mg/dL (ref 8.7–10.3)
Chloride: 100 mmol/L (ref 96–106)
Creatinine, Ser: 1.28 mg/dL — ABNORMAL HIGH (ref 0.57–1.00)
Glucose: 77 mg/dL (ref 70–99)
Potassium: 5.1 mmol/L (ref 3.5–5.2)
Sodium: 138 mmol/L (ref 134–144)
eGFR: 43 mL/min/1.73 — ABNORMAL LOW

## 2022-11-28 LAB — MAGNESIUM: Magnesium: 2.2 mg/dL (ref 1.6–2.3)

## 2022-11-28 LAB — PRO B NATRIURETIC PEPTIDE: NT-Pro BNP: 824 pg/mL — ABNORMAL HIGH (ref 0–738)

## 2022-12-02 ENCOUNTER — Other Ambulatory Visit: Payer: Self-pay

## 2022-12-02 ENCOUNTER — Other Ambulatory Visit: Payer: Self-pay | Admitting: *Deleted

## 2022-12-02 DIAGNOSIS — N183 Chronic kidney disease, stage 3 unspecified: Secondary | ICD-10-CM

## 2022-12-02 DIAGNOSIS — R600 Localized edema: Secondary | ICD-10-CM

## 2022-12-02 DIAGNOSIS — R5383 Other fatigue: Secondary | ICD-10-CM | POA: Diagnosis not present

## 2022-12-02 DIAGNOSIS — R7989 Other specified abnormal findings of blood chemistry: Secondary | ICD-10-CM | POA: Diagnosis not present

## 2022-12-02 DIAGNOSIS — W57XXXA Bitten or stung by nonvenomous insect and other nonvenomous arthropods, initial encounter: Secondary | ICD-10-CM | POA: Diagnosis not present

## 2022-12-02 DIAGNOSIS — Z79899 Other long term (current) drug therapy: Secondary | ICD-10-CM

## 2022-12-07 ENCOUNTER — Other Ambulatory Visit: Payer: Self-pay | Admitting: Cardiology

## 2022-12-21 ENCOUNTER — Ambulatory Visit (HOSPITAL_COMMUNITY)
Admission: RE | Admit: 2022-12-21 | Discharge: 2022-12-21 | Disposition: A | Discharge status: Home / Self Care | Payer: Medicare PPO | Source: Ambulatory Visit | Attending: Physician Assistant | Admitting: Physician Assistant

## 2022-12-21 ENCOUNTER — Ambulatory Visit: Payer: Medicare PPO | Attending: Physician Assistant

## 2022-12-21 DIAGNOSIS — Z79899 Other long term (current) drug therapy: Secondary | ICD-10-CM | POA: Diagnosis not present

## 2022-12-21 DIAGNOSIS — I252 Old myocardial infarction: Secondary | ICD-10-CM | POA: Diagnosis not present

## 2022-12-21 DIAGNOSIS — R600 Localized edema: Secondary | ICD-10-CM

## 2022-12-21 DIAGNOSIS — R0600 Dyspnea, unspecified: Secondary | ICD-10-CM | POA: Diagnosis not present

## 2022-12-21 DIAGNOSIS — E785 Hyperlipidemia, unspecified: Secondary | ICD-10-CM | POA: Insufficient documentation

## 2022-12-21 DIAGNOSIS — I10 Essential (primary) hypertension: Secondary | ICD-10-CM | POA: Insufficient documentation

## 2022-12-21 DIAGNOSIS — N183 Chronic kidney disease, stage 3 unspecified: Secondary | ICD-10-CM | POA: Diagnosis not present

## 2022-12-21 DIAGNOSIS — I4891 Unspecified atrial fibrillation: Secondary | ICD-10-CM | POA: Diagnosis not present

## 2022-12-21 DIAGNOSIS — I2510 Atherosclerotic heart disease of native coronary artery without angina pectoris: Secondary | ICD-10-CM | POA: Insufficient documentation

## 2022-12-21 NOTE — Progress Notes (Signed)
Echocardiogram 2D Echocardiogram has been performed.  Augustine Radar 12/21/2022, 2:50 PM

## 2022-12-22 ENCOUNTER — Other Ambulatory Visit: Payer: Self-pay | Admitting: Cardiology

## 2022-12-22 LAB — ECHOCARDIOGRAM COMPLETE
Area-P 1/2: 3.6 cm2
Calc EF: 66.5 %
S' Lateral: 2.5 cm
Single Plane A2C EF: 69.8 %
Single Plane A4C EF: 64.3 %

## 2022-12-22 LAB — BASIC METABOLIC PANEL
BUN/Creatinine Ratio: 24 (ref 12–28)
BUN: 30 mg/dL — ABNORMAL HIGH (ref 8–27)
CO2: 25 mmol/L (ref 20–29)
Calcium: 9.9 mg/dL (ref 8.7–10.3)
Chloride: 101 mmol/L (ref 96–106)
Creatinine, Ser: 1.27 mg/dL — ABNORMAL HIGH (ref 0.57–1.00)
Glucose: 94 mg/dL (ref 70–99)
Potassium: 4.6 mmol/L (ref 3.5–5.2)
Sodium: 139 mmol/L (ref 134–144)
eGFR: 43 mL/min/{1.73_m2} — ABNORMAL LOW (ref >59)

## 2023-02-15 ENCOUNTER — Other Ambulatory Visit: Payer: Self-pay | Admitting: Physician Assistant

## 2023-02-15 DIAGNOSIS — I25118 Atherosclerotic heart disease of native coronary artery with other forms of angina pectoris: Secondary | ICD-10-CM

## 2023-03-02 ENCOUNTER — Encounter: Payer: Self-pay | Admitting: Cardiology

## 2023-03-02 ENCOUNTER — Ambulatory Visit: Payer: Medicare PPO | Admitting: Cardiology

## 2023-03-02 VITALS — BP 122/60 | HR 66 | Ht 66.0 in | Wt 193.4 lb

## 2023-03-02 DIAGNOSIS — R600 Localized edema: Secondary | ICD-10-CM

## 2023-03-02 DIAGNOSIS — Z951 Presence of aortocoronary bypass graft: Secondary | ICD-10-CM

## 2023-03-02 DIAGNOSIS — I2510 Atherosclerotic heart disease of native coronary artery without angina pectoris: Secondary | ICD-10-CM

## 2023-03-02 NOTE — Progress Notes (Signed)
  Cardiology Office Note:  .   Date:  03/02/2023  ID:  Krystal Mcpherson, DOB 26-Feb-1941, MRN [id number] PCP: Soundra Pilon, FNP  [facility] Providers Cardiologist:  Donato Schultz, MD    History of Present Illness: .   Krystal Mcpherson is a 82 y.o. female Discussed the use of AI scribe software for clinical note transcription with the patient, who gave verbal consent to proceed.  History of Present Illness   The patient, with a history of coronary artery bypass grafting 2022, stent placement in the right coronary artery 2022, high cholesterol, and a previous deep vein thrombosis (DVT), presents with arthritis in both knees. The left knee is more symptomatic, causing discomfort, stiffness, especially in the mornings, and occasional swelling. The patient manages the knee pain with heat and infrared therapy. The patient also reports occasional lightheadedness and an irregular heartbeat detected by their home blood pressure monitor. The patient has a family history of heart disease, with both parents having died of massive heart attacks. The patient is active and tries to maintain a healthy diet.       ROS: Minor bruising, no CP, no SOB  Studies Reviewed: Marland Kitchen        LABS Total cholesterol: 101 (11/17/2022) LDL: 30 (11/17/2022)  DIAGNOSTIC Echocardiogram: Normal pump function, ejection fraction 60-65%, normal right ventricle, trivial mitral regurgitation (12/21/2022)  Cardiac catheterization: Patent left radial artery graft supplying ramus and circumflex marginal branch, patent graft to mid LAD, stent to right coronary artery (05/2021) Risk Assessment/Calculations:        Physical Exam:   VS:  BP 122/60   Pulse 66   Ht 5\' 6"  (1.676 m)   Wt 193 lb 6.4 oz (87.7 kg)   SpO2 97%   BMI 31.22 kg/m    Wt Readings from Last 3 Encounters:  03/02/23 193 lb 6.4 oz (87.7 kg)  11/20/22 203 lb (92.1 kg)  06/25/22 201 lb (91.2 kg)    GEN: Well nourished, well developed in no acute distress NECK: No  JVD; No carotid bruits CARDIAC: RRR, no murmurs, rubs, gallops RESPIRATORY:  Clear to auscultation without rales, wheezing or rhonchi  ABDOMEN: Soft, non-tender, non-distended EXTREMITIES:  minimal left LE edema; No deformity, spider veins  ASSESSMENT AND PLAN: .   Assessment and Plan    Arthritis Predominantly in the left knee causing stiffness, pain, and swelling. Patient declined three injections recommended by orthopedic specialist and is using a heat and infrared device for pain relief. -Continue current self-management strategy.  Deep Vein Thrombosis (DVT) No recent incidents. Patient reported occasional groin pain and concern about potential clot formation. -Continue Eliquis for anticoagulation, reassurance.  Hyperlipidemia Well controlled with Repatha and Crestor 5mg  daily. Recent LDL was 30. No side effects -Continue current medication regimen.  Coronary Artery Disease Status post bypass surgery and stent placement in the right coronary artery 2022. Recent echocardiogram showed normal heart function. Cath with patent grafts. -Continue carvedilol 3.125mg  twice daily and olmesartan 40mg  daily. -Atriclip  General Health Maintenance -Return for follow-up in six months with Tessa. -Annual follow-up with current provider.              Signed, Donato Schultz, MD

## 2023-03-02 NOTE — Patient Instructions (Signed)
Medication Instructions:  The current medical regimen is effective;  continue present plan and medications.  *If you need a refill on your cardiac medications before your next appointment, please call your pharmacy*  Follow-Up: At [facility], you and your health needs are our priority.  As part of our continuing mission to provide you with exceptional heart care, we have created designated Provider Care Teams.  These Care Teams include your primary Cardiologist (physician) and Advanced Practice Providers (APPs -  Physician Assistants and Nurse Practitioners) who all work together to provide you with the care you need, when you need it.  We recommend signing up for the patient portal called "MyChart".  Sign up information is provided on this After Visit Summary.  MyChart is used to connect with patients for Virtual Visits (Telemedicine).  Patients are able to view lab/test results, encounter notes, upcoming appointments, etc.  Non-urgent messages can be sent to your provider as well.   To learn more about what you can do with MyChart, go to [email or web address].    Your next appointment:   6 month(s)  Provider:   Tessa Conte, PA-C      Then, Mark Skains, MD will plan to see you again in 1 year(s).     

## 2023-03-29 DIAGNOSIS — D485 Neoplasm of uncertain behavior of skin: Secondary | ICD-10-CM | POA: Diagnosis not present

## 2023-03-29 DIAGNOSIS — Z1283 Encounter for screening for malignant neoplasm of skin: Secondary | ICD-10-CM | POA: Diagnosis not present

## 2023-03-29 DIAGNOSIS — L820 Inflamed seborrheic keratosis: Secondary | ICD-10-CM | POA: Diagnosis not present

## 2023-03-29 DIAGNOSIS — D2272 Melanocytic nevi of left lower limb, including hip: Secondary | ICD-10-CM | POA: Diagnosis not present

## 2023-03-29 DIAGNOSIS — L570 Actinic keratosis: Secondary | ICD-10-CM | POA: Diagnosis not present

## 2023-03-29 DIAGNOSIS — X32XXXA Exposure to sunlight, initial encounter: Secondary | ICD-10-CM | POA: Diagnosis not present

## 2023-03-29 DIAGNOSIS — D225 Melanocytic nevi of trunk: Secondary | ICD-10-CM | POA: Diagnosis not present

## 2023-04-12 DIAGNOSIS — L718 Other rosacea: Secondary | ICD-10-CM | POA: Diagnosis not present

## 2023-04-12 DIAGNOSIS — L988 Other specified disorders of the skin and subcutaneous tissue: Secondary | ICD-10-CM | POA: Diagnosis not present

## 2023-04-12 DIAGNOSIS — L570 Actinic keratosis: Secondary | ICD-10-CM | POA: Diagnosis not present

## 2023-04-12 DIAGNOSIS — D485 Neoplasm of uncertain behavior of skin: Secondary | ICD-10-CM | POA: Diagnosis not present

## 2023-04-12 DIAGNOSIS — X32XXXD Exposure to sunlight, subsequent encounter: Secondary | ICD-10-CM | POA: Diagnosis not present

## 2023-04-28 DIAGNOSIS — L718 Other rosacea: Secondary | ICD-10-CM | POA: Diagnosis not present

## 2023-04-28 DIAGNOSIS — D485 Neoplasm of uncertain behavior of skin: Secondary | ICD-10-CM | POA: Diagnosis not present

## 2023-05-25 DIAGNOSIS — D6859 Other primary thrombophilia: Secondary | ICD-10-CM | POA: Diagnosis not present

## 2023-05-25 DIAGNOSIS — N1831 Chronic kidney disease, stage 3a: Secondary | ICD-10-CM | POA: Diagnosis not present

## 2023-05-25 DIAGNOSIS — E785 Hyperlipidemia, unspecified: Secondary | ICD-10-CM | POA: Diagnosis not present

## 2023-05-25 DIAGNOSIS — I10 Essential (primary) hypertension: Secondary | ICD-10-CM | POA: Diagnosis not present

## 2023-05-25 DIAGNOSIS — I25118 Atherosclerotic heart disease of native coronary artery with other forms of angina pectoris: Secondary | ICD-10-CM | POA: Diagnosis not present

## 2023-05-25 DIAGNOSIS — N281 Cyst of kidney, acquired: Secondary | ICD-10-CM | POA: Diagnosis not present

## 2023-05-25 DIAGNOSIS — R0789 Other chest pain: Secondary | ICD-10-CM | POA: Diagnosis not present

## 2023-05-25 DIAGNOSIS — K760 Fatty (change of) liver, not elsewhere classified: Secondary | ICD-10-CM | POA: Diagnosis not present

## 2023-06-09 DIAGNOSIS — Z9049 Acquired absence of other specified parts of digestive tract: Secondary | ICD-10-CM | POA: Diagnosis not present

## 2023-06-09 DIAGNOSIS — N281 Cyst of kidney, acquired: Secondary | ICD-10-CM | POA: Diagnosis not present

## 2023-06-09 DIAGNOSIS — K760 Fatty (change of) liver, not elsewhere classified: Secondary | ICD-10-CM | POA: Diagnosis not present

## 2023-06-28 ENCOUNTER — Telehealth: Payer: Self-pay | Admitting: Cardiology

## 2023-06-28 NOTE — Telephone Encounter (Signed)
Patient wants a provider switch from Dr. Anne Fu at [facility] to Dr. Cristal Deer at [facility] as it is closer to her home.

## 2023-06-28 NOTE — Telephone Encounter (Signed)
OK by me

## 2023-08-31 DIAGNOSIS — M170 Bilateral primary osteoarthritis of knee: Secondary | ICD-10-CM | POA: Diagnosis not present

## 2023-09-13 DIAGNOSIS — Z1283 Encounter for screening for malignant neoplasm of skin: Secondary | ICD-10-CM | POA: Diagnosis not present

## 2023-09-13 DIAGNOSIS — D225 Melanocytic nevi of trunk: Secondary | ICD-10-CM | POA: Diagnosis not present

## 2023-09-22 DIAGNOSIS — E785 Hyperlipidemia, unspecified: Secondary | ICD-10-CM | POA: Diagnosis not present

## 2023-09-22 DIAGNOSIS — K760 Fatty (change of) liver, not elsewhere classified: Secondary | ICD-10-CM | POA: Diagnosis not present

## 2023-09-22 DIAGNOSIS — I5030 Unspecified diastolic (congestive) heart failure: Secondary | ICD-10-CM | POA: Diagnosis not present

## 2023-09-22 DIAGNOSIS — J4520 Mild intermittent asthma, uncomplicated: Secondary | ICD-10-CM | POA: Diagnosis not present

## 2023-09-22 DIAGNOSIS — D6869 Other thrombophilia: Secondary | ICD-10-CM | POA: Diagnosis not present

## 2023-09-22 DIAGNOSIS — I10 Essential (primary) hypertension: Secondary | ICD-10-CM | POA: Diagnosis not present

## 2023-09-22 DIAGNOSIS — I2510 Atherosclerotic heart disease of native coronary artery without angina pectoris: Secondary | ICD-10-CM | POA: Diagnosis not present

## 2023-09-22 DIAGNOSIS — R42 Dizziness and giddiness: Secondary | ICD-10-CM | POA: Diagnosis not present

## 2023-09-22 DIAGNOSIS — I25118 Atherosclerotic heart disease of native coronary artery with other forms of angina pectoris: Secondary | ICD-10-CM | POA: Diagnosis not present

## 2023-10-11 ENCOUNTER — Ambulatory Visit (HOSPITAL_BASED_OUTPATIENT_CLINIC_OR_DEPARTMENT_OTHER): Payer: Medicare PPO | Admitting: Cardiology

## 2023-10-11 ENCOUNTER — Encounter (HOSPITAL_BASED_OUTPATIENT_CLINIC_OR_DEPARTMENT_OTHER): Payer: Self-pay | Admitting: Cardiology

## 2023-10-11 VITALS — BP 104/58 | HR 59 | Ht 66.0 in | Wt 191.5 lb

## 2023-10-11 DIAGNOSIS — Z7901 Long term (current) use of anticoagulants: Secondary | ICD-10-CM | POA: Diagnosis not present

## 2023-10-11 DIAGNOSIS — I959 Hypotension, unspecified: Secondary | ICD-10-CM | POA: Diagnosis not present

## 2023-10-11 DIAGNOSIS — Z86718 Personal history of other venous thrombosis and embolism: Secondary | ICD-10-CM | POA: Diagnosis not present

## 2023-10-11 DIAGNOSIS — Z951 Presence of aortocoronary bypass graft: Secondary | ICD-10-CM | POA: Diagnosis not present

## 2023-10-11 DIAGNOSIS — I10 Essential (primary) hypertension: Secondary | ICD-10-CM | POA: Diagnosis not present

## 2023-10-11 DIAGNOSIS — I2510 Atherosclerotic heart disease of native coronary artery without angina pectoris: Secondary | ICD-10-CM

## 2023-10-11 DIAGNOSIS — E785 Hyperlipidemia, unspecified: Secondary | ICD-10-CM | POA: Diagnosis not present

## 2023-10-11 NOTE — Patient Instructions (Addendum)
 Medication Instructions:  Your physician has recommended you make the following change in your medication:   STOP Hydrochlorothiazide STOP Olmesartan  *If you need a refill on your cardiac medications before your next appointment, please call your pharmacy*  Follow-Up: At [facility], you and your health needs are our priority.  As part of our continuing mission to provide you with exceptional heart care, our providers are all part of one team.  This team includes your primary Cardiologist (physician) and Advanced Practice Providers or APPs (Physician Assistants and Nurse Practitioners) who all work together to provide you with the care you need, when you need it.  Your next appointment:   6 weeks with Eligha Bridegroom or Gillian Shields, NP 4 months with Dr. Cristal Deer  We recommend signing up for the patient portal called "MyChart".  Sign up information is provided on this After Visit Summary.  MyChart is used to connect with patients for Virtual Visits (Telemedicine).  Patients are able to view lab/test results, encounter notes, upcoming appointments, etc.  Non-urgent messages can be sent to your provider as well.   To learn more about what you can do with MyChart, go to [email or web address].   Other Instructions Monitor home blood pressure and lightheaded symptoms. Trying compression stockings if the leg swelling comes back. Please send [place] some blood pressure numbers in a few weeks and let us know how the lightheadedness and leg swelling are doing.

## 2023-10-11 NOTE — Progress Notes (Signed)
 Cardiology Office Note:  .   Date:  10/11/2023  ID:  Krystal Mcpherson, DOB 1940/07/23, MRN [id number] PCP: Soundra Pilon, FNP  [facility] Providers Cardiologist:  Jodelle Red, MD {  History of Present Illness: .   Krystal Mcpherson is a 83 y.o. female with PMH CAD s/p CABG x3 2022, PCI to RCA 2022, prior DVT 2022 on anticoagulation, hyperlipidemia. She was previously seen by Dr. Anne Fu and established care with me on 10/11/23.  Pertinent CV history: NSTEMI 11/2020, cardiac cath showing patent RCA stent with 30% stenosis, mid RCA 50% stenosis, mid LAD 90% stenosis in the ostium of the diagonal and 80% stenosis, 90% stenosis in the first obtuse marginal coronary artery and mid circumflex totally occluded with right to left collaterals. Echo with LVEF 55 to 60%, no significant valvular abnormalities. Carotid duplex showed no evidence of carotid artery disease bilaterally. She did require Plavix without washout and underwent CABG times 3 on 12/04/20 with Dr. Renaldo Fiddler. Had atrial fibrillation postop. 11/22 underwent stenting of the RCA due to angina   Today: Overall feels that her heart is doing well. Has come balance issues but no falls. Has been having lightheadedness over the last month or so. Saw her PCP, had a workup, felt might be inner ear issue. However, still having intermittent issues. Brings a log of HR/BP from home. Had a bad day on 4/4; reviewed her BP log, range 88/48-147/81. HR 59-73. She stopped her olmesartan and then rechecked BP two days later, range 101/57-122/61.   Trying to increase her protein levels, doesn't eat a lot of meat. Has lost about 17 lbs. Hasn't had much LE edema recently, dropping from 1.5 tabs of hydrochlorothiazide to 1 tab.   Had recent labs done with Dr. Drema Pry, reviewed on her phone with her today. Of note, BUN 42, Cr 1.4, GFR 38, K 4.9. Tprot 5.8, albumin 4.0.   Cooks most of her own food, avoids salt as much as she can. Has chronic venous issues.  ROS:  Denies chest pain, shortness of breath at rest or with normal exertion. No PND, orthopnea, LE edema or unexpected weight gain. No syncope or palpitations. ROS otherwise negative except as noted.   Studies Reviewed: Marland Kitchen    EKG:  EKG Interpretation Date/Time:  Monday October 11 2023 11:43:38 EDT Ventricular Rate:  56 PR Interval:  162 QRS Duration:  74 QT Interval:  416 QTC Calculation: 401 R Axis:   47  Text Interpretation: Sinus bradycardia Possible Left atrial enlargement Confirmed by Jodelle Red [phone]) on 10/11/2023 12:23:15 PM    Physical Exam:   VS:  BP (!) 104/58   Pulse (!) 59   Ht 5\' 6"  (1.676 m)   Wt 191 lb 8 oz (86.9 kg)   SpO2 98%   BMI 30.91 kg/m    Wt Readings from Last 3 Encounters:  10/11/23 191 lb 8 oz (86.9 kg)  03/02/23 193 lb 6.4 oz (87.7 kg)  11/20/22 203 lb (92.1 kg)    GEN: Well nourished, well developed in no acute distress HEENT: Normal, moist mucous membranes NECK: No JVD CARDIAC: regular rhythm, normal S1 and S2, no rubs or gallops. No murmur. VASCULAR: Radial pulse 2+ on right (L radial used in CABG) and DP pulses 2+ bilaterally. No carotid bruits RESPIRATORY:  Clear to auscultation without rales, wheezing or rhonchi  ABDOMEN: Soft, non-tender, non-distended MUSCULOSKELETAL:  Ambulates independently SKIN: Warm and dry, no edema NEUROLOGIC:  Alert and oriented x 3. No focal  neuro deficits noted. PSYCHIATRIC:  Normal affect    ASSESSMENT AND PLAN: .    CAD, s/p CABG x3 2022 (LIMA-LAD, L radial-OM then Ramus as sequential) PCI to RCA 2022 -complicated by post op afib. Of note, there are comments regarding placement of LAA clip, but I do not see this in the formal op note (it is mentioned in the brief op note). On review of her imaging, she does have a LAA clip visualized. -no aspirin as she is on apixaban -lipid management as below -on carvedilol, continuing for now -reviewed red flag warning signs that need immediate medical  attention  History of hypertension, now with hypotension/lightheadedness -stop hydrochlorothiazide and olmesartan -Monitor home blood pressure and lightheaded symptoms. Trying compression stockings if the leg swelling comes back. Will send blood pressure numbers in a few weeks and let us know how the lightheadedness and leg swelling are doing.  Hyperlipidemia -continue rosuvastatin and repatha  History of DVT Chronic venous insuffiency -continue apixaban  CV risk counseling and prevention -recommend heart healthy/Mediterranean diet, with whole grains, fruits, vegetable, fish, lean meats, nuts, and olive oil. Limit salt. -recommend moderate walking, 3-5 times/week for 30-50 minutes each session. Aim for at least 150 minutes.week. Goal should be pace of 3 miles/hours, or walking 1.5 miles in 30 minutes -recommend avoidance of tobacco products. Avoid excess alcohol.  Dispo: 6-8 weeks with APP, or sooner based on BP readings. Follow up with me in 4 mos.  Total time of encounter: I spent 42 minutes dedicated to the care of this patient on the date of this encounter to include pre-visit review of records, face-to-face time with the patient discussing conditions above, and clinical documentation with the electronic health record. We specifically spent time today discussing potential etiologies of her hypotension, review of her outside labs, discussion of medication changes, signs/symptoms to watch for   Signed, Jodelle Red, MD   Jodelle Red, MD, PhD, [facility]  [facility]  [place]  [facility] at [facility] at [facility] [address], [address] [place], [place] [address] [phone]

## 2023-11-02 ENCOUNTER — Encounter (HOSPITAL_BASED_OUTPATIENT_CLINIC_OR_DEPARTMENT_OTHER): Payer: Self-pay

## 2023-11-04 ENCOUNTER — Telehealth: Payer: Self-pay | Admitting: Family

## 2023-11-04 DIAGNOSIS — Z885 Allergy status to narcotic agent status: Secondary | ICD-10-CM | POA: Diagnosis not present

## 2023-11-04 DIAGNOSIS — I252 Old myocardial infarction: Secondary | ICD-10-CM | POA: Diagnosis not present

## 2023-11-04 DIAGNOSIS — Z7901 Long term (current) use of anticoagulants: Secondary | ICD-10-CM | POA: Diagnosis not present

## 2023-11-04 DIAGNOSIS — Z881 Allergy status to other antibiotic agents status: Secondary | ICD-10-CM | POA: Diagnosis not present

## 2023-11-04 DIAGNOSIS — I10 Essential (primary) hypertension: Secondary | ICD-10-CM | POA: Diagnosis not present

## 2023-11-04 DIAGNOSIS — Z7982 Long term (current) use of aspirin: Secondary | ICD-10-CM | POA: Diagnosis not present

## 2023-11-04 DIAGNOSIS — W268XXA Contact with other sharp object(s), not elsewhere classified, initial encounter: Secondary | ICD-10-CM | POA: Diagnosis not present

## 2023-11-04 DIAGNOSIS — Z888 Allergy status to other drugs, medicaments and biological substances status: Secondary | ICD-10-CM | POA: Diagnosis not present

## 2023-11-04 DIAGNOSIS — S8991XA Unspecified injury of right lower leg, initial encounter: Secondary | ICD-10-CM | POA: Diagnosis not present

## 2023-11-04 DIAGNOSIS — Z79899 Other long term (current) drug therapy: Secondary | ICD-10-CM | POA: Diagnosis not present

## 2023-11-04 NOTE — Telephone Encounter (Signed)
Will address at clinic visit.   Mandee Pluta S Daianna Vasques, NP  

## 2023-11-04 NOTE — Telephone Encounter (Signed)
 Ok for telephone visit?

## 2023-11-04 NOTE — Telephone Encounter (Signed)
 FYI update and scheduled to see CW tomorrow 5/2

## 2023-11-04 NOTE — Telephone Encounter (Signed)
 She already sent BP log, okay for phone visit.   Lenzy Kerschner S [place], NP

## 2023-11-04 NOTE — Telephone Encounter (Signed)
 Pt requesting a tele visit for upcoming appt on 11/05/2023. Please advise

## 2023-11-05 ENCOUNTER — Encounter (HOSPITAL_BASED_OUTPATIENT_CLINIC_OR_DEPARTMENT_OTHER): Payer: Self-pay | Admitting: Family

## 2023-11-05 ENCOUNTER — Telehealth (HOSPITAL_BASED_OUTPATIENT_CLINIC_OR_DEPARTMENT_OTHER): Admitting: Family

## 2023-11-05 ENCOUNTER — Telehealth (HOSPITAL_BASED_OUTPATIENT_CLINIC_OR_DEPARTMENT_OTHER): Payer: Self-pay | Admitting: *Deleted

## 2023-11-05 VITALS — BP 140/71 | HR 61 | Ht 66.0 in | Wt 187.9 lb

## 2023-11-05 DIAGNOSIS — I25118 Atherosclerotic heart disease of native coronary artery with other forms of angina pectoris: Secondary | ICD-10-CM | POA: Diagnosis not present

## 2023-11-05 DIAGNOSIS — I10 Essential (primary) hypertension: Secondary | ICD-10-CM | POA: Diagnosis not present

## 2023-11-05 DIAGNOSIS — E785 Hyperlipidemia, unspecified: Secondary | ICD-10-CM | POA: Diagnosis not present

## 2023-11-05 DIAGNOSIS — D6859 Other primary thrombophilia: Secondary | ICD-10-CM | POA: Diagnosis not present

## 2023-11-05 MED ORDER — HYDROCHLOROTHIAZIDE 12.5 MG PO CAPS
12.5000 mg | ORAL_CAPSULE | Freq: Every day | ORAL | 3 refills | Status: DC
Start: 2023-11-05 — End: 2024-03-03

## 2023-11-05 NOTE — Telephone Encounter (Signed)
  Patient Consent for Virtual Visit        Krystal Mcpherson has provided verbal consent on 11/05/2023 for a virtual visit (video or telephone).   CONSENT FOR VIRTUAL VISIT FOR:  Krystal Mcpherson  By participating in this virtual visit I agree to the following:  I hereby voluntarily request, consent and authorize [facility] and its employed or contracted physicians, physician assistants, nurse practitioners or other licensed health care professionals (the Practitioner), to provide me with telemedicine health care services (the "Services") as deemed necessary by the treating Practitioner. I acknowledge and consent to receive the Services by the Practitioner via telemedicine. I understand that the telemedicine visit will involve communicating with the Practitioner through live audiovisual communication technology and the disclosure of certain medical information by electronic transmission. I acknowledge that I have been given the opportunity to request an in-person assessment or other available alternative prior to the telemedicine visit and am voluntarily participating in the telemedicine visit.  I understand that I have the right to withhold or withdraw my consent to the use of telemedicine in the course of my care at any time, without affecting my right to future care or treatment, and that the Practitioner or I may terminate the telemedicine visit at any time. I understand that I have the right to inspect all information obtained and/or recorded in the course of the telemedicine visit and may receive copies of available information for a reasonable fee.  I understand that some of the potential risks of receiving the Services via telemedicine include:  Delay or interruption in medical evaluation due to technological equipment failure or disruption; Information transmitted may not be sufficient (e.g. poor resolution of images) to allow for appropriate medical decision making by the Practitioner; and/or   In rare instances, security protocols could fail, causing a breach of personal health information.  Furthermore, I acknowledge that it is my responsibility to provide information about my medical history, conditions and care that is complete and accurate to the best of my ability. I acknowledge that Practitioner's advice, recommendations, and/or decision may be based on factors not within their control, such as incomplete or inaccurate data provided by me or distortions of diagnostic images or specimens that may result from electronic transmissions. I understand that the practice of medicine is not an exact science and that Practitioner makes no warranties or guarantees regarding treatment outcomes. I acknowledge that a copy of this consent can be made available to me via my patient portal [facility] MyChart), or I can request a printed copy by calling the office of [facility].    I understand that my insurance will be billed for this visit.   I have read or had this consent read to me. I understand the contents of this consent, which adequately explains the benefits and risks of the Services being provided via telemedicine.  I have been provided ample opportunity to ask questions regarding this consent and the Services and have had my questions answered to my satisfaction. I give my informed consent for the services to be provided through the use of telemedicine in my medical care

## 2023-11-05 NOTE — Progress Notes (Signed)
 Virtual Visit via Telephone Note   Because of Krystal Mcpherson co-morbid illnesses, she is at least at moderate risk for complications without adequate follow up.  This format is felt to be most appropriate for this patient at this time.  The patient did not have access to video technology/had technical difficulties with video requiring transitioning to audio format only (telephone).  All issues noted in this document were discussed and addressed.  No physical exam could be performed with this format.  Please refer to the patient's chart for her consent to telehealth for [facility].    Date:  11/05/2023   ID:  Krystal Mcpherson, DOB [birth year], MRN [id number] The patient was identified using 2 identifiers.  Patient Location: Home Provider Location: Office/Clinic   PCP:  Alejandro Hurt, FNP   [facility] Providers Cardiologist:  Sheryle Donning, MD     Evaluation Performed:  Follow-Up Visit  Chief Complaint:  hypertension  History of Present Illness:    Krystal FRACASSI is a 83 y.o. female with history of CAD SP CABG X3 2022, PCI-RCA 2022, DVT 2022 on OAC, HLD.  Last seen 10/11/2023 by Dr. Veryl Gottron noting episodes of hypotension.  Olmesartan  hydrochlorothiazide  were discontinued.  Presents today for follow-up via phone visit.  Notes first week or two felt great and BP readings were down .  After 1.5 weeks had some swelling right above her ankles and hands were puffy in the morning. Notes longstanding history of retaining fluid back to being a teenager. About 2 weeks ago self-resumed half tablet of hydrochlorothiazide  which improved fluid restriction. Notes BP most often at goal and if high will improve quickly.  She is not readings via MyChart with multiple readings at goal less than 130/80.  She did go to the ED yesterday after cutting her leg and being and unable to stop the bleeding, full ED report unavailable.  She feels a little dizzy today which she attributes to  blood loss and we discussed dietary sources of iron.  Past Medical History:  Diagnosis Date   Allergy    Anemia    Hx   Arthritis    neck, lower back   Asthma    Change in bowel habits    CKD (chronic kidney disease), stage III (HCC)    Complication of anesthesia    Coronary artery disease    a. 2012 s/p PCI ->RCA; b. 12/2020 CABG x 3 (LIMA->LAD, L Radial->RI->OM); c. 05/2021 NSTEMI/PCI: LM 95, LAD 100p/m, D1 90, LCX 70p, 100p/m, RCA 10ost, 50p ISR, 95/41m (p/m RCA treated w/ 3.0x34 Onyx Frontier DES), 3/3 patent grafts.   Dyspnea    GERD (gastroesophageal reflux disease)    HLD (hyperlipidemia)    HTN (hypertension)    Migraine    Myocardial infarction (HCC)    Pneumonia    PONV (postoperative nausea and vomiting)    Post-operative atrial fibrillation (HCC)    a. 12/2020 follwing CABG.   SVD (spontaneous vaginal delivery)    x 2   Vitamin B12 deficiency    Past Surgical History:  Procedure Laterality Date   bladder tact     chiara malformation  2001   surgery for decompression   CHOLECYSTECTOMY     CLIPPING OF ATRIAL APPENDAGE  12/04/2020   Procedure: CLIPPING OF LEFT ATRIAL APPENDAGE;  Surgeon: Rudine Cos, MD;  Location: [facility] OR;  Service: Open Heart Surgery;;   COLONOSCOPY  2009    stark   cornary stent Right  05/2011   CORONARY ARTERY BYPASS GRAFT N/A 12/04/2020   Procedure: CORONARY ARTERY BYPASS GRAFTING (CABG), ON PUMP, TIMES THREE, USING LEFT INTERNAL MAMMARY ARTERY AND LEFT RADIAL ARTERY;  Surgeon: Rudine Cos, MD;  Location: [facility] OR;  Service: Open Heart Surgery;  Laterality: N/A;   CORONARY STENT INTERVENTION N/A 05/30/2021   Procedure: CORONARY STENT INTERVENTION;  Surgeon: Millicent Ally, MD;  Location: [facility] INVASIVE CV LAB;  Service: Cardiovascular;  Laterality: N/A;   LEFT HEART CATH AND CORONARY ANGIOGRAPHY N/A 11/28/2020   Procedure: LEFT HEART CATH AND CORONARY ANGIOGRAPHY;  Surgeon: [place], Peter M, MD;  Location: [facility] INVASIVE CV LAB;  Service:  Cardiovascular;  Laterality: N/A;   LEFT HEART CATH AND CORS/GRAFTS ANGIOGRAPHY N/A 05/30/2021   Procedure: LEFT HEART CATH AND CORS/GRAFTS ANGIOGRAPHY;  Surgeon: Millicent Ally, MD;  Location: [facility] INVASIVE CV LAB;  Service: Cardiovascular;  Laterality: N/A;   NASAL SINUS SURGERY     x 2   RADIAL ARTERY HARVEST Left 12/04/2020   Procedure: Left RADIAL ARTERY HARVEST;  Surgeon: Rudine Cos, MD;  Location: [facility] OR;  Service: Open Heart Surgery;  Laterality: Left;   TEE WITHOUT CARDIOVERSION N/A 12/04/2020   Procedure: TRANSESOPHAGEAL ECHOCARDIOGRAM (TEE);  Surgeon: Rudine Cos, MD;  Location: [facility] OR;  Service: Open Heart Surgery;  Laterality: N/A;   TOTAL ABDOMINAL HYSTERECTOMY W/ BILATERAL SALPINGOOPHORECTOMY       Current Meds  Medication Sig   acetaminophen  (TYLENOL ) 500 MG tablet Take 500-1,000 mg by mouth every 6 (six) hours as needed for mild pain (pain).   albuterol  (PROVENTIL  HFA;VENTOLIN  HFA) 108 (90 BASE) MCG/ACT inhaler Inhale 2 puffs into the lungs every 4 (four) hours as needed for wheezing or shortness of breath. Reported on 10/30/2015   apixaban  (ELIQUIS ) 5 MG TABS tablet Take 5 mg by mouth 2 (two) times daily.   carvedilol  (COREG ) 3.125 MG tablet TAKE ONE (1) TABLET BY MOUTH TWO (2) TIMES DAILY   cetirizine (ZYRTEC) 10 MG tablet Take 10 mg by mouth in the morning.   fluticasone  (FLONASE ) 50 MCG/ACT nasal spray Place 1 spray into both nostrils daily.   meclizine (ANTIVERT) 25 MG tablet Take 25 mg by mouth 3 (three) times daily as needed.   metroNIDAZOLE (METROGEL) 0.75 % gel Apply 1 Application topically at bedtime.   nitroGLYCERIN  (NITROSTAT ) 0.4 MG SL tablet Place 1 tablet (0.4 mg total) under the tongue every 5 (five) minutes x 3 doses as needed for chest pain.   REPATHA  SURECLICK 140 MG/ML SOAJ INJECT 140MG  INTO THE SKIN EVERY 14 DAYS   rosuvastatin  (CRESTOR ) 5 MG tablet TAKE ONE (1) TABLET BY MOUTH EVERY DAY     Allergies:   Metronidazole, Atorvastatin, Ciprofloxacin,  Fentanyl , Morphine  and codeine, Tetanus toxoids, and Augmentin  [amoxicillin -pot clavulanate]   Social History   Tobacco Use   Smoking status: Never   Smokeless tobacco: Never  Vaping Use   Vaping status: Never Used  Substance Use Topics   Alcohol use: No    Alcohol/week: 0.0 standard drinks of alcohol   Drug use: No     Family Hx: The patient's family history includes Arthritis in her sister; Breast cancer in her sister; Colon cancer in her paternal grandmother; Heart attack in her father and mother; Hypertension in her father, mother, sister, sister, and sister; Kidney cancer in her sister; Melanoma in her sister; Obesity in her sister; Pancreatitis in her sister; Thyroid  cancer in her sister. There is no history of Esophageal cancer, Rectal cancer, or  Stomach cancer.  ROS:   Please see the history of present illness.     All other systems reviewed and are negative.   Prior CV studies:   The following studies were reviewed today:  Cardiac Studies & Procedures   ______________________________________________________________________________________________ CARDIAC CATHETERIZATION  CARDIAC CATHETERIZATION 05/30/2021  Conclusion   Dist LM to Prox LAD lesion is 95% stenosed.   Prox LAD to Mid LAD lesion is 100% stenosed.   1st Diag lesion is 90% stenosed.   Prox Cx to Mid Cx lesion is 100% stenosed.   Prox RCA lesion is 50% stenosed.   Ost RCA lesion is 10% stenosed.   Mid RCA-1 lesion is 95% stenosed.   Mid RCA-2 lesion is 40% stenosed.   Prox Cx lesion is 70% stenosed.   A drug-eluting stent was successfully placed.   Post intervention, there is a 0% residual stenosis.   Post intervention, there is a 0% residual stenosis.   Post intervention, there is a 0% residual stenosis.  Significant native CAD with total occlusion of the proximal LAD, 70% stenosis in the proximal stenosis prior to the sequential limb of the left radial artery conduit supplying 2 branches.  The  circumflex is unchanged and occluded after the initial branch; and progressive native RCA disease with 50% in-stent narrowing focally within the mid stent followed by 95% stenosis immediately after the distal aspect of the stent followed by 40% stenosis proximal to the acute margin.  Patent sequential left radial artery graft supplying a reported ramus intermediate and distal circumflex marginal branch.  Patent LIMA graft supplying the mid LAD.  LVEDP 8 mmHg.  Successful percutaneous coronary intervention to the RCA with predilatation of the 95% stenosis with a 2.0 x 12 mm balloon and ultimate stenting proximal to the 50% in-stent stenosis region to distally near the acute margin with insertion of a 3.0 x 34 mm Onyx Frontier DES stent postdilated to 3.25 - 3.21 mm.  RECOMMENDATION: DAPT.  In the laboratory the patient was given Brilinta .  If Eliquis  is to be restarted this will need to be changed to clopidogrel  with plans for initial triple drug therapy for 1 month, followed by Plavix /Eliquis .  Optimal blood pressure control and aggressive lipid management.  Findings Coronary Findings Diagnostic  Dominance: Right  Left Main Dist LM to Prox LAD lesion is 95% stenosed.  Left Anterior Descending Prox LAD to Mid LAD lesion is 100% stenosed.  First Diagonal Branch Vessel is small in size. 1st Diag lesion is 90% stenosed.  Left Circumflex Prox Cx lesion is 70% stenosed. Prox Cx to Mid Cx lesion is 100% stenosed.  Right Coronary Artery Ost RCA lesion is 10% stenosed. Prox RCA lesion is 50% stenosed. The lesion was previously treated . Mid RCA-1 lesion is 95% stenosed. Mid RCA-2 lesion is 40% stenosed.  Sequential Graft To 1st Mrg, 3rd Mrg  LIMA Graft To Mid LAD  Intervention  Prox RCA lesion Stent (Also treats lesions: Mid RCA-1, and Mid RCA-2) A drug-eluting stent was successfully placed. Stent strut is well apposed. Post-Intervention Lesion Assessment The intervention was  successful. Pre-interventional TIMI flow is 3. Post-intervention TIMI flow is 3. No complications occurred at this lesion. There is a 0% residual stenosis post intervention.  Mid RCA-1 lesion Stent (Also treats lesions: Prox RCA, and Mid RCA-2) See details in Prox RCA lesion. Post-Intervention Lesion Assessment The intervention was successful. Pre-interventional TIMI flow is 3. Post-intervention TIMI flow is 3. There is a 0% residual stenosis post intervention.  Mid RCA-2 lesion Stent (Also treats lesions: Prox RCA, and Mid RCA-1) See details in Prox RCA lesion. Post-Intervention Lesion Assessment The intervention was successful. Pre-interventional TIMI flow is 3. Post-intervention TIMI flow is 3. No complications occurred at this lesion. There is a 0% residual stenosis post intervention.   CARDIAC CATHETERIZATION  CARDIAC CATHETERIZATION 11/28/2020  Conclusion  Prox LAD to Mid LAD lesion is 90% stenosed.  1st Diag lesion is 80% stenosed.  1st Mrg lesion is 90% stenosed.  Mid Cx lesion is 100% stenosed.  Prox RCA lesion is 30% stenosed.  Prox RCA to Mid RCA lesion is 50% stenosed.  LV end diastolic pressure is mildly elevated.  1. 3 vessel obstructive CAD - 90% proximal to mid LAD, 80% first diagonal - 90% large OM1, 100% LCX post OM1 with right to left collaterals. - Patent stent in the proximal RCA. 50% mid vessel 2. Mildly elevated LVEDP  Plan: recommend CT surgery consult for CABG.  Findings Coronary Findings Diagnostic  Dominance: Right  Left Main The vessel originates from a separate ostium.  Left Anterior Descending Prox LAD to Mid LAD lesion is 90% stenosed. The lesion is located at the bifurcation and segmental.  First Diagonal Branch 1st Diag lesion is 80% stenosed.  First Septal Branch Collaterals 1st Sept filled by collaterals from RPDA.  Left Circumflex Mid Cx lesion is 100% stenosed.  First Obtuse Marginal Branch Vessel is large in  size. 1st Mrg lesion is 90% stenosed.  Second Obtuse Marginal Branch Collaterals 2nd Mrg filled by collaterals from 3rd RPL.  Right Coronary Artery Prox RCA lesion is 30% stenosed. The lesion was previously treated using a drug eluting stent over 2 years ago. Prox RCA to Mid RCA lesion is 50% stenosed.  Intervention  No interventions have been documented.   STRESS TESTS  MYOCARDIAL PERFUSION IMAGING 08/05/2017  Narrative  Nuclear stress EF: 62%.  No T wave inversion was noted during stress.  There was no ST segment deviation noted during stress.  This is a low risk study.  Normal perfusion. LVEF 62% with normal wall motion. This is a low risk study.   ECHOCARDIOGRAM  ECHOCARDIOGRAM COMPLETE 12/21/2022  Narrative ECHOCARDIOGRAM REPORT    Patient Name:   ATIYA ILIFF Date of Exam: 12/21/2022 Medical Rec #:  [id number]   Height:       66.0 in Accession #:    [id number]  Weight:       203.0 lb Date of Birth:  January 11, 1941  BSA:          2.012 m Patient Age:    81 years    BP:           124/60 mmHg Patient Gender: F           HR:           54 bpm. Exam Location:  Outpatient  Procedure: 2D Echo, Cardiac Doppler, Color Doppler and Strain Analysis  Indications:    Edema [782.3.ICD-9-CM]  History:        Patient has prior history of Echocardiogram examinations, most recent 05/30/2021. Previous Myocardial Infarction and CAD, Arrythmias:Atrial Fibrillation, Signs/Symptoms:Dyspnea and Edema; Risk Factors:Hypertension and Dyslipidemia.  Sonographer:    Ruta Cousins RDCS Referring Phys: [id number] TESSA N CONTE   Sonographer Comments: Global longitudinal strain was attempted. IMPRESSIONS   1. Left ventricular ejection fraction, by estimation, is 60 to 65%. The left ventricle has normal function. The left ventricle has no regional wall motion abnormalities. Left ventricular diastolic parameters  are indeterminate. The average left ventricular global longitudinal strain is  -22.0 %. The global longitudinal strain is normal. 2. Right ventricular systolic function is normal. The right ventricular size is normal. There is normal pulmonary artery systolic pressure. The estimated right ventricular systolic pressure is 27.8 mmHg. 3. The mitral valve is normal in structure. Trivial mitral valve regurgitation. No evidence of mitral stenosis. 4. The aortic valve was not well visualized. Aortic valve regurgitation is not visualized. No aortic stenosis is present. 5. The inferior vena cava is normal in size with greater than 50% respiratory variability, suggesting right atrial pressure of 3 mmHg.  FINDINGS Left Ventricle: Left ventricular ejection fraction, by estimation, is 60 to 65%. The left ventricle has normal function. The left ventricle has no regional wall motion abnormalities. The average left ventricular global longitudinal strain is -22.0 %. The global longitudinal strain is normal. The left ventricular internal cavity size was normal in size. There is no left ventricular hypertrophy. Left ventricular diastolic parameters are indeterminate. Normal left ventricular filling pressure.  Right Ventricle: The right ventricular size is normal. No increase in right ventricular wall thickness. Right ventricular systolic function is normal. There is normal pulmonary artery systolic pressure. The tricuspid regurgitant velocity is 2.49 m/s, and with an assumed right atrial pressure of 3 mmHg, the estimated right ventricular systolic pressure is 27.8 mmHg.  Left Atrium: Left atrial size was normal in size.  Right Atrium: Right atrial size was normal in size.  Pericardium: There is no evidence of pericardial effusion.  Mitral Valve: The mitral valve is normal in structure. Trivial mitral valve regurgitation. No evidence of mitral valve stenosis.  Tricuspid Valve: The tricuspid valve is normal in structure. Tricuspid valve regurgitation is mild . No evidence of tricuspid  stenosis.  Aortic Valve: The aortic valve was not well visualized. Aortic valve regurgitation is not visualized. No aortic stenosis is present.  Pulmonic Valve: The pulmonic valve was normal in structure. Pulmonic valve regurgitation is trivial. No evidence of pulmonic stenosis.  Aorta: The aortic root is normal in size and structure.  Venous: The inferior vena cava is normal in size with greater than 50% respiratory variability, suggesting right atrial pressure of 3 mmHg.  IAS/Shunts: No atrial level shunt detected by color flow Doppler.   LEFT VENTRICLE PLAX 2D LVIDd:         4.10 cm     Diastology LVIDs:         2.50 cm     LV e' medial:    5.50 cm/s LV PW:         0.80 cm     LV E/e' medial:  16.1 LV IVS:        0.90 cm     LV e' lateral:   9.56 cm/s LVOT diam:     2.00 cm     LV E/e' lateral: 9.3 LV SV:         74 LV SV Index:   37          2D Longitudinal Strain LVOT Area:     3.14 cm    2D Strain GLS (A2C):   -21.0 % 2D Strain GLS (A3C):   -21.2 % 2D Strain GLS (A4C):   -23.7 % LV Volumes (MOD)           2D Strain GLS Avg:     -22.0 % LV vol d, MOD A2C: 55.9 ml LV vol d, MOD A4C: 70.1 ml LV vol s, MOD A2C:  16.9 ml LV vol s, MOD A4C: 25.0 ml LV SV MOD A2C:     39.0 ml LV SV MOD A4C:     70.1 ml LV SV MOD BP:      42.5 ml  RIGHT VENTRICLE RV S prime:     7.88 cm/s TAPSE (M-mode): 1.2 cm  LEFT ATRIUM             Index        RIGHT ATRIUM           Index LA diam:        4.10 cm 2.04 cm/m   RA Area:     16.20 cm LA Vol (A2C):   39.3 ml 19.53 ml/m  RA Volume:   35.40 ml  17.59 ml/m LA Vol (A4C):   32.8 ml 16.30 ml/m LA Biplane Vol: 36.8 ml 18.29 ml/m AORTIC VALVE             PULMONIC VALVE LVOT Vmax:   88.30 cm/s  PR End Diast Vel: 2.29 msec LVOT Vmean:  57.200 cm/s LVOT VTI:    0.234 m  AORTA Ao Root diam: 3.00 cm Ao Asc diam:  3.30 cm  MITRAL VALVE               TRICUSPID VALVE MV Area (PHT): 3.60 cm    TR Peak grad:   24.8 mmHg MV Decel Time: 211 msec     TR Vmax:        249.00 cm/s MV E velocity: 88.80 cm/s MV A velocity: 91.60 cm/s  SHUNTS MV E/A ratio:  0.97        Systemic VTI:  0.23 m Systemic Diam: 2.00 cm  Gaylyn Keas MD Electronically signed by Gaylyn Keas MD Signature Date/Time: 12/22/2022/7:36:24 AM    Final   TEE  ECHO INTRAOPERATIVE TEE 12/04/2020  Narrative *INTRAOPERATIVE TRANSESOPHAGEAL REPORT *    Patient Name:   MARIENE SIGGERS Date of Exam: 12/04/2020 Medical Rec #:  [id number]   Height:       66.0 in Accession #:    [id number]  Weight:       200.4 lb Date of Birth:  January 14, 1941  BSA:          2.00 m Patient Age:    79 years    BP:           124/69 mmHg Patient Gender: F           HR:           53 bpm. Exam Location:  Inpatient  Transesophogeal exam was perform intraoperatively during surgical procedure. Patient was closely monitored under general anesthesia during the entirety of examination.  Indications:     CABG Performing Phys: Hobart Lulas MD Diagnosing Phys: Hobart Lulas MD  Complications: No known complications during this procedure. POST-OP IMPRESSIONS - Left Ventricle: Lateral wall motion improved post-bypass. - Right Ventricle: The right ventricle appears unchanged from pre-bypass. - Aorta: The aorta appears unchanged from pre-bypass. - Left Atrium: The left atrium appears unchanged from pre-bypass. - Left Atrial Appendage: The left atrial appendage appears unchanged from pre-bypass. - Aortic Valve: The aortic valve appears unchanged from pre-bypass. - Mitral Valve: The mitral valve appears unchanged from pre-bypass. - Tricuspid Valve: The tricuspid valve appears unchanged from pre-bypass. - Pulmonic Valve: The pulmonic valve appears unchanged from pre-bypass. - Interatrial Septum: The interatrial septum appears unchanged from pre-bypass. - Interventricular Septum: The interventricular septum appears unchanged from pre-bypass. - Pericardium: The pericardium  appears unchanged from  pre-bypass.  PRE-OP FINDINGS Left Ventricle: The left ventricle has low normal systolic function, with an ejection fraction of 50-55%. The cavity size was normal. There is no increase in left ventricular wall thickness. There is no left ventricular hypertrophy.   LV Wall Scoring: Lateral LV wall is hypokinetic at mid and mid-apical segments.   Right Ventricle: The right ventricle has normal systolic function. The cavity was normal. There is no increase in right ventricular wall thickness.  Left Atrium: Left atrial size was normal in size. No left atrial/left atrial appendage thrombus was detected.  Right Atrium: Right atrial size was normal in size.  Interatrial Septum: The interatrial septum was not assessed.  Pericardium: There is no evidence of pericardial effusion.  Mitral Valve: The mitral valve is normal in structure. Mitral valve regurgitation is mild by color flow Doppler.  Tricuspid Valve: The tricuspid valve was normal in structure. Tricuspid valve regurgitation is trivial by color flow Doppler.  Aortic Valve: The aortic valve is tricuspid Aortic valve regurgitation was not visualized by color flow Doppler. There is no stenosis of the aortic valve.   Pulmonic Valve: The pulmonic valve was normal in structure. Pulmonic valve regurgitation is trivial by color flow Doppler.   Aorta: The aortic root, ascending aorta and aortic arch are normal in size and structure.   Hobart Lulas MD Electronically signed by Hobart Lulas MD Signature Date/Time: 12/04/2020/1:08:23 PM    Final  MONITORS  LONG TERM MONITOR (3-14 DAYS) 11/21/2018  Narrative  Brief, less than 5 beat runs of supraventricular tachycardia, fastest at 160 bpm, appears to be paroxysmal atrial tachycardia.  No evidence of atrial fibrillation or flutter.  No significant pauses noted.  Occasionally symptoms of heart fluttering are associated with the short SVT lasting 4-5 beats.  Occasional heart  fluttering or skipping could be associated with the short-lived SVT episodes.  These could be conservatively with no further medication as they should be benign.  However, if desired, we can increase the atenolol  from 12.5 mg once a day to 25 mg a day.  This may help calm down some of these skipped beats. Dorothye Gathers, MD       ______________________________________________________________________________________________       Labs/Other Tests and Data Reviewed:    EKG:  No ECG reviewed.  Recent Labs: 11/27/2022: Magnesium  2.2; NT-Pro BNP 824 12/21/2022: BUN 30; Creatinine, Ser 1.27; Potassium 4.6; Sodium 139   Recent Lipid Panel Lab Results  Component Value Date/Time   CHOL 92 (L) 09/22/2021 09:56 AM   TRIG 82 09/22/2021 09:56 AM   HDL 50 09/22/2021 09:56 AM   CHOLHDL 1.8 09/22/2021 09:56 AM   CHOLHDL 3.1 11/29/2020 06:09 AM   LDLCALC 25 09/22/2021 09:56 AM    Wt Readings from Last 3 Encounters:  11/05/23 187 lb 14.4 oz (85.2 kg)  10/11/23 191 lb 8 oz (86.9 kg)  03/02/23 193 lb 6.4 oz (87.7 kg)     Risk Assessment/Calculations:          Objective:    Vital Signs:  BP (!) 140/71   Pulse 61   Ht 5\' 6"  (1.676 m)   Wt 187 lb 14.4 oz (85.2 kg)   BMI 30.33 kg/m    VITAL SIGNS:  reviewed  ASSESSMENT & PLAN:    CAD s/p CABG X3 and PCI RCA 05/2021 - Stable with no anginal symptoms. No indication for ischemic evaluation.  GDMT carvedilol  3.125 mg twice daily, Repatha  140 mg every 14 days, rosuvastatin   5 mg daily. Recommend aiming for 150 minutes of moderate intensity activity per week and following a heart healthy diet.    HLD, LDL goal less than 70-continue Repatha  140 mg every 14 days and Crestor  5 mg daily.  HTN-BP relatively controlled by home readings on hydrochlorothiazide  12.5 mg daily.  Refill provided.  Remain off olmesartan .  Did not tolerate being off hydrochlorothiazide  due to lower extremity edema. Discussed to monitor BP at home at least 2 hours after  medications and sitting for 5-10 minutes.  As has resumed hydrochlorothiazide  12.5mg  daily will request BMET at her upcoming visit with PCP for monitoring of potassium.   History of DVT/hypercoagulable state -continue Eliquis  5 mg twice daily.  ED visit yesterday for bleeding after cutting her leg shaving.     Time:   Today, I have spent 10 minutes with the patient with telehealth technology discussing the above problems.     Medication Adjustments/Labs and Tests Ordered: Current medicines are reviewed at length with the patient today.  Concerns regarding medicines are outlined above.   Tests Ordered: No orders of the defined types were placed in this encounter.   Medication Changes: No orders of the defined types were placed in this encounter.   Follow Up:  In Person  in August as scheduled  Signed, Clearnce Curia, NP  11/05/2023 3:26 PM    [facility] 2022 and PCI-RCA 05/2021 re

## 2023-11-05 NOTE — Patient Instructions (Addendum)
 Medication Instructions:   Continue Hydrochlorothiazide  12.5mg  daily.  We sent updated prescription for 12.5mg  tablets to your pharmacy. You may use up your 25mg  tablets by taking half tablet once per day.   *If you need a refill on your cardiac medications before your next appointment, please call your pharmacy*  Lab Work: Recommend monitoring of potassium. We will request BMET (basic metabolic panel) at your upcoming visit with primary care. If they are unable to collect, we've mailed you lab slips and you can go to any [facility] or come to our office.   If you have labs (blood work) drawn today and your tests are completely normal, you will receive your results only by: MyChart Message (if you have MyChart) OR A paper copy in the mail If you have any lab test that is abnormal or we need to change your treatment, we will call you to review the results.  Follow-Up: At [facility], you and your health needs are our priority.  As part of our continuing mission to provide you with exceptional heart care, our providers are all part of one team.  This team includes your primary Cardiologist (physician) and Advanced Practice Providers or APPs (Physician Assistants and Nurse Practitioners) who all work together to provide you with the care you need, when you need it.  Your next appointment:   As scheduled in August with Dr. Veryl Gottron  We recommend signing up for the patient portal called "MyChart".  Sign up information is provided on this After Visit Summary.  MyChart is used to connect with patients for Virtual Visits (Telemedicine).  Patients are able to view lab/test results, encounter notes, upcoming appointments, etc.  Non-urgent messages can be sent to your provider as well.   To learn more about what you can do with MyChart, go to [email or web address].   Other Instructions  Recommend adding dietary iron after a bleeding event.   Iron-Rich Diet  Iron is a mineral that  helps your body produce hemoglobin. Hemoglobin is a protein in red blood cells that carries oxygen to your body's tissues. Eating too little iron may cause you to feel weak and tired, and it can increase your risk of infection. Iron is naturally found in many foods, and many foods have iron added to them (are iron-fortified). You may need to follow an iron-rich diet if you do not have enough iron in your body due to certain medical conditions. The amount of iron that you need each day depends on your age, your sex, and any medical conditions you have. Follow instructions from your health care provider or a dietitian about how much iron you should eat each day. What are tips for following this plan?  Meal planning When you eat foods that contain iron, you should eat them with foods that are high in vitamin C. These include oranges, peppers, tomatoes, potatoes, and mangoes. Vitamin C helps your body absorb iron. Certain foods and drinks prevent your body from absorbing iron properly. Avoid eating these foods in the same meal as iron-rich foods or with iron supplements. These foods include: Coffee, black tea, and red wine. Milk, dairy products, and foods that are high in calcium . Beans and soybeans. Whole grains.  What foods should I eat? Fruits Prunes. Raisins. Eat fruits high in vitamin C, such as oranges, grapefruits, and strawberries, with iron-rich foods. Vegetables Spinach (cooked). Green peas. Broccoli. Fermented vegetables. Eat vegetables high in vitamin C, such as leafy greens, potatoes, bell peppers, and tomatoes, with  iron-rich foods. Grains Iron-fortified breakfast cereal. Iron-fortified whole-wheat bread. Enriched rice. Sprouted grains. Meats and other proteins Beef liver. Beef. [place]. Chicken. Oysters. Shrimp. Tuna. Sardines. Chickpeas. Nuts. Tofu. Pumpkin seeds. Beverages Tomato juice. Fresh orange juice. Prune juice. Hibiscus tea. Iron-fortified instant breakfast shakes. Sweets  and desserts Blackstrap molasses. Seasonings and condiments Tahini. Fermented soy sauce. Other foods Wheat germ. The items listed above may not be a complete list of recommended foods and beverages. Contact a dietitian for more information.  Summary Iron is a mineral that helps your body produce hemoglobin. Hemoglobin is a protein in red blood cells that carries oxygen to your body's tissues. Iron is naturally found in many foods, and many foods have iron added to them (are iron-fortified). When you eat foods that contain iron, you should eat them with foods that are high in vitamin C. Vitamin C helps your body absorb iron.

## 2023-11-15 ENCOUNTER — Telehealth: Payer: Self-pay | Admitting: Cardiology

## 2023-11-15 NOTE — Telephone Encounter (Signed)
No SBE prophylaxis required.   Alver Sorrow, NP

## 2023-11-15 NOTE — Telephone Encounter (Signed)
 Returned call to patient, patient verbalizes understanding.

## 2023-11-15 NOTE — Telephone Encounter (Signed)
**Note De-identified  [place] Obfuscation** Please advise 

## 2023-11-15 NOTE — Telephone Encounter (Signed)
 Pt called in asking if she needs to have antibiotics prior to dental cleaning. Please advise.

## 2023-11-24 DIAGNOSIS — R7303 Prediabetes: Secondary | ICD-10-CM | POA: Diagnosis not present

## 2023-11-24 DIAGNOSIS — I2510 Atherosclerotic heart disease of native coronary artery without angina pectoris: Secondary | ICD-10-CM | POA: Diagnosis not present

## 2023-11-24 DIAGNOSIS — Z6831 Body mass index (BMI) 31.0-31.9, adult: Secondary | ICD-10-CM | POA: Diagnosis not present

## 2023-11-24 DIAGNOSIS — D6869 Other thrombophilia: Secondary | ICD-10-CM | POA: Diagnosis not present

## 2023-11-24 DIAGNOSIS — E559 Vitamin D deficiency, unspecified: Secondary | ICD-10-CM | POA: Diagnosis not present

## 2023-11-24 DIAGNOSIS — I5030 Unspecified diastolic (congestive) heart failure: Secondary | ICD-10-CM | POA: Diagnosis not present

## 2023-11-24 DIAGNOSIS — I25118 Atherosclerotic heart disease of native coronary artery with other forms of angina pectoris: Secondary | ICD-10-CM | POA: Diagnosis not present

## 2023-11-24 DIAGNOSIS — E785 Hyperlipidemia, unspecified: Secondary | ICD-10-CM | POA: Diagnosis not present

## 2023-11-24 DIAGNOSIS — T466X5A Adverse effect of antihyperlipidemic and antiarteriosclerotic drugs, initial encounter: Secondary | ICD-10-CM | POA: Diagnosis not present

## 2023-11-24 DIAGNOSIS — E538 Deficiency of other specified B group vitamins: Secondary | ICD-10-CM | POA: Diagnosis not present

## 2023-11-24 DIAGNOSIS — Z0000 Encounter for general adult medical examination without abnormal findings: Secondary | ICD-10-CM | POA: Diagnosis not present

## 2023-11-24 DIAGNOSIS — D649 Anemia, unspecified: Secondary | ICD-10-CM | POA: Diagnosis not present

## 2023-11-24 DIAGNOSIS — G720 Drug-induced myopathy: Secondary | ICD-10-CM | POA: Diagnosis not present

## 2023-11-24 DIAGNOSIS — I10 Essential (primary) hypertension: Secondary | ICD-10-CM | POA: Diagnosis not present

## 2023-12-07 ENCOUNTER — Other Ambulatory Visit: Payer: Self-pay | Admitting: Cardiology

## 2023-12-07 DIAGNOSIS — I25118 Atherosclerotic heart disease of native coronary artery with other forms of angina pectoris: Secondary | ICD-10-CM

## 2023-12-07 DIAGNOSIS — E785 Hyperlipidemia, unspecified: Secondary | ICD-10-CM

## 2024-01-10 ENCOUNTER — Other Ambulatory Visit: Payer: Self-pay | Admitting: Obstetrics and Gynecology

## 2024-01-10 DIAGNOSIS — Z6831 Body mass index (BMI) 31.0-31.9, adult: Secondary | ICD-10-CM | POA: Diagnosis not present

## 2024-01-10 DIAGNOSIS — Z1231 Encounter for screening mammogram for malignant neoplasm of breast: Secondary | ICD-10-CM

## 2024-01-10 DIAGNOSIS — M79605 Pain in left leg: Secondary | ICD-10-CM | POA: Diagnosis not present

## 2024-01-21 ENCOUNTER — Other Ambulatory Visit: Payer: Self-pay | Admitting: Cardiology

## 2024-01-25 DIAGNOSIS — Z20822 Contact with and (suspected) exposure to covid-19: Secondary | ICD-10-CM | POA: Diagnosis not present

## 2024-01-25 DIAGNOSIS — J069 Acute upper respiratory infection, unspecified: Secondary | ICD-10-CM | POA: Diagnosis not present

## 2024-01-28 DIAGNOSIS — H6692 Otitis media, unspecified, left ear: Secondary | ICD-10-CM | POA: Diagnosis not present

## 2024-01-28 DIAGNOSIS — R062 Wheezing: Secondary | ICD-10-CM | POA: Diagnosis not present

## 2024-01-28 DIAGNOSIS — J029 Acute pharyngitis, unspecified: Secondary | ICD-10-CM | POA: Diagnosis not present

## 2024-01-28 DIAGNOSIS — R059 Cough, unspecified: Secondary | ICD-10-CM | POA: Diagnosis not present

## 2024-01-28 DIAGNOSIS — R051 Acute cough: Secondary | ICD-10-CM | POA: Diagnosis not present

## 2024-02-02 DIAGNOSIS — R059 Cough, unspecified: Secondary | ICD-10-CM | POA: Diagnosis not present

## 2024-02-02 DIAGNOSIS — I10 Essential (primary) hypertension: Secondary | ICD-10-CM | POA: Diagnosis not present

## 2024-02-05 ENCOUNTER — Other Ambulatory Visit: Payer: Self-pay | Admitting: Cardiology

## 2024-02-09 ENCOUNTER — Ambulatory Visit

## 2024-02-15 ENCOUNTER — Ambulatory Visit (HOSPITAL_BASED_OUTPATIENT_CLINIC_OR_DEPARTMENT_OTHER): Admitting: Cardiology

## 2024-02-15 ENCOUNTER — Encounter (HOSPITAL_BASED_OUTPATIENT_CLINIC_OR_DEPARTMENT_OTHER): Payer: Self-pay | Admitting: Cardiology

## 2024-02-15 VITALS — BP 136/72 | HR 61 | Ht 66.0 in | Wt 192.7 lb

## 2024-02-15 DIAGNOSIS — E785 Hyperlipidemia, unspecified: Secondary | ICD-10-CM | POA: Diagnosis not present

## 2024-02-15 DIAGNOSIS — I10 Essential (primary) hypertension: Secondary | ICD-10-CM

## 2024-02-15 DIAGNOSIS — I2510 Atherosclerotic heart disease of native coronary artery without angina pectoris: Secondary | ICD-10-CM

## 2024-02-15 DIAGNOSIS — Z7901 Long term (current) use of anticoagulants: Secondary | ICD-10-CM

## 2024-02-15 DIAGNOSIS — Z86718 Personal history of other venous thrombosis and embolism: Secondary | ICD-10-CM | POA: Diagnosis not present

## 2024-02-15 DIAGNOSIS — D6859 Other primary thrombophilia: Secondary | ICD-10-CM | POA: Diagnosis not present

## 2024-02-15 DIAGNOSIS — Z951 Presence of aortocoronary bypass graft: Secondary | ICD-10-CM

## 2024-02-15 NOTE — Progress Notes (Signed)
 Cardiology Office Note:  .   Date:  02/15/2024  ID:  RICKELL WIEHE, DOB 01/10/1941, MRN [id number] PCP: Marvene Prentice SAUNDERS, FNP  [facility] Providers Cardiologist:  Shelda Bruckner, MD {  History of Present Illness: .   Krystal Mcpherson is a 83 y.o. female with PMH CAD s/p CABG x3 2022, PCI to RCA 2022, prior DVT 2022 on anticoagulation, hyperlipidemia. She was previously seen by Dr. Jeffrie and established care with me on 10/11/23.  Pertinent CV history: NSTEMI 11/2020, cardiac cath showing patent RCA stent with 30% stenosis, mid RCA 50% stenosis, mid LAD 90% stenosis in the ostium of the diagonal and 80% stenosis, 90% stenosis in the first obtuse marginal coronary artery and mid circumflex totally occluded with right to left collaterals. Echo with LVEF 55 to 60%, no significant valvular abnormalities. Carotid duplex showed no evidence of carotid artery disease bilaterally. She did require Plavix  without washout and underwent CABG times 3 on 12/04/20 with Dr. Latisha. Had atrial fibrillation postop. 11/22 underwent stenting of the RCA due to angina   Today: Has been sick for 3 weeks, thought to be allergic reaction to cats. Has been on prednisone and inhalers, recently started singulair. Using rescue inhaler 1-2 times/day. Shortness of breath/chest tightness much improved.  At last visit, was having issues with hypotension/lightheadedness, stopped olmesartan  and hydrochlorothiazide . Blood pressure was ok for a while, but then increased, gradually went back on her medications. Confirmed she is on carvedilol  3.125 mg BID, hydrochlorothiazide  12.5 mg daily, olmesartan  40 mg daily.   Has LE edema chronically, compression stockings didn't help, hydrochlorothiazide  manages well.   ROS: Denies chest pain, shortness of breath at rest or with normal exertion. No PND, orthopnea, or unexpected weight gain. No syncope or palpitations. ROS otherwise negative except as noted.   Studies Reviewed: SABRA    EKG:        Physical Exam:   VS:  BP 136/72   Pulse 61   Ht 5' 6 (1.676 m)   Wt 192 lb 11.2 oz (87.4 kg)   SpO2 96%   BMI 31.10 kg/m    Wt Readings from Last 3 Encounters:  02/15/24 192 lb 11.2 oz (87.4 kg)  11/05/23 187 lb 14.4 oz (85.2 kg)  10/11/23 191 lb 8 oz (86.9 kg)    GEN: Well nourished, well developed in no acute distress HEENT: Normal, moist mucous membranes NECK: No JVD CARDIAC: regular rhythm, normal S1 and S2, no rubs or gallops. No murmur. VASCULAR: Radial pulse 2+ on right (L radial used in CABG) and DP pulses 2+ bilaterally. No carotid bruits RESPIRATORY:  Clear to auscultation without rales, wheezing or rhonchi  ABDOMEN: Soft, non-tender, non-distended MUSCULOSKELETAL:  Ambulates independently SKIN: Warm and dry, no edema NEUROLOGIC:  Alert and oriented x 3. No focal neuro deficits noted. PSYCHIATRIC:  Normal affect    ASSESSMENT AND PLAN: .    CAD, s/p CABG x3 2022 (LIMA-LAD, L radial-OM then Ramus as sequential) PCI to RCA 2022 -complicated by post op afib. On review of her imaging, she does have a LAA clip visualized. -no aspirin  as she is on apixaban  -lipid management as below -on carvedilol , continuing for now -reviewed red flag warning signs that need immediate medical attention  History of hypertension, with history of hypotension/lightheadedness -was 100s/50s, very lightheaded in 10/2023; cut hydrochlorothiazide  and olmesartan , did well for a while but then BP increased -now back on hydrochlorothiazide  12.5 mg daily, doing well with this dose -back on olmesartan  40  mg daily. She will monitor at home, contact us  if running high/low -continue carvedilol  3.125 mg BID  Hyperlipidemia -continue rosuvastatin  and repatha   History of DVT Chronic venous insuffiency -continue apixaban  -did not tolerate compression stockings  CV risk counseling and prevention -recommend heart healthy/Mediterranean diet, with whole grains, fruits, vegetable, fish, lean  meats, nuts, and olive oil. Limit salt. -recommend moderate walking, 3-5 times/week for 30-50 minutes each session. Aim for at least 150 minutes.week. Goal should be pace of 3 miles/hours, or walking 1.5 miles in 30 minutes -recommend avoidance of tobacco products. Avoid excess alcohol.  Dispo: 6 mos with me or APP  Signed, Shelda Bruckner, MD   Shelda Bruckner, MD, PhD, [facility] [place]  [facility]  [place]  [facility] at [facility] [address] [place], [place] [address] [phone]

## 2024-02-15 NOTE — Patient Instructions (Addendum)
  Follow-Up: At [facility], you and your health needs are our priority.  As part of our continuing mission to provide you with exceptional heart care, our providers are all part of one team.  This team includes your primary Cardiologist (physician) and Advanced Practice Providers or APPs (Physician Assistants and Nurse Practitioners) who all work together to provide you with the care you need, when you need it.  Your next appointment:   6 month(s)  Provider:   Shelda Bruckner, MD    MONITOR BLOOD PRESSURES AND ADVISE   Blood Pressure Record Sheet To take your blood pressure, you will need a blood pressure machine. You can buy a blood pressure machine (blood pressure monitor) at your clinic, drug store, or online. When choosing one, consider: An automatic monitor that has an arm cuff. A cuff that wraps snugly around your upper arm. You should be able to fit only one finger between your arm and the cuff. A device that stores blood pressure reading results. Do not choose a monitor that measures your blood pressure from your wrist or finger. Follow your health care provider's instructions for how to take your blood pressure. To use this form: Take your blood pressure medications every day These measurements should be taken when you have been at rest for at least 10-15 min Take at least 2 readings with each blood pressure check. This makes sure the results are correct. Wait 1-2 minutes between measurements. Write down the results in the spaces on this form. Keep in mind it should always be recorded systolic over diastolic. Both numbers are important.  Repeat this every day for 2-3 weeks, or as told by your health care provider.  Make a follow-up appointment with your health care provider to discuss the results.  Blood Pressure Log Date Medications taken? (Y/N) Blood Pressure Time of Day

## 2024-03-01 ENCOUNTER — Ambulatory Visit
Admission: RE | Admit: 2024-03-01 | Discharge: 2024-03-01 | Disposition: A | Discharge status: Home / Self Care | Source: Ambulatory Visit | Attending: Obstetrics and Gynecology | Admitting: Obstetrics and Gynecology

## 2024-03-01 DIAGNOSIS — Z1231 Encounter for screening mammogram for malignant neoplasm of breast: Secondary | ICD-10-CM

## 2024-03-03 ENCOUNTER — Telehealth: Payer: Self-pay | Admitting: Cardiology

## 2024-03-03 ENCOUNTER — Other Ambulatory Visit: Payer: Self-pay

## 2024-03-03 ENCOUNTER — Other Ambulatory Visit: Payer: Self-pay | Admitting: Cardiology

## 2024-03-03 DIAGNOSIS — I10 Essential (primary) hypertension: Secondary | ICD-10-CM

## 2024-03-03 MED ORDER — HYDROCHLOROTHIAZIDE 12.5 MG PO CAPS
12.5000 mg | ORAL_CAPSULE | Freq: Every day | ORAL | 3 refills | Status: AC
Start: 2024-03-03 — End: ?

## 2024-03-03 NOTE — Telephone Encounter (Signed)
 Pt c/o medication issue:  1. Name of Medication: olmesartan  (BENICAR ) 40 MG tablet  hydrochlorothiazide  (MICROZIDE ) 12.5 MG capsule   2. How are you currently taking this medication (dosage and times per day)?   TAKE ONE (1) TABLET BY MOUTH EVERY DAY   Take 1 capsule (12.5 mg total) by mouth daily. (Pt only taking half capsule of hydrochlorothiazide )  3. Are you having a reaction (difficulty breathing--STAT)? No  4. What is your medication issue? Due to Dr. Lonni asking for BP readings, pt would like to know if Dr. Lonni would still like her to still be taking medication as directed, and if pt there is no change, would like a refill on medications. Sent to pharmacy in [place], [place]; both for 90 day supply  Dr. Lonni also asked pt to send in a log of BP readings:  Average 133/70 until last week and this week Fluctuated from 120-160 systolic with irregular heartbeat 168/82 this morning right after medication 138/73 today a few hours after medication

## 2024-03-03 NOTE — Telephone Encounter (Signed)
 Increased stress, had to put her dog down yesterday  139/74 2 hours ago  Only 1 day with blood pressure dropping since she saw Dr Lonni  Hydrochlorothiazide  1/2 tablet now  Olmesartan  40 mg daily   Would like to continue with current regimen   Blood pressure machine shows irregular HR and had for few days, none today or yesterday She could feel it beating irregular   Advised to continue to monitor both and call back if any issues with either   Will continue current medications and will forward to Dr Lonni for review

## 2024-03-15 DIAGNOSIS — N898 Other specified noninflammatory disorders of vagina: Secondary | ICD-10-CM | POA: Diagnosis not present

## 2024-03-15 DIAGNOSIS — R350 Frequency of micturition: Secondary | ICD-10-CM | POA: Diagnosis not present

## 2024-03-20 NOTE — Telephone Encounter (Signed)
 Lonni Slain, MD to Me     03/20/24 11:11 AM Agree, no changes at this time.

## 2024-04-12 DIAGNOSIS — X32XXXD Exposure to sunlight, subsequent encounter: Secondary | ICD-10-CM | POA: Diagnosis not present

## 2024-04-12 DIAGNOSIS — D225 Melanocytic nevi of trunk: Secondary | ICD-10-CM | POA: Diagnosis not present

## 2024-04-12 DIAGNOSIS — L570 Actinic keratosis: Secondary | ICD-10-CM | POA: Diagnosis not present

## 2024-04-12 DIAGNOSIS — Z1283 Encounter for screening for malignant neoplasm of skin: Secondary | ICD-10-CM | POA: Diagnosis not present

## 2024-05-24 DIAGNOSIS — I5030 Unspecified diastolic (congestive) heart failure: Secondary | ICD-10-CM | POA: Diagnosis not present

## 2024-05-24 DIAGNOSIS — E785 Hyperlipidemia, unspecified: Secondary | ICD-10-CM | POA: Diagnosis not present

## 2024-05-24 DIAGNOSIS — D6869 Other thrombophilia: Secondary | ICD-10-CM | POA: Diagnosis not present

## 2024-05-24 DIAGNOSIS — I25118 Atherosclerotic heart disease of native coronary artery with other forms of angina pectoris: Secondary | ICD-10-CM | POA: Diagnosis not present

## 2024-05-24 DIAGNOSIS — R7303 Prediabetes: Secondary | ICD-10-CM | POA: Diagnosis not present

## 2024-05-24 DIAGNOSIS — K760 Fatty (change of) liver, not elsewhere classified: Secondary | ICD-10-CM | POA: Diagnosis not present

## 2024-05-24 DIAGNOSIS — I10 Essential (primary) hypertension: Secondary | ICD-10-CM | POA: Diagnosis not present

## 2024-05-24 DIAGNOSIS — N1831 Chronic kidney disease, stage 3a: Secondary | ICD-10-CM | POA: Diagnosis not present

## 2024-05-24 DIAGNOSIS — I2510 Atherosclerotic heart disease of native coronary artery without angina pectoris: Secondary | ICD-10-CM | POA: Diagnosis not present

## 2024-05-24 DIAGNOSIS — R059 Cough, unspecified: Secondary | ICD-10-CM | POA: Diagnosis not present

## 2024-06-08 DIAGNOSIS — M1712 Unilateral primary osteoarthritis, left knee: Secondary | ICD-10-CM | POA: Diagnosis not present

## 2024-06-08 DIAGNOSIS — M170 Bilateral primary osteoarthritis of knee: Secondary | ICD-10-CM | POA: Diagnosis not present

## 2024-06-08 DIAGNOSIS — M1711 Unilateral primary osteoarthritis, right knee: Secondary | ICD-10-CM | POA: Diagnosis not present

## 2024-06-13 ENCOUNTER — Emergency Department (HOSPITAL_BASED_OUTPATIENT_CLINIC_OR_DEPARTMENT_OTHER)
Admission: EM | Admit: 2024-06-13 | Discharge: 2024-06-13 | Disposition: A | Discharge status: Home / Self Care | Attending: Emergency Medicine | Admitting: Emergency Medicine

## 2024-06-13 ENCOUNTER — Emergency Department (HOSPITAL_BASED_OUTPATIENT_CLINIC_OR_DEPARTMENT_OTHER): Admitting: Radiology

## 2024-06-13 ENCOUNTER — Other Ambulatory Visit: Payer: Self-pay

## 2024-06-13 ENCOUNTER — Encounter (HOSPITAL_BASED_OUTPATIENT_CLINIC_OR_DEPARTMENT_OTHER): Payer: Self-pay | Admitting: Emergency Medicine

## 2024-06-13 ENCOUNTER — Telehealth: Payer: Self-pay | Admitting: Student

## 2024-06-13 DIAGNOSIS — R079 Chest pain, unspecified: Secondary | ICD-10-CM | POA: Diagnosis not present

## 2024-06-13 DIAGNOSIS — I10 Essential (primary) hypertension: Secondary | ICD-10-CM

## 2024-06-13 LAB — BASIC METABOLIC PANEL WITH GFR
Anion gap: 8 (ref 5–15)
BUN: 30 mg/dL — ABNORMAL HIGH (ref 8–23)
CO2: 31 mmol/L (ref 22–32)
Calcium: 10.5 mg/dL — ABNORMAL HIGH (ref 8.9–10.3)
Chloride: 103 mmol/L (ref 98–111)
Creatinine, Ser: 1.27 mg/dL — ABNORMAL HIGH (ref 0.44–1.00)
GFR, Estimated: 42 mL/min — ABNORMAL LOW (ref >60)
Glucose, Bld: 103 mg/dL — ABNORMAL HIGH (ref 70–99)
Potassium: 4.4 mmol/L (ref 3.5–5.1)
Sodium: 142 mmol/L (ref 135–145)

## 2024-06-13 LAB — CBC
HCT: 44.6 % (ref 36.0–46.0)
Hemoglobin: 14.4 g/dL (ref 12.0–15.0)
MCH: 27.3 pg (ref 26.0–34.0)
MCHC: 32.3 g/dL (ref 30.0–36.0)
MCV: 84.5 fL (ref 80.0–100.0)
Platelets: 216 K/uL (ref 150–400)
RBC: 5.28 MIL/uL — ABNORMAL HIGH (ref 3.87–5.11)
RDW: 13.8 % (ref 11.5–15.5)
WBC: 7.2 K/uL (ref 4.0–10.5)
nRBC: 0 % (ref 0.0–0.2)

## 2024-06-13 LAB — TROPONIN T, HIGH SENSITIVITY: Troponin T High Sensitivity: 19 ng/L (ref 0–19)

## 2024-06-13 MED ORDER — CARVEDILOL 6.25 MG PO TABS
3.1250 mg | ORAL_TABLET | Freq: Once | ORAL | Status: AC
  Administered 2024-06-13: 3.125 mg via ORAL
  Filled 2024-06-13: qty 1

## 2024-06-13 MED ORDER — IRBESARTAN 150 MG PO TABS
300.0000 mg | ORAL_TABLET | Freq: Once | ORAL | Status: AC
  Administered 2024-06-13: 300 mg via ORAL
  Filled 2024-06-13: qty 2

## 2024-06-13 NOTE — Discharge Instructions (Signed)
 Krystal Mcpherson  Thank you for allowing us  to take care of you today.  You came to the Emergency Department today because had elevated blood pressures at home.  You have had some pains in your back, however reports that you have had similar pains in your back previously that are unchanged, as well as some headaches, however reports that you have also had similar headaches previously and these are not new since your blood pressure has been higher.  Here in the emergency department we got blood work and you are not having any elevation of your kidney number, your heart number.  You are not having any shortness of breath, visual changes, confusion, or difficulty moving or feeling your body, therefore your symptoms are not consistent with having a emergency related to your blood pressure.  We do recommend that you follow-up with your primary care doctor as well as Dr. Lonni for further evaluation to see if you would benefit from adjustments of your blood pressure medications.  We are putting in a referral for you to see Dr. Lonni in clinic.  They should call you to schedule follow-up, if you do not hear from them within 3 days you can call them at [phone].   To-Do: 1. Please follow-up with your primary doctor within 7 days / as soon as possible.   Please return to the Emergency Department or call 911 if you experience have worsening of your symptoms, or do not get better, new or different chest pain, shortness of breath, severe or significantly worsening pain, high fever, severe confusion, pass out or have any reason to think that you need emergency medical care.   We hope you feel better soon.   Mitzie Later, MD Department of Emergency Medicine [facility]

## 2024-06-13 NOTE — Telephone Encounter (Signed)
   Patient called after-hours Answering Service with concerns about elevated BP.  She states her systolic BP has been running consistently in the 190s since Saturday night. She has a history of hypertension but has also had some issues with hypotension and lightheadedness in the past per chart review.  Her current medications include Olmesartan  40 mg daily, HCTZ 12.5 mg daily, and Coreg  3.125 mg twice daily.  She is compliant with these medications.  She took an extra dose of Coreg  around noon today and that did help some. Her systolic BP dropped to the 140s with this. However, BP is now back up.  She is currently having pain in between her shoulder blades as well as pain under her ribs and an intermittent headache.  Most recent systolic BP this evening was in the 170s to 180s.  She has a history of CAD s/p CABG but no history of aortic dissection/aneurysm.  Given her current symptoms in setting of markedly elevated BP, I recommended she go to the ED further evaluation.  Patient voiced understanding and agreed.  Advised patient not to drive herself.  She is also going to need a close follow-up visit. Will send a message to the triage pool to see if they can help with this.  Vivia Rosenburg E Danijela Vessey, PA-C 06/13/2024 7:22 PM

## 2024-06-13 NOTE — ED Triage Notes (Signed)
 High BP since Sunday 198/90s C/ o headache ( intermittent ok now) Some pain under shoulder blade  (none right now) Hasn't been able to sleep

## 2024-06-13 NOTE — ED Provider Notes (Signed)
 [facility] AT [facility] Provider Note   CSN: [id number] Arrival date & time: 06/13/24  2056     History Chief Complaint  Patient presents with   Hypertension    HPI: Krystal Mcpherson is a 83 y.o. female with history pertinent for HTN, bradycardia, NSTEMI, prior CABG, CAD, CKD, HLD, A-fib on Eliquis , prior DVT who presents complaining of elevated blood pressure. Patient arrived via POV accompanied by son, Krystal Mcpherson.  History provided by patient.  No interpreter required during this encounter.  Patient presents to the emergency department for elevated blood pressures.  Reports that she was having difficulty sleeping on the evening of 12/7 to 12/8, thus decided to take her blood pressure, noted that it was elevated to the 190s/80s.  Reports that she took a extra half of her olmesartan , however has been continued to check her blood pressures, and has had persistent elevations to the 180s and 190s.  Reports that that she has had intermittent pain under her left shoulder blade, however reports that she typically gets this, and attributes it to her bad spine.  Reports that it is not a new or different symptoms.  Also reports that she intermittently has had a sharp bifrontal headache that last for several minutes and resolved spontaneously.  Reports that she has also had these intermittent headaches chronically, and it is not a new or different symptoms since she noticed her elevated blood pressure.  Denies any current chest pain/back pain, denies current headache.  Denies difficulty moving or feeling her body, denies confusion, visual changes, shortness of breath, changes in urination.  Reports that she has been attempting to schedule a appointment with her PCP, Dr. Lonni to review her blood pressure medications, however reports that she has not been able to schedule an appointment as of yet.  Reports that she called the triage line for her PCP office this evening, and  received a callback from someone who instructed her to go to the emergency department for further evaluation.  Patient's recorded medical, surgical, social, medication list and allergies were reviewed in the Snapshot window as part of the initial history.   Prior to Admission medications   Medication Sig Start Date End Date Taking? Authorizing Provider  acetaminophen  (TYLENOL ) 500 MG tablet Take 500-1,000 mg by mouth every 6 (six) hours as needed for mild pain (pain).    [provider]  albuterol  (PROVENTIL  HFA;VENTOLIN  HFA) 108 (90 BASE) MCG/ACT inhaler Inhale 2 puffs into the lungs every 4 (four) hours as needed for wheezing or shortness of breath. Reported on 10/30/2015    [provider]  apixaban  (ELIQUIS ) 5 MG TABS tablet Take 5 mg by mouth 2 (two) times daily.    [provider]  carvedilol  (COREG ) 3.125 MG tablet TAKE ONE (1) TABLET BY MOUTH TWO (2) TIMES DAILY 01/21/24   Walker, Caitlin S, NP  cetirizine (ZYRTEC) 10 MG tablet Take 10 mg by mouth in the morning.    [provider]  Evolocumab  (REPATHA  SURECLICK) 140 MG/ML SOAJ INJECT 140MG  INTO THE SKIN EVERY 14 DAYS 12/08/23   Jeffrie Oneil BROCKS, MD  fluticasone  (FLONASE ) 50 MCG/ACT nasal spray Place 1 spray into both nostrils daily.    [provider]  hydrochlorothiazide  (MICROZIDE ) 12.5 MG capsule Take 1 capsule (12.5 mg total) by mouth daily. 03/03/24   Lonni Slain, MD  meclizine (ANTIVERT) 25 MG tablet Take 25 mg by mouth 3 (three) times daily as needed. 09/22/23   [provider]  metroNIDAZOLE (METROGEL) 0.75 % gel Apply 1 Application topically at bedtime. 09/21/23   [provider]  montelukast (SINGULAIR) 10 MG tablet Take 10 mg by mouth at bedtime. 02/02/24   [provider]  nitroGLYCERIN  (NITROSTAT ) 0.4 MG SL tablet Place 1 tablet (0.4 mg total) under the tongue every 5 (five) minutes x 3 doses as needed for chest pain. 05/31/21   Vivienne Lonni Ingle,  NP  olmesartan  (BENICAR ) 40 MG tablet TAKE ONE (1) TABLET BY MOUTH EVERY DAY 03/03/24   Lonni Slain, MD  rosuvastatin  (CRESTOR ) 5 MG tablet TAKE ONE (1) TABLET BY MOUTH EVERY DAY 02/07/24   Jeffrie Oneil BROCKS, MD     Allergies: Metronidazole, Atorvastatin, Ciprofloxacin, Fentanyl , Morphine  and codeine, Tetanus toxoid-containing vaccines, and Augmentin  [amoxicillin -pot clavulanate]   Review of Systems   ROS as per HPI  Physical Exam Updated Vital Signs BP (!) 153/66   Pulse (!) 51   Temp 98.2 F (36.8 C)   Resp 18   SpO2 99%  Physical Exam Vitals and nursing note reviewed.  Constitutional:      General: She is not in acute distress.    Appearance: Normal appearance. She is well-developed.  HENT:     Head: Normocephalic and atraumatic.  Eyes:     Extraocular Movements: Extraocular movements intact.     Conjunctiva/sclera: Conjunctivae normal.  Cardiovascular:     Rate and Rhythm: Normal rate and regular rhythm.     Pulses: Normal pulses.     Heart sounds: No murmur heard. Pulmonary:     Effort: Pulmonary effort is normal. No respiratory distress.     Breath sounds: Normal breath sounds.  Abdominal:     Palpations: Abdomen is soft.     Tenderness: There is no abdominal tenderness.  Musculoskeletal:        General: No swelling.     Cervical back: Neck supple.     Right lower leg: No edema.     Left lower leg: No edema.  Skin:    General: Skin is warm and dry.     Capillary Refill: Capillary refill takes less than 2 seconds.  Neurological:     General: No focal deficit present.     Mental Status: She is alert and oriented to person, place, and time.  Psychiatric:        Mood and Affect: Mood normal.     ED Course/ Medical Decision Making/ A&P    Procedures Procedures   Medications Ordered in ED Medications  carvedilol  (COREG ) tablet 3.125 mg (3.125 mg Oral Given 06/13/24 2223)  irbesartan  (AVAPRO ) tablet 300 mg (300 mg Oral Given 06/13/24 2223)     Medical Decision Making:   [name] is a 83 y.o. female who presents for hypertension as per above.  Physical exam is pertinent for no focal abnormalities.   The differential includes but is not limited to hypertension, hypertensive emergency, ICH, PRES, ACS, aortic dissection, pulmonary edema, AKI, retinal hemorrhage.  Independent historian: None  External data reviewed: Labs: reviewed prior labs for baseline  Initial Plan:  Screening labs including CBC and Metabolic panel to evaluate for infectious or metabolic etiology of disease.  Chest x-ray to evaluate for structural/infectious intra-thoracic pathology.  EKG and single troponin to evaluate for cardiac pathology. Single troponin appropriate due to greater than 6 hours since maximal intensity of symptoms. Objective evaluation as below reviewed   Labs: Ordered, Independent interpretation, and Details: Troponin reassuring at 19, BMP with creatinine at baseline, no emergent electrolyte  derangement.  CBC without leukocytosis, anemia, thrombocytopenia  Radiology: Ordered, Independent interpretation, Details: Chest x-ray without focal airspace opacification, cardiomediastinal silhouette derangement, pneumothorax, pleural effusion, bony derangement, and All images reviewed independently.  Agree with radiology report at this time.   DG Chest 2 View Result Date: 06/13/2024 EXAM: 2 VIEW(S) XRAY OF THE CHEST 06/13/2024 09:29:00 PM COMPARISON: 05/29/2021 CLINICAL HISTORY: chest pain, chest pain FINDINGS: LUNGS AND PLEURA: No focal pulmonary opacity. No pleural effusion. No pneumothorax. HEART AND MEDIASTINUM: Left atrium appendage occlusive device noted. No acute abnormality of the cardiac and mediastinal silhouettes. BONES AND SOFT TISSUES: Prior median sternotomy. No acute osseous abnormality. IMPRESSION: 1. No acute cardiopulmonary process. Electronically signed by: Franky Crease MD 06/13/2024 09:33 PM EST RP Workstation: [id number]     EKG/Medicine tests: Ordered and Independent interpretation EKG Interpretation: Sinus bradycardia Cannot rule out Anterior infarct , age undetermined Abnormal ECG When compared with ECG of 11-Oct-2023 11:43, No significant change was found Confirmed by Rogelia Satterfield ([id number]) on 06/13/2024 10:19:56 PM  Interventions: Home Coreg , irbesartan  (formulary equivalent of home olmesartan )  See the EMR for full details regarding lab and imaging results.  Patient presents to the emergency department for elevated blood pressure.  Patient reports that she does have intermittent headaches, none currently, as well as intermittent back pains, none currently, however states that these are chronic conditions that predate the patient's current concerns with her blood pressure.  Reports that she is primarily here due to concerns regarding the blood pressure and being advised by her physicians on-call triage line to come to the emergency department for further evaluation.  Patient does not have any focal neurologic derangements, no confusion, does not currently have headache, does not report that headache is the most intense headache that she has ever had, reports that they are similar to her prior headaches, therefore feel that CT head would be of low yield.  Discussed risk and benefits of obtaining CT head, patient also feels comfortable with deferring CT head at this time.  Symptoms not consistent with aortic dissection, patient with 2+ pulses, no mediastinal silhouette enlargement on chest x-ray.  Patient's lungs CTAB, saturating well on room air, therefore doubt pulmonary edema.  Additionally patient without ongoing chest pain, troponin reassuring, doubt ACS, creatinine at baseline, therefore doubt AKI.  Workup is reassuring against hypertensive emergency.  Patient was given equivalent of home medications and did have some improvement in her blood pressure, patient remained asymptomatic while under my care, do feel  that patient requires close follow-up with PCP and cardiologist (patient follows with Dr. Lonni who patient states is the primary manager of her antihypertensives), will place referral back to cardiology for close follow-up.  Patient expresses agreement with this plan, discharged in stable condition.  Presentation is most consistent with acute complicated illness, Current presentation is complicated by underlying chronic conditions, and I did consider and rule out acute life/limb-threatening illness  Discussion of management or test interpretations with external provider(s): Not indicated  Risk Drugs:Prescription drug management  Disposition: DISCHARGE: I believe that the patient is safe for discharge home with outpatient follow-up. Patient was informed of all pertinent physical exam, laboratory, and imaging findings. Patient's suspected etiology of their symptom presentation was discussed with the patient and all questions were answered. We discussed following up with PCP and cardiology. I provided thorough ED return precautions. The patient feels safe and comfortable with this plan.  MDM generated using voice dictation software and may contain dictation errors.  Please contact me for any  clarification or with any questions.  Clinical Impression:  1. Hypertension, unspecified type      Discharge   Final Clinical Impression(s) / ED Diagnoses Final diagnoses:  Hypertension, unspecified type    Rx / DC Orders ED Discharge Orders          Ordered    Ambulatory referral to Cardiology       Comments: If you have not heard from the Cardiology office within the next 72 hours please call [phone].   06/13/24 2303             Rogelia Jerilynn RAMAN, MD 06/14/24 0002

## 2024-06-14 DIAGNOSIS — I10 Essential (primary) hypertension: Secondary | ICD-10-CM | POA: Diagnosis not present

## 2024-06-14 NOTE — Telephone Encounter (Signed)
 Called patient and let her know when her follow up is.  This day/time works for her and she has seen [place] previously.  Thanked me for call.

## 2024-06-16 ENCOUNTER — Encounter (HOSPITAL_BASED_OUTPATIENT_CLINIC_OR_DEPARTMENT_OTHER): Payer: Self-pay

## 2024-06-16 ENCOUNTER — Encounter (HOSPITAL_BASED_OUTPATIENT_CLINIC_OR_DEPARTMENT_OTHER): Payer: Self-pay | Admitting: Family

## 2024-06-16 ENCOUNTER — Ambulatory Visit (HOSPITAL_BASED_OUTPATIENT_CLINIC_OR_DEPARTMENT_OTHER): Admitting: Family

## 2024-06-16 VITALS — BP 142/58 | HR 60 | Ht 66.0 in | Wt 194.4 lb

## 2024-06-16 DIAGNOSIS — I10 Essential (primary) hypertension: Secondary | ICD-10-CM | POA: Diagnosis not present

## 2024-06-16 DIAGNOSIS — D6859 Other primary thrombophilia: Secondary | ICD-10-CM | POA: Diagnosis not present

## 2024-06-16 DIAGNOSIS — E785 Hyperlipidemia, unspecified: Secondary | ICD-10-CM

## 2024-06-16 DIAGNOSIS — I25118 Atherosclerotic heart disease of native coronary artery with other forms of angina pectoris: Secondary | ICD-10-CM

## 2024-06-16 MED ORDER — AMLODIPINE BESYLATE 5 MG PO TABS: 5.0000 mg | ORAL_TABLET | Freq: Every evening | ORAL | 1 refills | Status: AC

## 2024-06-16 NOTE — Patient Instructions (Addendum)
 Labs today catecholamines, metanephrines, thyroid  panel No hypokalemia suggestive of hyperaldosteronism.  Medication Instructions:   START Amlodipine 5mg  every evening  *If you need a refill on your cardiac medications before your next appointment, please call your pharmacy*  Lab Work: Your physician recommends that you return for lab work today: catecholamines, metanephrines, thyroid  panel  If you have labs (blood work) drawn today and your tests are completely normal, you will receive your results only by: MyChart Message (if you have MyChart) OR A paper copy in the mail If you have any lab test that is abnormal or we need to change your treatment, we will call you to review the results.  Testing/Procedures: Your physician has requested that you have a renal artery duplex. During this test, an ultrasound is used to evaluate blood flow to the kidneys. Allow one hour for this exam. Do not eat after midnight the day before and avoid carbonated beverages. Take your medications as you usually do.   Follow-Up: At [facility], you and your health needs are our priority.  As part of our continuing mission to provide you with exceptional heart care, our providers are all part of one team.  This team includes your primary Cardiologist (physician) and Advanced Practice Providers or APPs (Physician Assistants and Nurse Practitioners) who all work together to provide you with the care you need, when you need it.  Your next appointment:   6-8 week(s)  Provider:   Shelda Bruckner, MD, Rosaline Bane, NP, or Reche Finder, NP    We recommend signing up for the patient portal called MyChart.  Sign up information is provided on this After Visit Summary.  MyChart is used to connect with patients for Virtual Visits (Telemedicine).  Patients are able to view lab/test results, encounter notes, upcoming appointments, etc.  Non-urgent messages can be sent to your provider as well.   To  learn more about what you can do with MyChart, go to [email or web address].   Other Instructions  We will check in on blood pressure via MyChart in 2 weeks

## 2024-06-16 NOTE — Progress Notes (Signed)
 Cardiology Office Note   Date:  06/16/2024  ID:  Krystal, Mcpherson 30-Mar-1941, MRN [id number] PCP: Marvene Prentice SAUNDERS, FNP  [facility] Providers Cardiologist:  Shelda Bruckner, MD     History of Present Illness Krystal Mcpherson is a 83 y.o. female with history of CAD SP CABG X3 2022, PCI-RCA 2022, DVT 2022 on OAC, HLD.  ED visit 06/13/24 with hypertension despite home regimen of Olmesartan  40 mg daily, HCTZ 12.5 mg daily, and Coreg  3.125 mg twice daily. She was given irbesartan  and carvedilol  in the ED. BMP stable renal function, CBC no anemia, troponin T unremarkable. EKG was SB with no acute ST/T wave changes.   Hydralazine  previously stopped due to hypotension.   Presents today for follow up. Reports Saturday evening she could not sleep, got up and checked her blood pressure and it was elevated up to 190s. Had some elevated readings previously which she attributed to some injections in her knees. She is frustrated by phone system trying to reach someone to get an appointment. Frustrated on BP upon leaving ED was still >200. Reports her BP has gradually decreased to the 170s Wednesday/Thursday of this week and today when checked at home was 144.   Reports no lightheadedness, dizziness. Notes several times this week her BP monitor has shown an irregular heart rate, more frequent than previous. Heart rate at home usually 50-60bpm. Reports no changed LE edema. Does get some LLE swelling improved with compression stockings due to varicose veins.   ROS: Please see the history of present illness.    All other systems reviewed and are negative.   Studies Reviewed      Cardiac Studies & Procedures   ______________________________________________________________________________________________ CARDIAC CATHETERIZATION  CARDIAC CATHETERIZATION 05/30/2021  Conclusion   Dist LM to Prox LAD lesion is 95% stenosed.   Prox LAD to Mid LAD lesion is 100% stenosed.   1st Diag lesion is 90%  stenosed.   Prox Cx to Mid Cx lesion is 100% stenosed.   Prox RCA lesion is 50% stenosed.   Ost RCA lesion is 10% stenosed.   Mid RCA-1 lesion is 95% stenosed.   Mid RCA-2 lesion is 40% stenosed.   Prox Cx lesion is 70% stenosed.   A drug-eluting stent was successfully placed.   Post intervention, there is a 0% residual stenosis.   Post intervention, there is a 0% residual stenosis.   Post intervention, there is a 0% residual stenosis.  Significant native CAD with total occlusion of the proximal LAD, 70% stenosis in the proximal stenosis prior to the sequential limb of the left radial artery conduit supplying 2 branches.  The circumflex is unchanged and occluded after the initial branch; and progressive native RCA disease with 50% in-stent narrowing focally within the mid stent followed by 95% stenosis immediately after the distal aspect of the stent followed by 40% stenosis proximal to the acute margin.  Patent sequential left radial artery graft supplying a reported ramus intermediate and distal circumflex marginal branch.  Patent LIMA graft supplying the mid LAD.  LVEDP 8 mmHg.  Successful percutaneous coronary intervention to the RCA with predilatation of the 95% stenosis with a 2.0 x 12 mm balloon and ultimate stenting proximal to the 50% in-stent stenosis region to distally near the acute margin with insertion of a 3.0 x 34 mm Onyx Frontier DES stent postdilated to 3.25 - 3.21 mm.  RECOMMENDATION: DAPT.  In the laboratory the patient was given Brilinta .  If Eliquis  is to  be restarted this will need to be changed to clopidogrel  with plans for initial triple drug therapy for 1 month, followed by Plavix /Eliquis .  Optimal blood pressure control and aggressive lipid management.  Findings Coronary Findings Diagnostic  Dominance: Right  Left Main Dist LM to Prox LAD lesion is 95% stenosed.  Left Anterior Descending Prox LAD to Mid LAD lesion is 100% stenosed.  First Diagonal  Branch Vessel is small in size. 1st Diag lesion is 90% stenosed.  Left Circumflex Prox Cx lesion is 70% stenosed. Prox Cx to Mid Cx lesion is 100% stenosed.  Right Coronary Artery Ost RCA lesion is 10% stenosed. Prox RCA lesion is 50% stenosed. The lesion was previously treated . Mid RCA-1 lesion is 95% stenosed. Mid RCA-2 lesion is 40% stenosed.  Sequential Graft To 1st Mrg, 3rd Mrg  LIMA Graft To Mid LAD  Intervention  Prox RCA lesion Stent (Also treats lesions: Mid RCA-1, and Mid RCA-2) A drug-eluting stent was successfully placed. Stent strut is well apposed. Post-Intervention Lesion Assessment The intervention was successful. Pre-interventional TIMI flow is 3. Post-intervention TIMI flow is 3. No complications occurred at this lesion. There is a 0% residual stenosis post intervention.  Mid RCA-1 lesion Stent (Also treats lesions: Prox RCA, and Mid RCA-2) See details in Prox RCA lesion. Post-Intervention Lesion Assessment The intervention was successful. Pre-interventional TIMI flow is 3. Post-intervention TIMI flow is 3. There is a 0% residual stenosis post intervention.  Mid RCA-2 lesion Stent (Also treats lesions: Prox RCA, and Mid RCA-1) See details in Prox RCA lesion. Post-Intervention Lesion Assessment The intervention was successful. Pre-interventional TIMI flow is 3. Post-intervention TIMI flow is 3. No complications occurred at this lesion. There is a 0% residual stenosis post intervention.   CARDIAC CATHETERIZATION  CARDIAC CATHETERIZATION 11/28/2020  Conclusion  Prox LAD to Mid LAD lesion is 90% stenosed.  1st Diag lesion is 80% stenosed.  1st Mrg lesion is 90% stenosed.  Mid Cx lesion is 100% stenosed.  Prox RCA lesion is 30% stenosed.  Prox RCA to Mid RCA lesion is 50% stenosed.  LV end diastolic pressure is mildly elevated.  1. 3 vessel obstructive CAD - 90% proximal to mid LAD, 80% first diagonal - 90% large OM1, 100% LCX post OM1 with  right to left collaterals. - Patent stent in the proximal RCA. 50% mid vessel 2. Mildly elevated LVEDP  Plan: recommend CT surgery consult for CABG.  Findings Coronary Findings Diagnostic  Dominance: Right  Left Main The vessel originates from a separate ostium.  Left Anterior Descending Prox LAD to Mid LAD lesion is 90% stenosed. The lesion is located at the bifurcation and segmental.  First Diagonal Branch 1st Diag lesion is 80% stenosed.  First Septal Branch Collaterals 1st Sept filled by collaterals from RPDA.  Left Circumflex Mid Cx lesion is 100% stenosed.  First Obtuse Marginal Branch Vessel is large in size. 1st Mrg lesion is 90% stenosed.  Second Obtuse Marginal Branch Collaterals 2nd Mrg filled by collaterals from 3rd RPL.  Right Coronary Artery Prox RCA lesion is 30% stenosed. The lesion was previously treated using a drug eluting stent over 2 years ago. Prox RCA to Mid RCA lesion is 50% stenosed.  Intervention  No interventions have been documented.   STRESS TESTS  MYOCARDIAL PERFUSION IMAGING 08/05/2017  Interpretation Summary  Nuclear stress EF: 62%.  No T wave inversion was noted during stress.  There was no ST segment deviation noted during stress.  This is a low risk study.  Normal perfusion. LVEF 62% with normal wall motion. This is a low risk study.   ECHOCARDIOGRAM  ECHOCARDIOGRAM COMPLETE 12/21/2022  Narrative ECHOCARDIOGRAM REPORT    Patient Name:   RAGEN LAVER Date of Exam: 12/21/2022 Medical Rec #:  [id number]   Height:       66.0 in Accession #:    [id number]  Weight:       203.0 lb Date of Birth:  [birth year]  BSA:          2.012 m Patient Age:    81 years    BP:           124/60 mmHg Patient Gender: F           HR:           54 bpm. Exam Location:  Outpatient  Procedure: 2D Echo, Cardiac Doppler, Color Doppler and Strain Analysis  Indications:    Edema [782.3.ICD-9-CM]  History:        Patient has prior history of  Echocardiogram examinations, most recent 05/30/2021. Previous Myocardial Infarction and CAD, Arrythmias:Atrial Fibrillation, Signs/Symptoms:Dyspnea and Edema; Risk Factors:Hypertension and Dyslipidemia.  Sonographer:    Lauraine Pilot RDCS Referring Phys: [phone] TESSA N CONTE   Sonographer Comments: Global longitudinal strain was attempted. IMPRESSIONS   1. Left ventricular ejection fraction, by estimation, is 60 to 65%. The left ventricle has normal function. The left ventricle has no regional wall motion abnormalities. Left ventricular diastolic parameters are indeterminate. The average left ventricular global longitudinal strain is -22.0 %. The global longitudinal strain is normal. 2. Right ventricular systolic function is normal. The right ventricular size is normal. There is normal pulmonary artery systolic pressure. The estimated right ventricular systolic pressure is 27.8 mmHg. 3. The mitral valve is normal in structure. Trivial mitral valve regurgitation. No evidence of mitral stenosis. 4. The aortic valve was not well visualized. Aortic valve regurgitation is not visualized. No aortic stenosis is present. 5. The inferior vena cava is normal in size with greater than 50% respiratory variability, suggesting right atrial pressure of 3 mmHg.  FINDINGS Left Ventricle: Left ventricular ejection fraction, by estimation, is 60 to 65%. The left ventricle has normal function. The left ventricle has no regional wall motion abnormalities. The average left ventricular global longitudinal strain is -22.0 %. The global longitudinal strain is normal. The left ventricular internal cavity size was normal in size. There is no left ventricular hypertrophy. Left ventricular diastolic parameters are indeterminate. Normal left ventricular filling pressure.  Right Ventricle: The right ventricular size is normal. No increase in right ventricular wall thickness. Right ventricular systolic function is normal.  There is normal pulmonary artery systolic pressure. The tricuspid regurgitant velocity is 2.49 m/s, and with an assumed right atrial pressure of 3 mmHg, the estimated right ventricular systolic pressure is 27.8 mmHg.  Left Atrium: Left atrial size was normal in size.  Right Atrium: Right atrial size was normal in size.  Pericardium: There is no evidence of pericardial effusion.  Mitral Valve: The mitral valve is normal in structure. Trivial mitral valve regurgitation. No evidence of mitral valve stenosis.  Tricuspid Valve: The tricuspid valve is normal in structure. Tricuspid valve regurgitation is mild . No evidence of tricuspid stenosis.  Aortic Valve: The aortic valve was not well visualized. Aortic valve regurgitation is not visualized. No aortic stenosis is present.  Pulmonic Valve: The pulmonic valve was normal in structure. Pulmonic valve regurgitation is trivial. No evidence of pulmonic stenosis.  Aorta: The aortic root is  normal in size and structure.  Venous: The inferior vena cava is normal in size with greater than 50% respiratory variability, suggesting right atrial pressure of 3 mmHg.  IAS/Shunts: No atrial level shunt detected by color flow Doppler.   LEFT VENTRICLE PLAX 2D LVIDd:         4.10 cm     Diastology LVIDs:         2.50 cm     LV e' medial:    5.50 cm/s LV PW:         0.80 cm     LV E/e' medial:  16.1 LV IVS:        0.90 cm     LV e' lateral:   9.56 cm/s LVOT diam:     2.00 cm     LV E/e' lateral: 9.3 LV SV:         74 LV SV Index:   37          2D Longitudinal Strain LVOT Area:     3.14 cm    2D Strain GLS (A2C):   -21.0 % 2D Strain GLS (A3C):   -21.2 % 2D Strain GLS (A4C):   -23.7 % LV Volumes (MOD)           2D Strain GLS Avg:     -22.0 % LV vol d, MOD A2C: 55.9 ml LV vol d, MOD A4C: 70.1 ml LV vol s, MOD A2C: 16.9 ml LV vol s, MOD A4C: 25.0 ml LV SV MOD A2C:     39.0 ml LV SV MOD A4C:     70.1 ml LV SV MOD BP:      42.5 ml  RIGHT  VENTRICLE RV S prime:     7.88 cm/s TAPSE (M-mode): 1.2 cm  LEFT ATRIUM             Index        RIGHT ATRIUM           Index LA diam:        4.10 cm 2.04 cm/m   RA Area:     16.20 cm LA Vol (A2C):   39.3 ml 19.53 ml/m  RA Volume:   35.40 ml  17.59 ml/m LA Vol (A4C):   32.8 ml 16.30 ml/m LA Biplane Vol: 36.8 ml 18.29 ml/m AORTIC VALVE             PULMONIC VALVE LVOT Vmax:   88.30 cm/s  PR End Diast Vel: 2.29 msec LVOT Vmean:  57.200 cm/s LVOT VTI:    0.234 m  AORTA Ao Root diam: 3.00 cm Ao Asc diam:  3.30 cm  MITRAL VALVE               TRICUSPID VALVE MV Area (PHT): 3.60 cm    TR Peak grad:   24.8 mmHg MV Decel Time: 211 msec    TR Vmax:        249.00 cm/s MV E velocity: 88.80 cm/s MV A velocity: 91.60 cm/s  SHUNTS MV E/A ratio:  0.97        Systemic VTI:  0.23 m Systemic Diam: 2.00 cm  Wilbert Bihari MD Electronically signed by Wilbert Bihari MD Signature Date/Time: 12/22/2022/7:36:24 AM    Final   TEE  ECHO INTRAOPERATIVE TEE 12/04/2020  Narrative *INTRAOPERATIVE TRANSESOPHAGEAL REPORT *    Patient Name:   RAYNA BRENNER Date of Exam: 12/04/2020 Medical Rec #:  [id number]   Height:  66.0 in Accession #:    [id number]  Weight:       200.4 lb Date of Birth:  10-09-1940  BSA:          2.00 m Patient Age:    79 years    BP:           124/69 mmHg Patient Gender: F           HR:           53 bpm. Exam Location:  Inpatient  Transesophogeal exam was perform intraoperatively during surgical procedure. Patient was closely monitored under general anesthesia during the entirety of examination.  Indications:     CABG Performing Phys: Debby Like MD Diagnosing Phys: Debby Like MD  Complications: No known complications during this procedure. POST-OP IMPRESSIONS - Left Ventricle: Lateral wall motion improved post-bypass. - Right Ventricle: The right ventricle appears unchanged from pre-bypass. - Aorta: The aorta appears unchanged from pre-bypass. - Left Atrium:  The left atrium appears unchanged from pre-bypass. - Left Atrial Appendage: The left atrial appendage appears unchanged from pre-bypass. - Aortic Valve: The aortic valve appears unchanged from pre-bypass. - Mitral Valve: The mitral valve appears unchanged from pre-bypass. - Tricuspid Valve: The tricuspid valve appears unchanged from pre-bypass. - Pulmonic Valve: The pulmonic valve appears unchanged from pre-bypass. - Interatrial Septum: The interatrial septum appears unchanged from pre-bypass. - Interventricular Septum: The interventricular septum appears unchanged from pre-bypass. - Pericardium: The pericardium appears unchanged from pre-bypass.  PRE-OP FINDINGS Left Ventricle: The left ventricle has low normal systolic function, with an ejection fraction of 50-55%. The cavity size was normal. There is no increase in left ventricular wall thickness. There is no left ventricular hypertrophy.   LV Wall Scoring: Lateral LV wall is hypokinetic at mid and mid-apical segments.   Right Ventricle: The right ventricle has normal systolic function. The cavity was normal. There is no increase in right ventricular wall thickness.  Left Atrium: Left atrial size was normal in size. No left atrial/left atrial appendage thrombus was detected.  Right Atrium: Right atrial size was normal in size.  Interatrial Septum: The interatrial septum was not assessed.  Pericardium: There is no evidence of pericardial effusion.  Mitral Valve: The mitral valve is normal in structure. Mitral valve regurgitation is mild by color flow Doppler.  Tricuspid Valve: The tricuspid valve was normal in structure. Tricuspid valve regurgitation is trivial by color flow Doppler.  Aortic Valve: The aortic valve is tricuspid Aortic valve regurgitation was not visualized by color flow Doppler. There is no stenosis of the aortic valve.   Pulmonic Valve: The pulmonic valve was normal in structure. Pulmonic valve regurgitation  is trivial by color flow Doppler.   Aorta: The aortic root, ascending aorta and aortic arch are normal in size and structure.   Debby Like MD Electronically signed by Debby Like MD Signature Date/Time: 12/04/2020/1:08:23 PM    Final  MONITORS  LONG TERM MONITOR (3-14 DAYS) 11/21/2018  Narrative  Brief, less than 5 beat runs of supraventricular tachycardia, fastest at 160 bpm, appears to be paroxysmal atrial tachycardia.  No evidence of atrial fibrillation or flutter.  No significant pauses noted.  Occasionally symptoms of heart fluttering are associated with the short SVT lasting 4-5 beats.  Occasional heart fluttering or skipping could be associated with the short-lived SVT episodes.  These could be conservatively with no further medication as they should be benign.  However, if desired, we can increase the atenolol  from 12.5 mg once a  day to 25 mg a day.  This may help calm down some of these skipped beats. Oneil Parchment, MD       ______________________________________________________________________________________________      Risk Assessment/Calculations  CHA2DS2-VASc Score = 5   This indicates a 7.2% annual risk of stroke. The patient's score is based upon: CHF History: 0 HTN History: 1 Diabetes History: 0 Stroke History: 0 Vascular Disease History: 1 Age Score: 2 Gender Score: 1           Physical Exam VS:  BP (!) 142/58   Pulse 60   Ht 5' 6 (1.676 m)   Wt 194 lb 6.4 oz (88.2 kg)   SpO2 94%   BMI 31.38 kg/m        Wt Readings from Last 3 Encounters:  06/16/24 194 lb 6.4 oz (88.2 kg)  02/15/24 192 lb 11.2 oz (87.4 kg)  11/05/23 187 lb 14.4 oz (85.2 kg)    GEN: Well nourished, well developed in no acute distress NECK: No JVD; No carotid bruits CARDIAC: RRR, no murmurs, rubs, gallops RESPIRATORY:  Clear to auscultation without rales, wheezing or rhonchi  ABDOMEN: Soft, non-tender, non-distended EXTREMITIES:  No edema; No deformity    ASSESSMENT AND PLAN  CAD s/p CABG X3 and PCI RCA 05/2021 - Stable with no anginal symptoms. No indication for ischemic evaluation.  GDMT carvedilol  3.125 mg twice daily, Repatha  140 mg every 14 days, rosuvastatin  5 mg daily. Recommend aiming for 150 minutes of moderate intensity activity per week and following a heart healthy diet.     HLD, LDL goal less than 70-continue Repatha  140 mg every 14 days and Crestor  5 mg daily.   HTN- Sudden recent escalation in BP, unclear cause, requiring ED visit.  Continue carvedilol  3.125 mg twice daily, HCTZ 12.5 mg every morning, olmesartan  40 mg every morning.  Add amlodipine 5 mg nightly Discussed to monitor BP at home at least 2 hours after medications and sitting for 5-10 minutes.  Secondary hypertension workup 2018 normal adrenals Plan for renal artery duplex Labs today thyroid  panel, catecholamines, metanephrines to exclude thyroid  disease or pheochromocytoma as contributory No hypokalemia suggestive of hyperaldosteronism MyChart message in 2 weeks to check in on Bp   History of DVT/hypercoagulable state -continue Eliquis  5 mg twice daily.  No bleeding complications.        Dispo: follow up in 6-8 weeks  Signed, Reche GORMAN Finder, NP

## 2024-06-23 ENCOUNTER — Ambulatory Visit (HOSPITAL_BASED_OUTPATIENT_CLINIC_OR_DEPARTMENT_OTHER): Payer: Self-pay | Admitting: Family

## 2024-06-23 DIAGNOSIS — I10 Essential (primary) hypertension: Secondary | ICD-10-CM

## 2024-06-23 DIAGNOSIS — Z79899 Other long term (current) drug therapy: Secondary | ICD-10-CM

## 2024-06-23 LAB — THYROID PANEL WITH TSH
Free Thyroxine Index: 2.1 (ref 1.2–4.9)
T3 Uptake Ratio: 28 % (ref 24–39)
T4, Total: 7.4 ug/dL (ref 4.5–12.0)
TSH: 1.58 u[IU]/mL (ref 0.450–4.500)

## 2024-06-23 LAB — METANEPHRINES, PLASMA
Metanephrine, Free: <25 pg/mL (ref 0.0–88.0)
Normetanephrine, Free: 122.3 pg/mL (ref 0.0–297.2)

## 2024-06-23 LAB — CATECHOLAMINES, FRACTIONATED, PLASMA
Dopamine: 11.1 pg/mL (ref 0.0–36.7)
Epinephrine: <10 pg/mL (ref 0.0–55.4)
Norepinephrine: 821 pg/mL — ABNORMAL HIGH (ref 115–524)

## 2024-06-23 NOTE — Telephone Encounter (Signed)
-----   Message from Reche Finder, NP sent at 06/23/2024  1:55 PM EST ----- Normal thyroid , metanephrines.  Catecholamines mildly elevated. Recommend 24h urine catecholamines for further evaluation. This will allow us  to rule out pheochromocytoma as causing her high blood pressure.

## 2024-06-23 NOTE — Telephone Encounter (Signed)
 The patient has been notified of the result and verbalized understanding.  All questions (if any) were answered.  Pt aware we will order for her to get 24 hour urine for catecholamines done.  Pt aware she can pick her urine jug up here at our [facility] and take home to start doing the 24 hour urine collection.  She is aware after the 24 hour collection, she will need to drop her urine sample back off to the lab for processing.   Pt aware of lab hours/holiday hours.   Pt aware I will send her the 24 hour urine collection instructions to her mychart account to refer to.   Pt verbalized understanding and agrees with this plan.

## 2024-06-27 ENCOUNTER — Encounter (HOSPITAL_BASED_OUTPATIENT_CLINIC_OR_DEPARTMENT_OTHER): Payer: Self-pay

## 2024-06-28 NOTE — Telephone Encounter (Signed)
 Pt reviewed recommendations on mychart. Future mychart message to be delivered in 1 week sent to check in on BP after medication change.   Krystal Saran S Laresha Bacorn, NP

## 2024-07-08 LAB — CATECHOLAMINES, FRACTIONATED, URINE, 24 HOUR
Dopamine , 24H Ur: 80 ug/(24.h) (ref 0–510)
Dopamine, Rand Ur: 40 ug/L
Epinephrine, 24H Ur: <6 ug/(24.h) (ref 0–20)
Epinephrine, Rand Ur: <3 ug/L
Norepinephrine, 24H Ur: 38 ug/(24.h) (ref 0–135)
Norepinephrine, Rand Ur: 19 ug/L

## 2024-07-26 ENCOUNTER — Ambulatory Visit (INDEPENDENT_AMBULATORY_CARE_PROVIDER_SITE_OTHER)

## 2024-07-26 DIAGNOSIS — I10 Essential (primary) hypertension: Secondary | ICD-10-CM

## 2024-08-01 ENCOUNTER — Ambulatory Visit (HOSPITAL_BASED_OUTPATIENT_CLINIC_OR_DEPARTMENT_OTHER): Admitting: Family

## 2024-08-18 ENCOUNTER — Ambulatory Visit (HOSPITAL_BASED_OUTPATIENT_CLINIC_OR_DEPARTMENT_OTHER): Admitting: Family
